# Patient Record
Sex: Female | Born: 1995 | Hispanic: No | Marital: Single | Smoking: Current every day smoker
Health system: Southern US, Community
[De-identification: ages and names within clinical notes are randomized; demographics above are authoritative.]

## PROBLEM LIST (undated history)

## (undated) ENCOUNTER — Inpatient Hospital Stay (HOSPITAL_COMMUNITY): Payer: Self-pay

## (undated) DIAGNOSIS — N760 Acute vaginitis: Secondary | ICD-10-CM

## (undated) DIAGNOSIS — F419 Anxiety disorder, unspecified: Secondary | ICD-10-CM

## (undated) DIAGNOSIS — I10 Essential (primary) hypertension: Secondary | ICD-10-CM

## (undated) DIAGNOSIS — O139 Gestational [pregnancy-induced] hypertension without significant proteinuria, unspecified trimester: Secondary | ICD-10-CM

## (undated) DIAGNOSIS — R519 Headache, unspecified: Secondary | ICD-10-CM

## (undated) DIAGNOSIS — B9689 Other specified bacterial agents as the cause of diseases classified elsewhere: Secondary | ICD-10-CM

## (undated) DIAGNOSIS — D649 Anemia, unspecified: Secondary | ICD-10-CM

## (undated) DIAGNOSIS — Z8619 Personal history of other infectious and parasitic diseases: Secondary | ICD-10-CM

## (undated) DIAGNOSIS — R51 Headache: Secondary | ICD-10-CM

## (undated) HISTORY — DX: Headache: R51

## (undated) HISTORY — DX: Headache, unspecified: R51.9

## (undated) HISTORY — DX: Anxiety disorder, unspecified: F41.9

---
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
## 2016-10-17 ENCOUNTER — Inpatient Hospital Stay (HOSPITAL_COMMUNITY)
Admission: AD | Admit: 2016-10-17 | Discharge: 2016-10-17 | Disposition: A | Discharge status: Home / Self Care | Payer: Medicaid Other | Source: Ambulatory Visit | Attending: Obstetrics and Gynecology | Admitting: Obstetrics and Gynecology

## 2016-10-17 ENCOUNTER — Encounter (HOSPITAL_COMMUNITY): Payer: Self-pay | Admitting: *Deleted

## 2016-10-17 DIAGNOSIS — Z3201 Encounter for pregnancy test, result positive: Secondary | ICD-10-CM | POA: Diagnosis not present

## 2016-10-17 DIAGNOSIS — M549 Dorsalgia, unspecified: Secondary | ICD-10-CM | POA: Insufficient documentation

## 2016-10-17 DIAGNOSIS — R110 Nausea: Secondary | ICD-10-CM

## 2016-10-17 DIAGNOSIS — R109 Unspecified abdominal pain: Secondary | ICD-10-CM | POA: Insufficient documentation

## 2016-10-17 DIAGNOSIS — R112 Nausea with vomiting, unspecified: Secondary | ICD-10-CM | POA: Insufficient documentation

## 2016-10-17 DIAGNOSIS — O219 Vomiting of pregnancy, unspecified: Secondary | ICD-10-CM

## 2016-10-17 DIAGNOSIS — Z87891 Personal history of nicotine dependence: Secondary | ICD-10-CM | POA: Diagnosis not present

## 2016-10-17 DIAGNOSIS — Z8619 Personal history of other infectious and parasitic diseases: Secondary | ICD-10-CM

## 2016-10-17 DIAGNOSIS — O26891 Other specified pregnancy related conditions, first trimester: Secondary | ICD-10-CM | POA: Diagnosis not present

## 2016-10-17 DIAGNOSIS — Z3A01 Less than 8 weeks gestation of pregnancy: Secondary | ICD-10-CM | POA: Insufficient documentation

## 2016-10-17 HISTORY — DX: Personal history of other infectious and parasitic diseases: Z86.19

## 2016-10-17 LAB — URINALYSIS, ROUTINE W REFLEX MICROSCOPIC
Bilirubin Urine: NEGATIVE
Glucose, UA: NEGATIVE mg/dL
KETONES UR: NEGATIVE mg/dL
LEUKOCYTES UA: NEGATIVE
Nitrite: NEGATIVE
PROTEIN: NEGATIVE mg/dL
Specific Gravity, Urine: 1.015 (ref 1.005–1.030)
pH: 6 (ref 5.0–8.0)

## 2016-10-17 LAB — POCT PREGNANCY, URINE: Preg Test, Ur: POSITIVE — AB

## 2016-10-17 NOTE — MAU Note (Signed)
[facility], received medication for chlamydia, had a lot of nausea, some vomiting.  Also had some brown spotting.  Having lower abd & back pain.  Pos UPT on Pregnancy Mobile Bus on Saturday.

## 2016-10-17 NOTE — MAU Provider Note (Signed)
  History     CSN: [id number]  Arrival date and time: 10/17/16 1856   None     Chief Complaint  Patient presents with  . Vaginal Bleeding  . Abdominal Pain  . Back Pain   HPI 21 yo G1P0 5 6/7 weeks by LMP. Was [facility] at [facility] today and treated for Chlamydia. Shortly after taking medication has abd pain, nausea and an episode of spotting. Pt took a nap and all her Sx have now resolved.  Pt denies any chronic medical problems or medications.   Past Medical History:  Diagnosis Date  . Medical history non-contributory     Past Surgical History:  Procedure Laterality Date  . NO PAST SURGERIES      History reviewed. No pertinent family history.  Social History  Substance Use Topics  . Smoking status: Former Games developermoker  . Smokeless tobacco: Never Used  . Alcohol use Not on file    Allergies: No Known Allergies  No prescriptions prior to admission.    Review of Systems  Constitutional: Negative.   Respiratory: Negative.   Cardiovascular: Negative.   Gastrointestinal: Negative.   Genitourinary: Negative.    Physical Exam   Blood pressure (!) 145/70, pulse 91, temperature 97.8 F (36.6 C), temperature source Oral, resp. rate 18, height 5\' 2"  (1.575 m), weight 97.1 kg (214 lb), last menstrual period 09/06/2016.  Physical Exam  Constitutional: She appears well-developed and well-nourished.  Cardiovascular: Normal rate, regular rhythm and normal heart sounds.   Respiratory: Effort normal and breath sounds normal.  GI: Soft. Bowel sounds are normal.  Genitourinary:  Genitourinary Comments: Nl EGBUS, scant white discharge, no bleeding noted, uterus < 6 weeks non tender no adnexal masses or tenderness    MAU Course  Procedures None    Assessment and Plan  N/V related to medication Pregnancy  Pt reassured. Sx have now resolved. Instructed to continue with PNV. List of OB providers given to pt.   Hermina StaggersMichael L Leiliana Foody 10/17/2016, 7:39 PM

## 2016-10-17 NOTE — Discharge Instructions (Signed)
First Trimester of Pregnancy °The first trimester of pregnancy is from week 1 until the end of week 13 (months 1 through 3). A week after a sperm fertilizes an egg, the egg will implant on the wall of the uterus. This embryo will begin to develop into a baby. Genes from you and your partner will form the baby. The female genes will determine whether the baby will be a boy or a girl. At 6-8 weeks, the eyes and face will be formed, and the heartbeat can be seen on ultrasound. At the end of 12 weeks, all the baby's organs will be formed. °Now that you are pregnant, you will want to do everything you can to have a healthy baby. Two of the most important things are to get good prenatal care and to follow your health care provider's instructions. Prenatal care is all the medical care you receive before the baby's birth. This care will help prevent, find, and treat any problems during the pregnancy and childbirth. °Body changes during your first trimester °Your body goes through many changes during pregnancy. The changes vary from woman to woman. °· You may gain or lose a couple of pounds at first. °· You may feel sick to your stomach (nauseous) and you may throw up (vomit). If the vomiting is uncontrollable, call your health care provider. °· You may tire easily. °· You may develop headaches that can be relieved by medicines. All medicines should be approved by your health care provider. °· You may urinate more often. Painful urination may mean you have a bladder infection. °· You may develop heartburn as a result of your pregnancy. °· You may develop constipation because certain hormones are causing the muscles that push stool through your intestines to slow down. °· You may develop hemorrhoids or swollen veins (varicose veins). °· Your breasts may begin to grow larger and become tender. Your nipples may stick out more, and the tissue that surrounds them (areola) may become darker. °· Your gums may bleed and may be  sensitive to brushing and flossing. °· Dark spots or blotches (chloasma, mask of pregnancy) may develop on your face. This will likely fade after the baby is born. °· Your menstrual periods will stop. °· You may have a loss of appetite. °· You may develop cravings for certain kinds of food. °· You may have changes in your emotions from day to day, such as being excited to be pregnant or being concerned that something may go wrong with the pregnancy and baby. °· You may have more vivid and strange dreams. °· You may have changes in your hair. These can include thickening of your hair, rapid growth, and changes in texture. Some women also have hair loss during or after pregnancy, or hair that feels dry or thin. Your hair will most likely return to normal after your baby is born. ° °What to expect at prenatal visits °During a routine prenatal visit: °· You will be weighed to make sure you and the baby are growing normally. °· Your blood pressure will be taken. °· Your abdomen will be measured to track your baby's growth. °· The fetal heartbeat will be listened to between weeks 10 and 14 of your pregnancy. °· Test results from any previous visits will be discussed. ° °Your health care provider may ask you: °· How you are feeling. °· If you are feeling the baby move. °· If you have had any abnormal symptoms, such as leaking fluid, bleeding, severe headaches,   or abdominal cramping. °· If you are using any tobacco products, including cigarettes, chewing tobacco, and electronic cigarettes. °· If you have any questions. ° °Other tests that may be performed during your first trimester include: °· Blood tests to find your blood type and to check for the presence of any previous infections. The tests will also be used to check for low iron levels (anemia) and protein on red blood cells (Rh antibodies). Depending on your risk factors, or if you previously had diabetes during pregnancy, you may have tests to check for high blood  sugar that affects pregnant women (gestational diabetes). °· Urine tests to check for infections, diabetes, or protein in the urine. °· An ultrasound to confirm the proper growth and development of the baby. °· Fetal screens for spinal cord problems (spina bifida) and Down syndrome. °· HIV (human immunodeficiency virus) testing. Routine prenatal testing includes screening for HIV, unless you choose not to have this test. °· You may need other tests to make sure you and the baby are doing well. ° °Follow these instructions at home: °Medicines °· Follow your health care provider's instructions regarding medicine use. Specific medicines may be either safe or unsafe to take during pregnancy. °· Take a prenatal vitamin that contains at least 600 micrograms (mcg) of folic acid. °· If you develop constipation, try taking a stool softener if your health care provider approves. °Eating and drinking °· Eat a balanced diet that includes fresh fruits and vegetables, whole grains, good sources of protein such as meat, eggs, or tofu, and low-fat dairy. Your health care provider will help you determine the amount of weight gain that is right for you. °· Avoid raw meat and uncooked cheese. These carry germs that can cause birth defects in the baby. °· Eating four or five small meals rather than three large meals a day may help relieve nausea and vomiting. If you start to feel nauseous, eating a few soda crackers can be helpful. Drinking liquids between meals, instead of during meals, also seems to help ease nausea and vomiting. °· Limit foods that are high in fat and processed sugars, such as fried and sweet foods. °· To prevent constipation: °? Eat foods that are high in fiber, such as fresh fruits and vegetables, whole grains, and beans. °? Drink enough fluid to keep your urine clear or pale yellow. °Activity °· Exercise only as directed by your health care provider. Most women can continue their usual exercise routine during  pregnancy. Try to exercise for 30 minutes at least 5 days a week. Exercising will help you: °? Control your weight. °? Stay in shape. °? Be prepared for labor and delivery. °· Experiencing pain or cramping in the lower abdomen or lower back is a good sign that you should stop exercising. Check with your health care provider before continuing with normal exercises. °· Try to avoid standing for long periods of time. Move your legs often if you must stand in one place for a long time. °· Avoid heavy lifting. °· Wear low-heeled shoes and practice good posture. °· You may continue to have sex unless your health care provider tells you not to. °Relieving pain and discomfort °· Wear a good support bra to relieve breast tenderness. °· Take warm sitz baths to soothe any pain or discomfort caused by hemorrhoids. Use hemorrhoid cream if your health care provider approves. °· Rest with your legs elevated if you have leg cramps or low back pain. °· If you develop   varicose veins in your legs, wear support hose. Elevate your feet for 15 minutes, 3-4 times a day. Limit salt in your diet. Prenatal care  Schedule your prenatal visits by the twelfth week of pregnancy. They are usually scheduled monthly at first, then more often in the last 2 months before delivery.  Write down your questions. Take them to your prenatal visits.  Keep all your prenatal visits as told by your health care provider. This is important. Safety  Wear your seat belt at all times when driving.  Make a list of emergency phone numbers, including numbers for family, friends, the hospital, and police and fire departments. General instructions  Ask your health care provider for a referral to a local prenatal education class. Begin classes no later than the beginning of month 6 of your pregnancy.  Ask for help if you have counseling or nutritional needs during pregnancy. Your health care provider can offer advice or refer you to specialists for help  with various needs.  Do not use hot tubs, steam rooms, or saunas.  Do not douche or use tampons or scented sanitary pads.  Do not cross your legs for long periods of time.  Avoid cat litter boxes and soil used by cats. These carry germs that can cause birth defects in the baby and possibly loss of the fetus by miscarriage or stillbirth.  Avoid all smoking, herbs, alcohol, and medicines not prescribed by your health care provider. Chemicals in these products affect the formation and growth of the baby.  Do not use any products that contain nicotine or tobacco, such as cigarettes and e-cigarettes. If you need help quitting, ask your health care provider. You may receive counseling support and other resources to help you quit.  Schedule a dentist appointment. At home, brush your teeth with a soft toothbrush and be gentle when you floss. Contact a health care provider if:  You have dizziness.  You have mild pelvic cramps, pelvic pressure, or nagging pain in the abdominal area.  You have persistent nausea, vomiting, or diarrhea.  You have a bad smelling vaginal discharge.  You have pain when you urinate.  You notice increased swelling in your face, hands, legs, or ankles.  You are exposed to fifth disease or chickenpox.  You are exposed to [place] measles (rubella) and have never had it. Get help right away if:  You have a fever.  You are leaking fluid from your vagina.  You have spotting or bleeding from your vagina.  You have severe abdominal cramping or pain.  You have rapid weight gain or loss.  You vomit blood or material that looks like coffee grounds.  You develop a severe headache.  You have shortness of breath.  You have any kind of trauma, such as from a fall or a car accident. Summary  The first trimester of pregnancy is from week 1 until the end of week 13 (months 1 through 3).  Your body goes through many changes during pregnancy. The changes vary from  woman to woman.  You will have routine prenatal visits. During those visits, your health care provider will examine you, discuss any test results you may have, and talk with you about how you are feeling. This information is not intended to replace advice given to you by your health care provider. Make sure you discuss any questions you have with your health care provider. Document Released: 05/01/2001 Document Revised: 04/18/2016 Document Reviewed: 04/18/2016 Elsevier Interactive Patient Education  2017 Elsevier  Inc. Morning Sickness Morning sickness is when you feel sick to your stomach (nauseous) during pregnancy. This nauseous feeling may or may not come with vomiting. It often occurs in the morning but can be a problem any time of day. Morning sickness is most common during the first trimester, but it may continue throughout pregnancy. While morning sickness is unpleasant, it is usually harmless unless you develop severe and continual vomiting (hyperemesis gravidarum). This condition requires more intense treatment. What are the causes? The cause of morning sickness is not completely known but seems to be related to normal hormonal changes that occur in pregnancy. What increases the risk? You are at greater risk if you:  Experienced nausea or vomiting before your pregnancy.  Had morning sickness during a previous pregnancy.  Are pregnant with more than one baby, such as twins.  How is this treated? Do not use any medicines (prescription, over-the-counter, or herbal) for morning sickness without first talking to your health care provider. Your health care provider may prescribe or recommend:  Vitamin B6 supplements.  Anti-nausea medicines.  The herbal medicine ginger.  Follow these instructions at home:  Only take over-the-counter or prescription medicines as directed by your health care provider.  Taking multivitamins before getting pregnant can prevent or decrease the severity  of morning sickness in most women.  Eat a piece of dry toast or unsalted crackers before getting out of bed in the morning.  Eat five or six small meals a day.  Eat dry and bland foods (rice, baked potato). Foods high in carbohydrates are often helpful.  Do not drink liquids with your meals. Drink liquids between meals.  Avoid greasy, fatty, and spicy foods.  Get someone to cook for you if the smell of any food causes nausea and vomiting.  If you feel nauseous after taking prenatal vitamins, take the vitamins at night or with a snack.  Snack on protein foods (nuts, yogurt, cheese) between meals if you are hungry.  Eat unsweetened gelatins for desserts.  Wearing an acupressure wristband (worn for sea sickness) may be helpful.  Acupuncture may be helpful.  Do not smoke.  Get a humidifier to keep the air in your house free of odors.  Get plenty of fresh air. Contact a health care provider if:  Your home remedies are not working, and you need medicine.  You feel dizzy or lightheaded.  You are losing weight. Get help right away if:  You have persistent and uncontrolled nausea and vomiting.  You pass out (faint). This information is not intended to replace advice given to you by your health care provider. Make sure you discuss any questions you have with your health care provider. Document Released: 06/28/2006 Document Revised: 10/13/2015 Document Reviewed: 10/22/2012 Elsevier Interactive Patient Education  2017 [employer]

## 2016-10-21 ENCOUNTER — Encounter (HOSPITAL_COMMUNITY): Payer: Self-pay

## 2016-10-21 ENCOUNTER — Inpatient Hospital Stay (HOSPITAL_COMMUNITY): Payer: Medicaid Other

## 2016-10-21 ENCOUNTER — Inpatient Hospital Stay (HOSPITAL_COMMUNITY)
Admission: AD | Admit: 2016-10-21 | Discharge: 2016-10-21 | Disposition: A | Discharge status: Home / Self Care | Payer: Medicaid Other | Source: Ambulatory Visit | Attending: Obstetrics and Gynecology | Admitting: Obstetrics and Gynecology

## 2016-10-21 DIAGNOSIS — O26892 Other specified pregnancy related conditions, second trimester: Secondary | ICD-10-CM | POA: Insufficient documentation

## 2016-10-21 DIAGNOSIS — R109 Unspecified abdominal pain: Secondary | ICD-10-CM | POA: Diagnosis not present

## 2016-10-21 DIAGNOSIS — Z87891 Personal history of nicotine dependence: Secondary | ICD-10-CM | POA: Diagnosis not present

## 2016-10-21 DIAGNOSIS — O30002 Twin pregnancy, unspecified number of placenta and unspecified number of amniotic sacs, second trimester: Secondary | ICD-10-CM | POA: Diagnosis not present

## 2016-10-21 DIAGNOSIS — Z3A01 Less than 8 weeks gestation of pregnancy: Secondary | ICD-10-CM | POA: Diagnosis not present

## 2016-10-21 DIAGNOSIS — R52 Pain, unspecified: Secondary | ICD-10-CM

## 2016-10-21 DIAGNOSIS — O209 Hemorrhage in early pregnancy, unspecified: Secondary | ICD-10-CM | POA: Insufficient documentation

## 2016-10-21 DIAGNOSIS — O30001 Twin pregnancy, unspecified number of placenta and unspecified number of amniotic sacs, first trimester: Secondary | ICD-10-CM

## 2016-10-21 DIAGNOSIS — A749 Chlamydial infection, unspecified: Secondary | ICD-10-CM

## 2016-10-21 LAB — URINALYSIS, ROUTINE W REFLEX MICROSCOPIC
Bilirubin Urine: NEGATIVE
GLUCOSE, UA: NEGATIVE mg/dL
KETONES UR: NEGATIVE mg/dL
Leukocytes, UA: NEGATIVE
Nitrite: NEGATIVE
PROTEIN: NEGATIVE mg/dL
Specific Gravity, Urine: 1.025 (ref 1.005–1.030)
pH: 6 (ref 5.0–8.0)

## 2016-10-21 LAB — CBC
HCT: 35.7 % — ABNORMAL LOW (ref 36.0–46.0)
Hemoglobin: 12.3 g/dL (ref 12.0–15.0)
MCH: 31.3 pg (ref 26.0–34.0)
MCHC: 34.5 g/dL (ref 30.0–36.0)
MCV: 90.8 fL (ref 78.0–100.0)
Platelets: 171 10*3/uL (ref 150–400)
RBC: 3.93 MIL/uL (ref 3.87–5.11)
RDW: 13.8 % (ref 11.5–15.5)
WBC: 7.7 10*3/uL (ref 4.0–10.5)

## 2016-10-21 LAB — URINALYSIS, MICROSCOPIC (REFLEX): BACTERIA UA: NONE SEEN

## 2016-10-21 LAB — WET PREP, GENITAL
Clue Cells Wet Prep HPF POC: NONE SEEN
Sperm: NONE SEEN
Trich, Wet Prep: NONE SEEN
WBC, Wet Prep HPF POC: NONE SEEN
Yeast Wet Prep HPF POC: NONE SEEN

## 2016-10-21 LAB — HCG, QUANTITATIVE, PREGNANCY: HCG, BETA CHAIN, QUANT, S: 43999 m[IU]/mL — AB (ref <5)

## 2016-10-21 LAB — ABO/RH: ABO/RH(D): AB POS

## 2016-10-21 NOTE — MAU Note (Signed)
Patient presents with vaginal bleeding upon awakening this morning like a period, having cramping, was seen 2 days ago in MAU, has not had an ultrasound this pregnancy.

## 2016-10-21 NOTE — MAU Provider Note (Signed)
Patient Jackie Chavez is a 21 y.o. G1P0 at 4339w3d here with complaints of bleeding that started this morning. She was recently treated for chlamydia, and see in the MAU on 10-17-2016 for N/v, pain and spotting after her chlamydia medication.  History     CSN: [id number]  Arrival date and time: 10/21/16 0904   None     Chief Complaint  Patient presents with  . Abdominal Cramping  . Vaginal Bleeding   Vaginal Bleeding  The patient's primary symptoms include vaginal bleeding. The patient's pertinent negatives include no genital itching or missed menses. This is a new problem. The current episode started today. The problem occurs rarely. The problem has been unchanged. Pertinent negatives include no abdominal pain or fever.    OB History    Gravida Para Term Preterm AB Living   1             SAB TAB Ectopic Multiple Live Births                  Past Medical History:  Diagnosis Date  . Medical history non-contributory     Past Surgical History:  Procedure Laterality Date  . NO PAST SURGERIES      History reviewed. No pertinent family history.  Social History  Substance Use Topics  . Smoking status: Former Games developermoker  . Smokeless tobacco: Never Used  . Alcohol use Not on file    Allergies: No Known Allergies  No prescriptions prior to admission.    Review of Systems  Constitutional: Negative for fever.  Gastrointestinal: Negative for abdominal pain.  Genitourinary: Positive for vaginal bleeding. Negative for missed menses.   Physical Exam   Blood pressure (!) 132/59, pulse 88, temperature 98 F (36.7 C), resp. rate 18, last menstrual period 09/06/2016.  Physical Exam  Constitutional: She is oriented to person, place, and time. She appears well-developed and well-nourished.  HENT:  Head: Normocephalic.  Neck: Normal range of motion.  Respiratory: Effort normal.  GI: Soft.  Genitourinary: Vagina normal.  Genitourinary Comments: NEFG; no lesions on vaginal walls  or cevix. No blood or discharge int eh vagina. No CMT, suprapubic or adnexal tenderness.   Neurological: She is alert and oriented to person, place, and time. She has normal reflexes.  Skin: Skin is warm and dry.  Psychiatric: She has a normal mood and affect.    MAU Course  Procedures  MDM -beta -ABO, CBC [place] Ob Comp Less 14 Wks  Result Date: 10/21/2016 CLINICAL DATA:  Early pregnancy with bleeding. EXAM: OBSTETRIC <14 WK US AND TRANSVAGINAL OB US TECHNIQUE: Both transabdominal and transvaginal ultrasound examinations were performed for complete evaluation of the gestation as well as the maternal uterus, adnexal regions, and pelvic cul-de-sac. Transvaginal technique was performed to assess early pregnancy. COMPARISON:  None. FINDINGS: Intrauterine gestational sac: Twin gestation Yolk sac:  Both present Embryo:  Both present Cardiac Activity: Present in both Heart Rate:  Fetus 1 93  bpm.  Fetus 2 109 BPM CRL 1:  4.1 mm CRL 2:  2.8 mm US EDC: 06/15/2017 Subchorionic hemorrhage:  None visualized. Maternal uterus/adnexae: No abnormality seen IMPRESSION: Early twin gestation ,normal in appearance. Cardiac activity noted with respect to each fetus. Crown-rump lengths 4.1 mm and 2.8 mm. Electronically Signed   By: Paulina FusiMark  Shogry M.D.   On: 10/21/2016 10:55   [place] Ob Comp Addl Gest Less 14 Wks  Result Date: 10/21/2016 CLINICAL DATA:  Early pregnancy with bleeding. EXAM: OBSTETRIC <14 WK US AND  TRANSVAGINAL OB US TECHNIQUE: Both transabdominal and transvaginal ultrasound examinations were performed for complete evaluation of the gestation as well as the maternal uterus, adnexal regions, and pelvic cul-de-sac. Transvaginal technique was performed to assess early pregnancy. COMPARISON:  None. FINDINGS: Intrauterine gestational sac: Twin gestation Yolk sac:  Both present Embryo:  Both present Cardiac Activity: Present in both Heart Rate:  Fetus 1 93  bpm.  Fetus 2 109 BPM CRL 1:  4.1 mm CRL 2:  2.8 mm [place] EDC:  06/15/2017 Subchorionic hemorrhage:  None visualized. Maternal uterus/adnexae: No abnormality seen IMPRESSION: Early twin gestation ,normal in appearance. Cardiac activity noted with respect to each fetus. Crown-rump lengths 4.1 mm and 2.8 mm. Electronically Signed   By: Paulina Fusi M.D.   On: 10/21/2016 10:55   US Ob Transvaginal  Result Date: 10/21/2016 CLINICAL DATA:  Early pregnancy with bleeding. EXAM: OBSTETRIC <14 WK [place] AND TRANSVAGINAL OB US TECHNIQUE: Both transabdominal and transvaginal ultrasound examinations were performed for complete evaluation of the gestation as well as the maternal uterus, adnexal regions, and pelvic cul-de-sac. Transvaginal technique was performed to assess early pregnancy. COMPARISON:  None. FINDINGS: Intrauterine gestational sac: Twin gestation Yolk sac:  Both present Embryo:  Both present Cardiac Activity: Present in both Heart Rate:  Fetus 1 93  bpm.  Fetus 2 109 BPM CRL 1:  4.1 mm CRL 2:  2.8 mm [place] EDC: 06/15/2017 Subchorionic hemorrhage:  None visualized. Maternal uterus/adnexae: No abnormality seen IMPRESSION: Early twin gestation ,normal in appearance. Cardiac activity noted with respect to each fetus. Crown-rump lengths 4.1 mm and 2.8 mm. Electronically Signed   By: Paulina Fusi M.D.   On: 10/21/2016 10:55     Assessment and Plan   1. Twin gestation in first trimester, unspecified multiple gestation type   2. Bleeding in early pregnancy   3. Pain   4. Vaginal bleeding in pregnancy, first trimester    2. Patient stable for discharge 3. Reviewed warning signs to return to MAU. Patient has list of providers to begin prenatal care.    Charlesetta Garibaldi Clint Biello CNM 10/21/2016, 10:04 AM

## 2016-10-21 NOTE — Discharge Instructions (Signed)
Multiple Pregnancy Having a multiple pregnancy means that a woman is carrying more than one baby at a time. She may be pregnant with twins, triplets, or more. The majority of multiple pregnancies are twins. Naturally conceiving triplets or more (higher-order multiples) is rare. Multiple pregnancies are riskier than single pregnancies. A woman with a multiple pregnancy is more likely to have certain problems during her pregnancy. Therefore, she will need to have more frequent appointments for prenatal care. How does a multiple pregnancy happen? A multiple pregnancy happens when:  The woman's body releases more than one egg at a time, and then each egg gets fertilized by a different sperm. ? This is the most common type of multiple pregnancy. ? Twins or other multiples produced this way are fraternal. They are no more alike than non-multiple siblings are.  One sperm fertilizes one egg, which then divides into more than one embryo. ? Twins or other multiples produced this way are identical. Identical multiples are always the same gender, and they look very much alike.  Who is most likely to have a multiple pregnancy? A multiple pregnancy is more likely to develop in women who:  Have had fertility treatment, especially if the treatment included fertility drugs.  Are older than 20 years of age.  Have already had four or more children.  Have a family history of multiple pregnancy.  How is a multiple pregnancy diagnosed? A multiple pregnancy may be diagnosed based on:  Symptoms such as: ? Rapid weight gain in the first 3 months of pregnancy (first trimester). ? More severe nausea and breast tenderness than what is typical of a single pregnancy. ? The uterus measuring larger than what is normal for the stage of the pregnancy.  Blood tests that detect a higher-than-normal level of human chorionic gonadotropin (hCG). This is a hormone that your body produces in early pregnancy.  Ultrasound  exam. This is used to confirm that you are carrying multiples.  What risks are associated with multiple pregnancy? A multiple pregnancy puts you at a higher risk for certain problems during or after your pregnancy, including:  Having your babies delivered before you have reached a full-term pregnancy (preterm birth). A full-term pregnancy lasts for at least 37 weeks. Babies born before 37 weeks may have a higher risk of a variety of health problems, such as breathing problems, feeding difficulties, cerebral palsy, and learning disabilities.  Diabetes.  Preeclampsia. This is a serious condition that causes high blood pressure along with other symptoms, such as swelling and headaches, during pregnancy.  Excessive blood loss after childbirth (postpartum hemorrhage).  Postpartum depression.  Low birth weight of the babies.  How will having a multiple pregnancy affect my care? Your health care provider will want to monitor you more closely during your pregnancy to make sure that your babies are growing normally and that you are healthy. Follow these instructions at home: Because your pregnancy is considered to be high risk, you will need to work closely with your health care team. You may also need to make some lifestyle changes. These may include the following: Eating and drinking  Increase your nutrition. ? Follow your health care provider's recommendations for weight gain. You may need to gain a little extra weight when you are pregnant with multiples. ? Eat healthy snacks often throughout the day. This can add calories and reduce nausea.  Drink enough fluid to keep your urine clear or pale yellow.  Take prenatal vitamins. Activity By 20-24 weeks, you may   need to limit your activities.  Avoid activities and work that take a lot of effort (are strenuous).  Ask your health care provider when you should stop having sexual intercourse.  Rest often.  General instructions  Do not use  any products that contain nicotine or tobacco, such as cigarettes and e-cigarettes. If you need help quitting, ask your health care provider.  Do not drink alcohol or use illegal drugs.  Take over-the-counter and prescription medicines only as told by your health care provider.  Arrange for extra help around the house.  Keep all follow-up visits and all prenatal visits as told by your health care provider. This is important. Contact a health care provider if:  You have dizziness.  You have persistent nausea, vomiting, or diarrhea.  You are having trouble gaining weight.  You have feelings of depression or other emotions that are interfering with your normal activities. Get help right away if:  You have a fever.  You have pain with urination.  You have fluid leaking from your vagina.  You have a bad-smelling vaginal discharge.  You notice increased swelling in your face, hands, legs, or ankles.  You have spotting or bleeding from your vagina.  You have pelvic cramps, pelvic pressure, or nagging pain in your abdomen or lower back.  You are having regular contractions.  You develop a severe headache, with or without visual changes.  You have shortness of breath or chest pain.  You notice less fetal movement, or no fetal movement. This information is not intended to replace advice given to you by your health care provider. Make sure you discuss any questions you have with your health care provider. Document Released: 02/14/2008 Document Revised: 01/06/2016 Document Reviewed: 01/06/2016 Elsevier Interactive Patient Education  2018 Elsevier Inc.  

## 2016-10-22 LAB — GC/CHLAMYDIA PROBE AMP (~~LOC~~) NOT AT ARMC
CHLAMYDIA, DNA PROBE: POSITIVE — AB
Neisseria Gonorrhea: NEGATIVE

## 2016-10-24 ENCOUNTER — Telehealth: Payer: Self-pay | Admitting: Obstetrics and Gynecology

## 2016-10-24 NOTE — Telephone Encounter (Signed)
Spoke to the patient Patient was seen on 5/30 in [facility] and treated for chlamydia.  She was seen again on 6/3 and had cultures collected, Chlamydia positive, although we would expect it to be positive since she was just treated on 5/30. Questions answered  Kylie Simmonds, Harolyn RutherfordJennifer I, NP

## 2016-11-26 ENCOUNTER — Encounter: Payer: Medicaid Other | Admitting: Certified Nurse Midwife

## 2016-11-28 ENCOUNTER — Inpatient Hospital Stay (HOSPITAL_COMMUNITY)
Admission: AD | Admit: 2016-11-28 | Discharge: 2016-11-28 | Disposition: A | Discharge status: Home / Self Care | Payer: Medicaid Other | Source: Ambulatory Visit | Attending: Obstetrics and Gynecology | Admitting: Obstetrics and Gynecology

## 2016-11-28 ENCOUNTER — Encounter (HOSPITAL_COMMUNITY): Payer: Self-pay | Admitting: Student

## 2016-11-28 DIAGNOSIS — N760 Acute vaginitis: Secondary | ICD-10-CM | POA: Diagnosis not present

## 2016-11-28 DIAGNOSIS — O26891 Other specified pregnancy related conditions, first trimester: Secondary | ICD-10-CM | POA: Diagnosis present

## 2016-11-28 DIAGNOSIS — B9689 Other specified bacterial agents as the cause of diseases classified elsewhere: Secondary | ICD-10-CM

## 2016-11-28 DIAGNOSIS — R109 Unspecified abdominal pain: Secondary | ICD-10-CM | POA: Insufficient documentation

## 2016-11-28 DIAGNOSIS — Z3A11 11 weeks gestation of pregnancy: Secondary | ICD-10-CM | POA: Insufficient documentation

## 2016-11-28 DIAGNOSIS — O30001 Twin pregnancy, unspecified number of placenta and unspecified number of amniotic sacs, first trimester: Secondary | ICD-10-CM | POA: Diagnosis not present

## 2016-11-28 DIAGNOSIS — O23591 Infection of other part of genital tract in pregnancy, first trimester: Secondary | ICD-10-CM | POA: Diagnosis not present

## 2016-11-28 DIAGNOSIS — O209 Hemorrhage in early pregnancy, unspecified: Secondary | ICD-10-CM | POA: Diagnosis not present

## 2016-11-28 DIAGNOSIS — Z87891 Personal history of nicotine dependence: Secondary | ICD-10-CM | POA: Diagnosis not present

## 2016-11-28 DIAGNOSIS — Z3491 Encounter for supervision of normal pregnancy, unspecified, first trimester: Secondary | ICD-10-CM

## 2016-11-28 HISTORY — DX: Personal history of other infectious and parasitic diseases: Z86.19

## 2016-11-28 LAB — URINALYSIS, ROUTINE W REFLEX MICROSCOPIC
BILIRUBIN URINE: NEGATIVE
Glucose, UA: NEGATIVE mg/dL
HGB URINE DIPSTICK: NEGATIVE
KETONES UR: NEGATIVE mg/dL
Leukocytes, UA: NEGATIVE
NITRITE: NEGATIVE
PH: 7 (ref 5.0–8.0)
Protein, ur: NEGATIVE mg/dL
SPECIFIC GRAVITY, URINE: 1.011 (ref 1.005–1.030)

## 2016-11-28 LAB — WET PREP, GENITAL
SPERM: NONE SEEN
Trich, Wet Prep: NONE SEEN
Yeast Wet Prep HPF POC: NONE SEEN

## 2016-11-28 MED ORDER — METRONIDAZOLE 500 MG PO TABS: 500.0000 mg | ORAL_TABLET | Freq: Two times a day (BID) | ORAL | 0 refills | Status: DC

## 2016-11-28 NOTE — [facility] Provider Note (Signed)
History     CSN: [id number]  Arrival date and time: 11/28/16 1118  First Provider Initiated Contact with Patient 11/28/16 1156      Chief Complaint  Patient presents with  . Abdominal Pain  . Vaginal Bleeding   HPI  Jackie Chavez is a 21 y.o. G1P0 at [redacted]w[redacted]d with twins who presents with abdominal pain & vaginal bleeding. Symptoms began last night. Reports lower abdominal cramping last night that resolved this morning without intervention. Noted pink spotting & passed 1 small red clot last night. Vaginal bleeding has not continued. Endorses nausea & vomiting; vomits at least once per day since early in pregnancy. Reports vaginal discharge that is yellow & malodorous. Denies dysuria, diarrhea, constipation, or recent intercourse. Patient was treated for chlamydia in May; has not had intercourse since then.  Scheduled for [facility] with [facility].  OB History    Gravida Para Term Preterm AB Living   1             SAB TAB Ectopic Multiple Live Births                  Past Medical History:  Diagnosis Date  . Hx of chlamydia infection 10/17/2016    Past Surgical History:  Procedure Laterality Date  . NO PAST SURGERIES      History reviewed. No pertinent family history.  Social History  Substance Use Topics  . Smoking status: Former Games developer  . Smokeless tobacco: Never Used  . Alcohol use No    Allergies: No Known Allergies  Prescriptions Prior to Admission  Medication Sig Dispense Refill Last Dose  . Prenatal Vit-Fe Fumarate-FA (PRENATAL MULTIVITAMIN) TABS tablet Take 1 tablet by mouth daily.   10/20/2016 at Unknown time    Review of Systems  Constitutional: Negative.   Gastrointestinal: Positive for abdominal pain (none today), nausea and vomiting. Negative for constipation and diarrhea.  Genitourinary: Positive for vaginal bleeding (x 1 episode last night) and vaginal discharge. Negative for dysuria.   Physical Exam   Blood pressure 129/72, pulse 83, temperature 98.2 F  (36.8 C), temperature source Oral, resp. rate 16, height 5\' 2"  (1.575 m), weight 215 lb (97.5 kg), last menstrual period 09/06/2016.  Physical Exam  Nursing note and vitals reviewed. Constitutional: She is oriented to person, place, and time. She appears well-developed and well-nourished. No distress.  HENT:  Head: Normocephalic and atraumatic.  Eyes: Conjunctivae are normal. Right eye exhibits no discharge. Left eye exhibits no discharge. No scleral icterus.  Neck: Normal range of motion.  Respiratory: Effort normal. No respiratory distress.  GI: Soft. There is no tenderness.  Genitourinary: Cervix exhibits no motion tenderness and no friability. No bleeding in the vagina. Vaginal discharge (minimal amount of thin yellow discharge) found.  Genitourinary Comments: Cervix closed/thick  Neurological: She is alert and oriented to person, place, and time.  Skin: Skin is warm and dry. She is not diaphoretic.  Psychiatric: She has a normal mood and affect. Her behavior is normal. Judgment and thought content normal.    MAU Course  Procedures Results for orders placed or performed during the hospital encounter of 11/28/16 (from the past 24 hour(s))  Urinalysis, Routine w reflex microscopic     Status: None   Collection Time: 11/28/16 11:32 AM  Result Value Ref Range   Color, Urine YELLOW YELLOW   APPearance CLEAR CLEAR   Specific Gravity, Urine 1.011 1.005 - 1.030   pH 7.0 5.0 - 8.0   Glucose, UA NEGATIVE NEGATIVE  mg/dL   Hgb urine dipstick NEGATIVE NEGATIVE   Bilirubin Urine NEGATIVE NEGATIVE   Ketones, ur NEGATIVE NEGATIVE mg/dL   Protein, ur NEGATIVE NEGATIVE mg/dL   Nitrite NEGATIVE NEGATIVE   Leukocytes, UA NEGATIVE NEGATIVE  Wet prep, genital     Status: Abnormal   Collection Time: 11/28/16 12:10 PM  Result Value Ref Range   Yeast Wet Prep HPF POC NONE SEEN NONE SEEN   Trich, Wet Prep NONE SEEN NONE SEEN   Clue Cells Wet Prep HPF POC PRESENT (A) NONE SEEN   WBC, Wet Prep HPF  POC FEW (A) NONE SEEN   Sperm NONE SEEN     MDM FHT x 2 confirmed by informal ultrasound, 167/148 Cervix closed & thick GC/CT & wet prep Patient asymptomatic at this time AB positive Assessment and Plan  A: 1. BV (bacterial vaginosis)   2. Fetal heart tones present, first trimester   3. Twin gestation in first trimester, unspecified multiple gestation type    P: Discharge home Rx flagyl Discussed reasons to return to [facility]U Keep follow up appointment with OB/PCP  GC/CT pending   Judeth Hornrin Lorenso Quirino 11/28/2016, 11:55 AM

## 2016-11-28 NOTE — Discharge Instructions (Signed)
Bacterial Vaginosis Bacterial vaginosis is a vaginal infection that occurs when the normal balance of bacteria in the vagina is disrupted. It results from an overgrowth of certain bacteria. This is the most common vaginal infection among women ages 15-44. Because bacterial vaginosis increases your risk for STIs (sexually transmitted infections), getting treated can help reduce your risk for chlamydia, gonorrhea, herpes, and HIV (human immunodeficiency virus). Treatment is also important for preventing complications in pregnant women, because this condition can cause an early (premature) delivery. What are the causes? This condition is caused by an increase in harmful bacteria that are normally present in small amounts in the vagina. However, the reason that the condition develops is not fully understood. What increases the risk? The following factors may make you more likely to develop this condition:  Having a new sexual partner or multiple sexual partners.  Having unprotected sex.  Douching.  Having an intrauterine device (IUD).  Smoking.  Drug and alcohol abuse.  Taking certain antibiotic medicines.  Being pregnant.  You cannot get bacterial vaginosis from toilet seats, bedding, swimming pools, or contact with objects around you. What are the signs or symptoms? Symptoms of this condition include:  Grey or white vaginal discharge. The discharge can also be watery or foamy.  A fish-like odor with discharge, especially after sexual intercourse or during menstruation.  Itching in and around the vagina.  Burning or pain with urination.  Some women with bacterial vaginosis have no signs or symptoms. How is this diagnosed? This condition is diagnosed based on:  Your medical history.  A physical exam of the vagina.  Testing a sample of vaginal fluid under a microscope to look for a large amount of bad bacteria or abnormal cells. Your health care provider may use a cotton swab  or a small wooden spatula to collect the sample.  How is this treated? This condition is treated with antibiotics. These may be given as a pill, a vaginal cream, or a medicine that is put into the vagina (suppository). If the condition comes back after treatment, a second round of antibiotics may be needed. Follow these instructions at home: Medicines  Take over-the-counter and prescription medicines only as told by your health care provider.  Take or use your antibiotic as told by your health care provider. Do not stop taking or using the antibiotic even if you start to feel better. General instructions  If you have a female sexual partner, tell her that you have a vaginal infection. She should see her health care provider and be treated if she has symptoms. If you have a female sexual partner, he does not need treatment.  During treatment: ? Avoid sexual activity until you finish treatment. ? Do not douche. ? Avoid alcohol as directed by your health care provider. ? Avoid breastfeeding as directed by your health care provider.  Drink enough water and fluids to keep your urine clear or pale yellow.  Keep the area around your vagina and rectum clean. ? Wash the area daily with warm water. ? Wipe yourself from front to back after using the toilet.  Keep all follow-up visits as told by your health care provider. This is important. How is this prevented?  Do not douche.  Wash the outside of your vagina with warm water only.  Use protection when having sex. This includes latex condoms and dental dams.  Limit how many sexual partners you have. To help prevent bacterial vaginosis, it is best to have sex with just   one partner (monogamous).  Make sure you and your sexual partner are tested for STIs.  Wear cotton or cotton-lined underwear.  Avoid wearing tight pants and pantyhose, especially during summer.  Limit the amount of alcohol that you drink.  Do not use any products that  contain nicotine or tobacco, such as cigarettes and e-cigarettes. If you need help quitting, ask your health care provider.  Do not use illegal drugs. Where to find more information:  Centers for Disease Control and Prevention: www.cdc.gov/std  American Sexual Health Association (ASHA): www.ashastd.org  U.S. Department of Health and Human Services, Office on Women's Health: www.womenshealth.gov/ or https://www.womenshealth.gov/a-z-topics/bacterial-vaginosis Contact a health care provider if:  Your symptoms do not improve, even after treatment.  You have more discharge or pain when urinating.  You have a fever.  You have pain in your abdomen.  You have pain during sex.  You have vaginal bleeding between periods. Summary  Bacterial vaginosis is a vaginal infection that occurs when the normal balance of bacteria in the vagina is disrupted.  Because bacterial vaginosis increases your risk for STIs (sexually transmitted infections), getting treated can help reduce your risk for chlamydia, gonorrhea, herpes, and HIV (human immunodeficiency virus). Treatment is also important for preventing complications in pregnant women, because the condition can cause an early (premature) delivery.  This condition is treated with antibiotic medicines. These may be given as a pill, a vaginal cream, or a medicine that is put into the vagina (suppository). This information is not intended to replace advice given to you by your health care provider. Make sure you discuss any questions you have with your health care provider. Document Released: 05/07/2005 Document Revised: 01/21/2016 Document Reviewed: 01/21/2016 Elsevier Interactive Patient Education  2017 Elsevier Inc.  

## 2016-11-28 NOTE — [facility]U Note (Signed)
Pt had severe cramping yesterday, not as bad today.  Noted small amount of bleeding during the night, happened with wiping, had a small clot in her underwear.  Hasn't bled this morning.

## 2016-11-29 LAB — GC/CHLAMYDIA PROBE AMP (~~LOC~~) NOT AT ARMC
Chlamydia: NEGATIVE
NEISSERIA GONORRHEA: NEGATIVE

## 2016-12-18 ENCOUNTER — Other Ambulatory Visit (HOSPITAL_COMMUNITY)
Admission: RE | Admit: 2016-12-18 | Discharge: 2016-12-18 | Disposition: A | Discharge status: Home / Self Care | Payer: Medicaid Other | Source: Ambulatory Visit | Attending: Certified Nurse Midwife | Admitting: Certified Nurse Midwife

## 2016-12-18 ENCOUNTER — Ambulatory Visit (INDEPENDENT_AMBULATORY_CARE_PROVIDER_SITE_OTHER): Payer: Medicaid Other | Admitting: Certified Nurse Midwife

## 2016-12-18 ENCOUNTER — Encounter: Payer: Self-pay | Admitting: Certified Nurse Midwife

## 2016-12-18 VITALS — BP 130/70 | HR 99 | Temp 97.1°F | Wt 219.0 lb

## 2016-12-18 DIAGNOSIS — O0990 Supervision of high risk pregnancy, unspecified, unspecified trimester: Secondary | ICD-10-CM

## 2016-12-18 DIAGNOSIS — O0992 Supervision of high risk pregnancy, unspecified, second trimester: Secondary | ICD-10-CM

## 2016-12-18 NOTE — Progress Notes (Signed)
Patient presents for NOB visit. Twin pregnancy

## 2016-12-19 ENCOUNTER — Encounter: Payer: Self-pay | Admitting: Certified Nurse Midwife

## 2016-12-19 LAB — CERVICOVAGINAL ANCILLARY ONLY
Bacterial vaginitis: NEGATIVE
CANDIDA VAGINITIS: NEGATIVE
CHLAMYDIA, DNA PROBE: NEGATIVE
NEISSERIA GONORRHEA: NEGATIVE
Trichomonas: NEGATIVE

## 2016-12-19 MED ORDER — PRENATE PIXIE 10-0.6-0.4-200 MG PO CAPS: 1.0000 | ORAL_CAPSULE | Freq: Every day | ORAL | 12 refills | Status: DC

## 2016-12-19 NOTE — Addendum Note (Signed)
Addended by: DENNEY, RACHELLE A on: 12/19/2016 09:46 AM   Modules accepted: Orders  

## 2016-12-19 NOTE — Progress Notes (Signed)
Subjective:    Jackie Chavez is being seen today for her first obstetrical visit.  This is not a planned pregnancy. She is at 5174w6d gestation. Her obstetrical history is significant for twin pregnancy diagnosed early in pregnancy, Hx of Chlamydia. Relationship with FOB: significant other, not living together. Patient does intend to breast feed. Pregnancy history fully reviewed.  Dating by early US.   The information documented in the HPI was reviewed and verified.  Menstrual History: OB History    Gravida Para Term Preterm AB Living   1             SAB TAB Ectopic Multiple Live Births                   Patient's last menstrual period was 09/06/2016.    Past Medical History:  Diagnosis Date  . Anxiety   . Headache    migraines  . Hx of chlamydia infection 10/17/2016    Past Surgical History:  Procedure Laterality Date  . NO PAST SURGERIES       (Not in a hospital admission) No Known Allergies  Social History  Substance Use Topics  . Smoking status: Former Games developermoker  . Smokeless tobacco: Never Used  . Alcohol use No    Family History  Problem Relation Age of Onset  . COPD Mother   . Diabetes Mother   . Mental illness Mother   . Alcohol abuse Mother   . Drug abuse Mother   . Diabetes Father   . Hypertension Father   . Alcohol abuse Father   . Drug abuse Father      Review of Systems Constitutional: negative for weight loss Gastrointestinal: negative for vomiting, + nausea Genitourinary:negative for genital lesions and vaginal discharge and dysuria Musculoskeletal:negative for back pain Behavioral/Psych: negative for abusive relationship, depression, illegal drug usage and tobacco use    Objective:    BP 130/70   Pulse 99   Temp (!) 97.1 F (36.2 C)   Wt 219 lb (99.3 kg)   LMP 09/06/2016   BMI 40.06 kg/m  General Appearance:    Alert, cooperative, no distress, appears stated age  Head:    Normocephalic, without obvious abnormality, atraumatic  Eyes:     PERRL, conjunctiva/corneas clear, EOM's intact, fundi    benign, both eyes  Ears:    Normal TM's and external ear canals, both ears  Nose:   Nares normal, septum midline, mucosa normal, no drainage    or sinus tenderness  Throat:   Lips, mucosa, and tongue normal; teeth and gums normal  Neck:   Supple, symmetrical, trachea midline, no adenopathy;    thyroid:  no enlargement/tenderness/nodules; no carotid   bruit or JVD  Back:     Symmetric, no curvature, ROM normal, no CVA tenderness  Lungs:     Clear to auscultation bilaterally, respirations unlabored  Chest Wall:    No tenderness or deformity   Heart:    Regular rate and rhythm, S1 and S2 normal, no murmur, rub   or gallop  Breast Exam:    No tenderness, masses, or nipple abnormality  Abdomen:     Soft, non-tender, bowel sounds active all four quadrants,    no masses, no organomegaly  Genitalia:    Normal female without lesion, discharge or tenderness  Extremities:   Extremities normal, atraumatic, no cyanosis or edema  Pulses:   2+ and symmetric all extremities  Skin:   Skin color, texture, turgor normal, no rashes  or lesions  Lymph nodes:   Cervical, supraclavicular, and axillary nodes normal  Neurologic:   CNII-XII intact, normal strength, sensation and reflexes    throughout           Cervix; long, thick, closed and posterior.  FHR: by doppler A&B.  Size c/w early us    and twins.     Lab Review Urine pregnancy test Labs reviewed yes Radiologic studies reviewed yes  Assessment & Plan    Pregnancy at 4161w6d weeks    1. Supervision of high risk pregnancy, antepartum     Twin pregnancy.   - Obstetric Panel, Including HIV - Hemoglobinopathy evaluation - Culture, OB Urine - Varicella zoster antibody, IgG - Cervicovaginal ancillary only - Hemoglobin A1c - US MFM OB DETAIL +14 WK; Future - US MFM OB DETAIL ADDL GEST +14 WK; Future - Inheritest Society Guided     Prenatal vitamins.  Counseling provided regarding  continued use of seat belts, cessation of alcohol consumption, smoking or use of illicit drugs; infection precautions i.e., influenza/TDAP immunizations, toxoplasmosis,CMV, parvovirus, listeria and varicella; workplace safety, exercise during pregnancy; routine dental care, safe medications, sexual activity, hot tubs, saunas, pools, travel, caffeine use, fish and methlymercury, potential toxins, hair treatments, varicose veins Weight gain recommendations per IOM guidelines reviewed: underweight/BMI< 18.5--> gain 28 - 40 lbs; normal weight/BMI 18.5 - 24.9--> gain 25 - 35 lbs; overweight/BMI 25 - 29.9--> gain 15 - 25 lbs; obese/BMI >30->gain  11 - 20 lbs Problem list reviewed and updated. FIRST/CF mutation testing/NIPT/QUAD SCREEN/fragile X/Ashkenazi Jewish population testing/Spinal muscular atrophy discussed: ordered. Role of ultrasound in pregnancy discussed; fetal survey: ordered. Amniocentesis discussed: not indicated. VBAC calculator score: VBAC consent form provided No orders of the defined types were placed in this encounter.  Orders Placed This Encounter  Procedures  . Culture, OB Urine  . US MFM OB DETAIL +14 WK    Standing Status:   Future    Standing Expiration Date:   02/17/2018    Order Specific Question:   Reason for Exam (SYMPTOM  OR DIAGNOSIS REQUIRED)    Answer:   twin pregnancy    Order Specific Question:   Preferred imaging location?    Answer:   [facility]  . US MFM OB DETAIL ADDL GEST +14 WK    Standing Status:   Future    Standing Expiration Date:   02/17/2018    Order Specific Question:   Reason for Exam (SYMPTOM  OR DIAGNOSIS REQUIRED)    Answer:   fetal anatomy scans, twin gestation    Order Specific Question:   Preferred imaging location?    Answer:   [facility]  . Obstetric Panel, Including HIV  . Hemoglobinopathy evaluation  . Varicella zoster antibody, IgG  . Hemoglobin A1c  . Inheritest Society Guided    Follow up in 4 weeks. 50% of 30 min visit  spent on counseling and coordination of care.

## 2016-12-20 LAB — CULTURE, OB URINE

## 2016-12-20 LAB — URINE CULTURE, OB REFLEX

## 2016-12-31 ENCOUNTER — Encounter: Payer: Self-pay | Admitting: *Deleted

## 2017-01-07 ENCOUNTER — Other Ambulatory Visit: Payer: Self-pay | Admitting: Certified Nurse Midwife

## 2017-01-07 DIAGNOSIS — O0990 Supervision of high risk pregnancy, unspecified, unspecified trimester: Secondary | ICD-10-CM

## 2017-01-07 DIAGNOSIS — O9989 Other specified diseases and conditions complicating pregnancy, childbirth and the puerperium: Secondary | ICD-10-CM

## 2017-01-07 DIAGNOSIS — O09899 Supervision of other high risk pregnancies, unspecified trimester: Secondary | ICD-10-CM

## 2017-01-07 DIAGNOSIS — Z283 Underimmunization status: Secondary | ICD-10-CM

## 2017-01-07 DIAGNOSIS — Z2839 Other underimmunization status: Secondary | ICD-10-CM | POA: Insufficient documentation

## 2017-01-07 LAB — INHERITEST SOCIETY GUIDED

## 2017-01-07 LAB — HEMOGLOBINOPATHY EVALUATION
HEMOGLOBIN A2 QUANTITATION: 2.7 % (ref 1.8–3.2)
HGB A: 97.3 % (ref 96.4–98.8)
HGB C: 0 %
HGB S: 0 %
HGB VARIANT: 0 %
Hemoglobin F Quantitation: 0 % (ref 0.0–2.0)

## 2017-01-07 LAB — OBSTETRIC PANEL, INCLUDING HIV
Antibody Screen: NEGATIVE
BASOS ABS: 0 10*3/uL (ref 0.0–0.2)
Basos: 0 %
EOS (ABSOLUTE): 0.1 10*3/uL (ref 0.0–0.4)
Eos: 1 %
HIV Screen 4th Generation wRfx: NONREACTIVE
Hematocrit: 36.4 % (ref 34.0–46.6)
Hemoglobin: 12.1 g/dL (ref 11.1–15.9)
Hepatitis B Surface Ag: NEGATIVE
Immature Grans (Abs): 0 10*3/uL (ref 0.0–0.1)
Immature Granulocytes: 0 %
LYMPHS: 17 %
Lymphocytes Absolute: 1.3 10*3/uL (ref 0.7–3.1)
MCH: 31.2 pg (ref 26.6–33.0)
MCHC: 33.2 g/dL (ref 31.5–35.7)
MCV: 94 fL (ref 79–97)
MONOCYTES: 7 %
MONOS ABS: 0.6 10*3/uL (ref 0.1–0.9)
NEUTROS PCT: 75 %
Neutrophils Absolute: 5.7 10*3/uL (ref 1.4–7.0)
PLATELETS: 182 10*3/uL (ref 150–379)
RBC: 3.88 x10E6/uL (ref 3.77–5.28)
RDW: 13.6 % (ref 12.3–15.4)
RPR Ser Ql: NONREACTIVE
Rh Factor: POSITIVE
WBC: 7.7 10*3/uL (ref 3.4–10.8)

## 2017-01-07 LAB — HEMOGLOBIN A1C
Est. average glucose Bld gHb Est-mCnc: 97 mg/dL
HEMOGLOBIN A1C: 5 % (ref 4.8–5.6)

## 2017-01-07 LAB — VARICELLA ZOSTER ANTIBODY, IGG: VARICELLA: 2559 {index} (ref >165)

## 2017-01-08 NOTE — Progress Notes (Signed)
Left message on VM to call office.

## 2017-01-09 NOTE — Progress Notes (Signed)
Patient notified

## 2017-01-15 ENCOUNTER — Ambulatory Visit (INDEPENDENT_AMBULATORY_CARE_PROVIDER_SITE_OTHER): Payer: Medicaid Other | Admitting: Obstetrics and Gynecology

## 2017-01-15 VITALS — BP 137/82 | HR 97 | Wt 218.0 lb

## 2017-01-15 DIAGNOSIS — Z283 Underimmunization status: Secondary | ICD-10-CM

## 2017-01-15 DIAGNOSIS — O9989 Other specified diseases and conditions complicating pregnancy, childbirth and the puerperium: Secondary | ICD-10-CM

## 2017-01-15 DIAGNOSIS — O0990 Supervision of high risk pregnancy, unspecified, unspecified trimester: Secondary | ICD-10-CM

## 2017-01-15 DIAGNOSIS — O09899 Supervision of other high risk pregnancies, unspecified trimester: Secondary | ICD-10-CM

## 2017-01-15 DIAGNOSIS — O30002 Twin pregnancy, unspecified number of placenta and unspecified number of amniotic sacs, second trimester: Secondary | ICD-10-CM

## 2017-01-15 MED ORDER — ONDANSETRON 4 MG PO TBDP
4.0000 mg | ORAL_TABLET | Freq: Four times a day (QID) | ORAL | 1 refills | Status: DC | PRN
Start: 2017-01-15 — End: 2017-04-18

## 2017-01-15 NOTE — Progress Notes (Signed)
Subjective:  Jackie Chavez is a 21 y.o. G1P0 at 34w5dbeing seen today for ongoing prenatal care.  She is currently monitored for the following issues for this high-risk pregnancy and has Twin gestation in second trimester; Supervision of high risk pregnancy, antepartum; and Rubella non-immune status, antepartum on her problem list.  Patient reports nausea.  Contractions: Irritability. Vag. Bleeding: None.  Movement: Present. Denies leaking of fluid.   The following portions of the patient's history were reviewed and updated as appropriate: allergies, current medications, past family history, past medical history, past social history, past surgical history and problem list. Problem list updated.  Objective:   Vitals:   01/15/17 0955  BP: 137/82  Pulse: 97  Weight: 218 lb (98.9 kg)    Fetal Status: Fetal Heart Rate (bpm): A:  138; B:  152   Movement: Present     General:  Alert, oriented and cooperative. Patient is in no acute distress.  Skin: Skin is warm and dry. No rash noted.   Cardiovascular: Normal heart rate noted  Respiratory: Normal respiratory effort, no problems with respiration noted  Abdomen: Soft, gravid, appropriate for gestational age. Pain/Pressure: Present     Pelvic:  Cervical exam deferred        Extremities: Normal range of motion.  Edema: Trace  Mental Status: Normal mood and affect. Normal behavior. Normal judgment and thought content.   Urinalysis:      Assessment and Plan:  Pregnancy: G1P0 at 148w5d1. Twin gestation in second trimester, unspecified multiple gestation type Stable Anatomy scan next of the week  2. Supervision of high risk pregnancy, antepartum Stable  3. Rubella non-immune status, antepartum MMR postpartum  Preterm labor symptoms and general obstetric precautions including but not limited to vaginal bleeding, contractions, leaking of fluid and fetal movement were reviewed in detail with the patient. Please refer to After Visit  Summary for other counseling recommendations.  Return in about 4 weeks (around 02/12/2017) for OB visit.   ErChancy MilroyMD

## 2017-01-18 ENCOUNTER — Ambulatory Visit (HOSPITAL_COMMUNITY)
Admission: RE | Admit: 2017-01-18 | Discharge: 2017-01-18 | Disposition: A | Discharge status: Home / Self Care | Payer: Medicaid Other | Source: Ambulatory Visit | Attending: Certified Nurse Midwife | Admitting: Certified Nurse Midwife

## 2017-01-18 DIAGNOSIS — O30042 Twin pregnancy, dichorionic/diamniotic, second trimester: Secondary | ICD-10-CM | POA: Diagnosis not present

## 2017-01-18 DIAGNOSIS — Z3A19 19 weeks gestation of pregnancy: Secondary | ICD-10-CM | POA: Insufficient documentation

## 2017-01-18 DIAGNOSIS — O0992 Supervision of high risk pregnancy, unspecified, second trimester: Secondary | ICD-10-CM | POA: Diagnosis present

## 2017-01-18 DIAGNOSIS — O0990 Supervision of high risk pregnancy, unspecified, unspecified trimester: Secondary | ICD-10-CM

## 2017-01-22 ENCOUNTER — Other Ambulatory Visit: Payer: Self-pay | Admitting: Certified Nurse Midwife

## 2017-01-22 DIAGNOSIS — O30002 Twin pregnancy, unspecified number of placenta and unspecified number of amniotic sacs, second trimester: Secondary | ICD-10-CM

## 2017-01-27 ENCOUNTER — Inpatient Hospital Stay (HOSPITAL_COMMUNITY)
Admission: AD | Admit: 2017-01-27 | Discharge: 2017-01-27 | Disposition: A | Discharge status: Home / Self Care | Payer: Medicaid Other | Source: Ambulatory Visit | Attending: Obstetrics and Gynecology | Admitting: Obstetrics and Gynecology

## 2017-01-27 ENCOUNTER — Encounter (HOSPITAL_COMMUNITY): Payer: Self-pay

## 2017-01-27 DIAGNOSIS — Z87891 Personal history of nicotine dependence: Secondary | ICD-10-CM | POA: Diagnosis not present

## 2017-01-27 DIAGNOSIS — O26899 Other specified pregnancy related conditions, unspecified trimester: Secondary | ICD-10-CM

## 2017-01-27 DIAGNOSIS — O139 Gestational [pregnancy-induced] hypertension without significant proteinuria, unspecified trimester: Secondary | ICD-10-CM

## 2017-01-27 DIAGNOSIS — Z3A20 20 weeks gestation of pregnancy: Secondary | ICD-10-CM | POA: Diagnosis not present

## 2017-01-27 DIAGNOSIS — O30002 Twin pregnancy, unspecified number of placenta and unspecified number of amniotic sacs, second trimester: Secondary | ICD-10-CM | POA: Diagnosis not present

## 2017-01-27 DIAGNOSIS — R109 Unspecified abdominal pain: Secondary | ICD-10-CM | POA: Diagnosis not present

## 2017-01-27 DIAGNOSIS — O26892 Other specified pregnancy related conditions, second trimester: Secondary | ICD-10-CM | POA: Diagnosis not present

## 2017-01-27 DIAGNOSIS — R1030 Lower abdominal pain, unspecified: Secondary | ICD-10-CM | POA: Insufficient documentation

## 2017-01-27 LAB — URINALYSIS, ROUTINE W REFLEX MICROSCOPIC
BACTERIA UA: NONE SEEN
Bilirubin Urine: NEGATIVE
Glucose, UA: NEGATIVE mg/dL
Hgb urine dipstick: NEGATIVE
Ketones, ur: NEGATIVE mg/dL
Leukocytes, UA: NEGATIVE
Nitrite: NEGATIVE
PH: 6 (ref 5.0–8.0)
Protein, ur: NEGATIVE mg/dL
SPECIFIC GRAVITY, URINE: 1.014 (ref 1.005–1.030)

## 2017-01-27 MED ORDER — ACETAMINOPHEN 325 MG PO TABS
650.0000 mg | ORAL_TABLET | Freq: Once | ORAL | Status: AC
  Administered 2017-01-27: 650 mg via ORAL
  Filled 2017-01-27: qty 2

## 2017-01-27 NOTE — Discharge Instructions (Signed)

## 2017-01-27 NOTE — MAU Note (Signed)
Pt states she had cramps yesterday, was lifting up a box & it fell on her abdomen @ 0230 in the morning, continues to have mid & lower abd pain.  Denies bleeding or LOF.

## 2017-01-27 NOTE — MAU Provider Note (Signed)
[facility] Provider Note   CSN: [id number] Arrival date & time: 01/27/17  1823     History   Chief Complaint Chief Complaint  Patient presents with  . Abdominal Pain    HPI Jackie Chavez is a 21 y.o. G1P0  gestation with twins who presents to the MAU with lower abdominal cramping that radiates to her back. Patient reports that this morning at work a body full of grape jellies, about 5 pounds, fell on her abdomen. The area to the mid abdomen is painful near the umbilicus. Patient was having cramping yesterday but today after the accident the cramping got worse. Patient denies bleeding or leaking of fluid.   Abdominal Pain  This is a new problem. The current episode started today. The problem occurs constantly. The problem has been waxing and waning. The pain is located in the periumbilical region. The pain is at a severity of 8/10. The quality of the pain is cramping. The abdominal pain radiates to the back. Pertinent negatives include no dysuria, fever, headaches, nausea or vomiting. Nothing aggravates the pain. The pain is relieved by nothing. She has tried nothing for the symptoms.    Past Medical History:  Diagnosis Date  . Anxiety   . Headache    migraines  . Hx of chlamydia infection 10/17/2016    Patient Active Problem List   Diagnosis Date Noted  . Rubella non-immune status, antepartum 01/07/2017  . Supervision of high risk pregnancy, antepartum 12/18/2016  . Twin gestation in second trimester 10/21/2016    Past Surgical History:  Procedure Laterality Date  . NO PAST SURGERIES      OB History    Gravida Para Term Preterm AB Living   1             SAB TAB Ectopic Multiple Live Births                   Home Medications    Prior to Admission medications   Medication Sig Start Date End Date Taking? Authorizing Provider  ondansetron (ZOFRAN ODT) 4 MG disintegrating tablet Take 1 tablet (4 mg total) by mouth every 6 (six) hours as needed for  nausea. 01/15/17   [name], MD  Prenat-FeAsp-Meth-FA-DHA w/o A (PRENATE PIXIE) 10-0.6-0.4-200 MG CAPS Take 1 tablet by mouth daily. 12/19/16   Roe Coombs, CNM  Prenatal Vit-Fe Fumarate-FA (PRENATAL MULTIVITAMIN) TABS tablet Take 1 tablet by mouth daily.    [provider]    Family History Family History  Problem Relation Age of Onset  . COPD Mother   . Diabetes Mother   . Mental illness Mother   . Alcohol abuse Mother   . Drug abuse Mother   . Diabetes Father   . Hypertension Father   . Alcohol abuse Father   . Drug abuse Father     Social History Social History  Substance Use Topics  . Smoking status: Former Games developer  . Smokeless tobacco: Never Used  . Alcohol use No     Allergies   Patient has no known allergies.   Review of Systems Review of Systems  Constitutional: Negative for chills and fever.  HENT: Negative.   Eyes: Negative for visual disturbance.  Respiratory: Negative for chest tightness and shortness of breath.   Cardiovascular: Negative for chest pain.  Gastrointestinal: Positive for abdominal pain. Negative for nausea and vomiting.  Genitourinary: Negative for dysuria, vaginal bleeding and vaginal discharge.  Musculoskeletal: Positive for back pain. Negative  for neck pain.  Skin: Negative for rash and wound.  Neurological: Negative for speech difficulty and headaches.  Psychiatric/Behavioral: Negative for confusion. The patient is not nervous/anxious.      Physical Exam Updated Vital Signs BP 140/81   Pulse 94   Temp 97.6 F (36.4 C) (Oral)   Resp 18   LMP 09/06/2016   Physical Exam  Constitutional: She is oriented to person, place, and time. She appears well-developed and well-nourished. No distress.  HENT:  Head: Normocephalic.  Eyes: EOM are normal.  Neck: Neck supple.  Cardiovascular: Normal rate and regular rhythm.   Pulmonary/Chest: Effort normal and breath sounds normal.  Abdominal: Soft. Bowel sounds are  normal. There is tenderness in the periumbilical area and left lower quadrant. There is no rigidity, no rebound, no guarding and no CVA tenderness.  Fundus at unbilicus  Musculoskeletal: Normal range of motion.  Neurological: She is alert and oriented to person, place, and time. No cranial nerve deficit.  Skin: Skin is warm and dry.  Psychiatric: She has a normal mood and affect. Her behavior is normal.  Nursing note and vitals reviewed.    ED Treatments / Results  Labs (all labs ordered are listed, but only abnormal results are displayed) Labs Reviewed  URINALYSIS, ROUTINE W REFLEX MICROSCOPIC - Abnormal; Notable for the following:       Result Value   Squamous Epithelial / LPF 0-5 (*)    All other components within normal limits    Procedures Procedures (including critical care time)  Medications Ordered in ED Medications  acetaminophen (TYLENOL) tablet 650 mg (650 mg Oral Given 01/27/17 1930)     Initial Impression / Assessment and Plan / ED Course  I have reviewed the triage vital signs and the nursing notes.  Care turned over to Wynelle BourgeoisMarie Williams, CNM @ 8:00 pm Care turned [facility] to [facility] Plaza[facility]ie Mutasim Tuckey  FHT 130, 150 Bedside U/S done and reassuring.  Cervix closed.  Final Clinical Impressions(s) / ED Diagnoses   Final diagnoses:  Abdominal pain affecting pregnancy   --Discussed return precautions at length --D/c home in stable condition.  Raynelle FanningJulie P. Tylerjames Hoglund, MD OB Fellow

## 2017-01-28 DIAGNOSIS — O139 Gestational [pregnancy-induced] hypertension without significant proteinuria, unspecified trimester: Secondary | ICD-10-CM

## 2017-02-08 ENCOUNTER — Inpatient Hospital Stay (HOSPITAL_COMMUNITY): Payer: Medicaid Other

## 2017-02-08 ENCOUNTER — Inpatient Hospital Stay (HOSPITAL_COMMUNITY)
Admission: AD | Admit: 2017-02-08 | Discharge: 2017-02-08 | Disposition: A | Discharge status: Home / Self Care | Payer: Medicaid Other | Source: Ambulatory Visit | Attending: Obstetrics and Gynecology | Admitting: Obstetrics and Gynecology

## 2017-02-08 ENCOUNTER — Encounter (HOSPITAL_COMMUNITY): Payer: Self-pay

## 2017-02-08 DIAGNOSIS — O30042 Twin pregnancy, dichorionic/diamniotic, second trimester: Secondary | ICD-10-CM | POA: Diagnosis not present

## 2017-02-08 DIAGNOSIS — Z3A22 22 weeks gestation of pregnancy: Secondary | ICD-10-CM | POA: Diagnosis not present

## 2017-02-08 DIAGNOSIS — O4692 Antepartum hemorrhage, unspecified, second trimester: Secondary | ICD-10-CM | POA: Diagnosis present

## 2017-02-08 DIAGNOSIS — O98812 Other maternal infectious and parasitic diseases complicating pregnancy, second trimester: Secondary | ICD-10-CM | POA: Insufficient documentation

## 2017-02-08 DIAGNOSIS — O9989 Other specified diseases and conditions complicating pregnancy, childbirth and the puerperium: Secondary | ICD-10-CM | POA: Diagnosis not present

## 2017-02-08 DIAGNOSIS — B3731 Acute candidiasis of vulva and vagina: Secondary | ICD-10-CM

## 2017-02-08 DIAGNOSIS — B373 Candidiasis of vulva and vagina: Secondary | ICD-10-CM | POA: Insufficient documentation

## 2017-02-08 DIAGNOSIS — Z87891 Personal history of nicotine dependence: Secondary | ICD-10-CM | POA: Insufficient documentation

## 2017-02-08 LAB — WET PREP, GENITAL
CLUE CELLS WET PREP: NONE SEEN
Sperm: NONE SEEN
Trich, Wet Prep: NONE SEEN
Yeast Wet Prep HPF POC: NONE SEEN

## 2017-02-08 LAB — URINALYSIS, ROUTINE W REFLEX MICROSCOPIC
BILIRUBIN URINE: NEGATIVE
GLUCOSE, UA: NEGATIVE mg/dL
HGB URINE DIPSTICK: NEGATIVE
KETONES UR: NEGATIVE mg/dL
NITRITE: NEGATIVE
PH: 6 (ref 5.0–8.0)
PROTEIN: 30 mg/dL — AB
Specific Gravity, Urine: 1.031 — ABNORMAL HIGH (ref 1.005–1.030)

## 2017-02-08 MED ORDER — TERCONAZOLE 0.8 % VA CREA
1.0000 | TOPICAL_CREAM | Freq: Every day | VAGINAL | 0 refills | Status: DC
Start: 2017-02-08 — End: 2017-02-12
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
## 2017-02-08 NOTE — Discharge Instructions (Signed)
Vaginal Yeast infection, Adult Vaginal yeast infection is a condition that causes soreness, swelling, and redness (inflammation) of the vagina. It also causes vaginal discharge. This is a common condition. Some women get this infection frequently. What are the causes? This condition is caused by a change in the normal balance of the yeast (candida) and bacteria that live in the vagina. This change causes an overgrowth of yeast, which causes the inflammation. What increases the risk? This condition is more likely to develop in:  Women who take antibiotic medicines.  Women who have diabetes.  Women who take birth control pills.  Women who are pregnant.  Women who douche often.  Women who have a weak defense (immune) system.  Women who have been taking steroid medicines for a long time.  Women who frequently wear tight clothing.  What are the signs or symptoms? Symptoms of this condition include:  White, thick vaginal discharge.  Swelling, itching, redness, and irritation of the vagina. The lips of the vagina (vulva) may be affected as well.  Pain or a burning feeling while urinating.  Pain during sex.  How is this diagnosed? This condition is diagnosed with a medical history and physical exam. This will include a pelvic exam. Your health care provider will examine a sample of your vaginal discharge under a microscope. Your health care provider may send this sample for testing to confirm the diagnosis. How is this treated? This condition is treated with medicine. Medicines may be over-the-counter or prescription. You may be told to use one or more of the following:  Medicine that is taken orally.  Medicine that is applied as a cream.  Medicine that is inserted directly into the vagina (suppository).  Follow these instructions at home:  Take or apply over-the-counter and prescription medicines only as told by your health care provider.  Do not have sex until your health  care provider has approved. Tell your sex partner that you have a yeast infection. That person should go to his or her health care provider if he or she develops symptoms.  Do not wear tight clothes, such as pantyhose or tight pants.  Avoid using tampons until your health care provider approves.  Eat more yogurt. This may help to keep your yeast infection from returning.  Try taking a sitz bath to help with discomfort. This is a warm water bath that is taken while you are sitting down. The water should only come up to your hips and should cover your buttocks. Do this 3-4 times per day or as told by your health care provider.  Do not douche.  Wear breathable, cotton underwear.  If you have diabetes, keep your blood sugar levels under control. Contact a health care provider if:  You have a fever.  Your symptoms go away and then return.  Your symptoms do not get better with treatment.  Your symptoms get worse.  You have new symptoms.  You develop blisters in or around your vagina.  You have blood coming from your vagina and it is not your menstrual period.  You develop pain in your abdomen. This information is not intended to replace advice given to you by your health care provider. Make sure you discuss any questions you have with your health care provider. Document Released: 02/14/2005 Document Revised: 10/19/2015 Document Reviewed: 11/08/2014 Elsevier Interactive Patient Education  2018 [employer].    Vaginal Bleeding During Pregnancy, Second Trimester A small amount of bleeding (spotting) from the vagina is relatively common  in pregnancy. It usually stops on its own. Various things can cause bleeding or spotting in pregnancy. Some bleeding may be related to the pregnancy, and some may not. Sometimes the bleeding is normal and is not a problem. However, bleeding can also be a sign of something serious. Be sure to tell your health care provider about any vaginal bleeding  right away. Some possible causes of vaginal bleeding during the second trimester include:  Infection, inflammation, or growths on the cervix.  The placenta may be partially or completely covering the opening of the cervix inside the uterus (placenta previa).  The placenta may have separated from the uterus (abruption of the placenta).  You may be having early (preterm) labor.  The cervix may not be strong enough to keep a baby inside the uterus (cervical insufficiency).  Tiny cysts may have developed in the uterus instead of pregnancy tissue (molar pregnancy).  Follow these instructions at home: Watch your condition for any changes. The following actions may help to lessen any discomfort you are feeling:  Follow your health care provider's instructions for limiting your activity. If your health care provider orders bed rest, you may need to stay in bed and only get up to use the bathroom. However, your health care provider may allow you to continue light activity.  If needed, make plans for someone to help with your regular activities and responsibilities while you are on bed rest.  Keep track of the number of pads you use each day, how often you change pads, and how soaked (saturated) they are. Write this down.  Do not use tampons. Do not douche.  Do not have sexual intercourse or orgasms until approved by your health care provider.  If you pass any tissue from your vagina, save the tissue so you can show it to your health care provider.  Only take over-the-counter or prescription medicines as directed by your health care provider.  Do not take aspirin because it can make you bleed.  Do not exercise or perform any strenuous activities or heavy lifting without your health care provider's permission.  Keep all follow-up appointments as directed by your health care provider.  Contact a health care provider if:  You have any vaginal bleeding during any part of your  pregnancy.  You have cramps or labor pains.  You have a fever, not controlled by medicine. Get help right away if:  You have severe cramps in your back or belly (abdomen).  You have contractions.  You have chills.  You pass large clots or tissue from your vagina.  Your bleeding increases.  You feel light-headed or weak, or you have fainting episodes.  You are leaking fluid or have a gush of fluid from your vagina. This information is not intended to replace advice given to you by your health care provider. Make sure you discuss any questions you have with your health care provider. Document Released: 02/14/2005 Document Revised: 10/13/2015 Document Reviewed: 01/12/2013 Elsevier Interactive Patient Education  [employer].

## 2017-02-11 LAB — GC/CHLAMYDIA PROBE AMP (~~LOC~~) NOT AT ARMC
Chlamydia: NEGATIVE
Neisseria Gonorrhea: NEGATIVE

## 2017-02-12 ENCOUNTER — Ambulatory Visit (INDEPENDENT_AMBULATORY_CARE_PROVIDER_SITE_OTHER): Payer: Medicaid Other | Admitting: Obstetrics and Gynecology

## 2017-02-12 VITALS — BP 134/86 | HR 109 | Wt 222.0 lb

## 2017-02-12 DIAGNOSIS — O30042 Twin pregnancy, dichorionic/diamniotic, second trimester: Secondary | ICD-10-CM

## 2017-02-12 DIAGNOSIS — Z283 Underimmunization status: Secondary | ICD-10-CM

## 2017-02-12 DIAGNOSIS — O0990 Supervision of high risk pregnancy, unspecified, unspecified trimester: Secondary | ICD-10-CM

## 2017-02-12 DIAGNOSIS — O09899 Supervision of other high risk pregnancies, unspecified trimester: Secondary | ICD-10-CM

## 2017-02-12 DIAGNOSIS — O132 Gestational [pregnancy-induced] hypertension without significant proteinuria, second trimester: Secondary | ICD-10-CM

## 2017-02-12 DIAGNOSIS — O9989 Other specified diseases and conditions complicating pregnancy, childbirth and the puerperium: Secondary | ICD-10-CM

## 2017-02-12 MED ORDER — VITAFOL GUMMIES 3.33-0.333-34.8 MG PO CHEW
1.0000 | CHEWABLE_TABLET | Freq: Every day | ORAL | 3 refills | Status: DC
Start: 2017-02-12 — End: 2017-05-05

## 2017-02-12 NOTE — Progress Notes (Signed)
Subjective:  Jackie Chavez is a 21 y.o. G1P0 at 29w5dbeing seen today for ongoing prenatal care.  She is currently monitored for the following issues for this high-risk pregnancy and has Twin gestation in second trimester; Supervision of high risk pregnancy, antepartum; Rubella non-immune status, antepartum; and Transient hypertension of pregnancy on her problem list.  Patient reports 2 MAU visits since last appt. Issues have resolved.  Contractions: Not present. Vag. Bleeding: None.  Movement: Present. Denies leaking of fluid.   The following portions of the patient's history were reviewed and updated as appropriate: allergies, current medications, past family history, past medical history, past social history, past surgical history and problem list. Problem list updated.  Objective:   Vitals:   02/12/17 1302  BP: 134/86  Pulse: (!) 109  Weight: 222 lb (100.7 kg)    Fetal Status: Fetal Heart Rate (bpm): A 138; B 155   Movement: Present     General:  Alert, oriented and cooperative. Patient is in no acute distress.  Skin: Skin is warm and dry. No rash noted.   Cardiovascular: Normal heart rate noted  Respiratory: Normal respiratory effort, no problems with respiration noted  Abdomen: Soft, gravid, appropriate for gestational age. Pain/Pressure: Present     Pelvic:  Cervical exam deferred        Extremities: Normal range of motion.  Edema: Trace  Mental Status: Normal mood and affect. Normal behavior. Normal judgment and thought content.   Urinalysis: Urine Protein: Negative Urine Glucose: Negative  Assessment and Plan:  Pregnancy: G1P0 at 264w5d1. Supervision of high risk pregnancy, antepartum Stable  2. Rubella non-immune status, antepartum MMR PP  3. Dichorionic diamniotic twin pregnancy in second trimester Stable Growth scan next week  4. Transient hypertension of pregnancy in second trimester  - Protein / creatinine ratio, urine - Comp Met (CMET) -  CBC  Preterm labor symptoms and general obstetric precautions including but not limited to vaginal bleeding, contractions, leaking of fluid and fetal movement were reviewed in detail with the patient. Please refer to After Visit Summary for other counseling recommendations.  No Follow-up on file.   ErChancy MilroyMD

## 2017-02-12 NOTE — Progress Notes (Signed)
Pt c/o stretch marks and request a prescription.

## 2017-02-13 LAB — COMPREHENSIVE METABOLIC PANEL
A/G RATIO: 1.5 (ref 1.2–2.2)
ALT: 22 IU/L (ref 0–32)
AST: 16 IU/L (ref 0–40)
Albumin: 3.5 g/dL (ref 3.5–5.5)
Alkaline Phosphatase: 73 IU/L (ref 39–117)
BUN/Creatinine Ratio: 16 (ref 9–23)
BUN: 9 mg/dL (ref 6–20)
Bilirubin Total: 0.2 mg/dL (ref 0.0–1.2)
CO2: 19 mmol/L — ABNORMAL LOW (ref 20–29)
Calcium: 8.7 mg/dL (ref 8.7–10.2)
Chloride: 103 mmol/L (ref 96–106)
Creatinine, Ser: 0.57 mg/dL (ref 0.57–1.00)
GFR, EST AFRICAN AMERICAN: 154 mL/min/{1.73_m2} (ref >59)
GFR, EST NON AFRICAN AMERICAN: 134 mL/min/{1.73_m2} (ref >59)
GLUCOSE: 84 mg/dL (ref 65–99)
Globulin, Total: 2.3 g/dL (ref 1.5–4.5)
POTASSIUM: 4.1 mmol/L (ref 3.5–5.2)
Sodium: 139 mmol/L (ref 134–144)
Total Protein: 5.8 g/dL — ABNORMAL LOW (ref 6.0–8.5)

## 2017-02-13 LAB — CBC
HEMATOCRIT: 33.8 % — AB (ref 34.0–46.6)
HEMOGLOBIN: 11.2 g/dL (ref 11.1–15.9)
MCH: 31.2 pg (ref 26.6–33.0)
MCHC: 33.1 g/dL (ref 31.5–35.7)
MCV: 94 fL (ref 79–97)
Platelets: 211 10*3/uL (ref 150–379)
RBC: 3.59 x10E6/uL — ABNORMAL LOW (ref 3.77–5.28)
RDW: 13.3 % (ref 12.3–15.4)
WBC: 10.5 10*3/uL (ref 3.4–10.8)

## 2017-02-13 LAB — PROTEIN / CREATININE RATIO, URINE
CREATININE, UR: 210.4 mg/dL
PROTEIN/CREAT RATIO: 116 mg/g{creat} (ref 0–200)
Protein, Ur: 24.4 mg/dL

## 2017-02-21 ENCOUNTER — Other Ambulatory Visit: Payer: Self-pay | Admitting: Certified Nurse Midwife

## 2017-02-21 ENCOUNTER — Ambulatory Visit (HOSPITAL_COMMUNITY)
Admission: RE | Admit: 2017-02-21 | Discharge: 2017-02-21 | Disposition: A | Discharge status: Home / Self Care | Payer: Medicaid Other | Source: Ambulatory Visit | Attending: Certified Nurse Midwife | Admitting: Certified Nurse Midwife

## 2017-02-21 DIAGNOSIS — Z0489 Encounter for examination and observation for other specified reasons: Secondary | ICD-10-CM

## 2017-02-21 DIAGNOSIS — O30042 Twin pregnancy, dichorionic/diamniotic, second trimester: Secondary | ICD-10-CM | POA: Diagnosis not present

## 2017-02-21 DIAGNOSIS — Z3A24 24 weeks gestation of pregnancy: Secondary | ICD-10-CM | POA: Insufficient documentation

## 2017-02-21 DIAGNOSIS — IMO0002 Reserved for concepts with insufficient information to code with codable children: Secondary | ICD-10-CM

## 2017-02-21 DIAGNOSIS — Z362 Encounter for other antenatal screening follow-up: Secondary | ICD-10-CM | POA: Diagnosis not present

## 2017-02-21 DIAGNOSIS — O30002 Twin pregnancy, unspecified number of placenta and unspecified number of amniotic sacs, second trimester: Secondary | ICD-10-CM

## 2017-02-25 ENCOUNTER — Other Ambulatory Visit: Payer: Self-pay | Admitting: Certified Nurse Midwife

## 2017-02-28 ENCOUNTER — Encounter (HOSPITAL_COMMUNITY): Payer: Self-pay | Admitting: *Deleted

## 2017-02-28 ENCOUNTER — Inpatient Hospital Stay (HOSPITAL_COMMUNITY)
Admission: AD | Admit: 2017-02-28 | Discharge: 2017-02-28 | Disposition: A | Discharge status: Home / Self Care | Payer: Medicaid Other | Source: Ambulatory Visit | Attending: Family Medicine | Admitting: Family Medicine

## 2017-02-28 DIAGNOSIS — Z79899 Other long term (current) drug therapy: Secondary | ICD-10-CM | POA: Diagnosis not present

## 2017-02-28 DIAGNOSIS — F419 Anxiety disorder, unspecified: Secondary | ICD-10-CM | POA: Diagnosis not present

## 2017-02-28 DIAGNOSIS — Z87891 Personal history of nicotine dependence: Secondary | ICD-10-CM | POA: Diagnosis not present

## 2017-02-28 DIAGNOSIS — O26892 Other specified pregnancy related conditions, second trimester: Secondary | ICD-10-CM | POA: Diagnosis present

## 2017-02-28 DIAGNOSIS — O99342 Other mental disorders complicating pregnancy, second trimester: Secondary | ICD-10-CM | POA: Diagnosis not present

## 2017-02-28 DIAGNOSIS — O30042 Twin pregnancy, dichorionic/diamniotic, second trimester: Secondary | ICD-10-CM

## 2017-02-28 DIAGNOSIS — R42 Dizziness and giddiness: Secondary | ICD-10-CM | POA: Diagnosis not present

## 2017-02-28 DIAGNOSIS — O0992 Supervision of high risk pregnancy, unspecified, second trimester: Secondary | ICD-10-CM | POA: Diagnosis not present

## 2017-02-28 DIAGNOSIS — R55 Syncope and collapse: Secondary | ICD-10-CM

## 2017-02-28 DIAGNOSIS — Z3A25 25 weeks gestation of pregnancy: Secondary | ICD-10-CM | POA: Insufficient documentation

## 2017-02-28 DIAGNOSIS — O0990 Supervision of high risk pregnancy, unspecified, unspecified trimester: Secondary | ICD-10-CM

## 2017-02-28 DIAGNOSIS — O9989 Other specified diseases and conditions complicating pregnancy, childbirth and the puerperium: Secondary | ICD-10-CM

## 2017-02-28 LAB — URINALYSIS, ROUTINE W REFLEX MICROSCOPIC
Bilirubin Urine: NEGATIVE
Glucose, UA: NEGATIVE mg/dL
Hgb urine dipstick: NEGATIVE
Ketones, ur: NEGATIVE mg/dL
Leukocytes, UA: NEGATIVE
NITRITE: NEGATIVE
PROTEIN: 30 mg/dL — AB
Specific Gravity, Urine: 1.029 (ref 1.005–1.030)
pH: 6 (ref 5.0–8.0)
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
## 2017-02-28 NOTE — Discharge Instructions (Signed)
Hypoglycemia Hypoglycemia is when the sugar (glucose) level in the blood is too low. Symptoms of low blood sugar may include:  Feeling: ? Hungry. ? Worried or nervous (anxious). ? Sweaty and clammy. ? Confused. ? Dizzy. ? Sleepy. ? Sick to your stomach (nauseous).  Having: ? A fast heartbeat. ? A headache. ? A change in your vision. ? Jerky movements that you cannot control (seizure). ? Nightmares. ? Tingling or no feeling (numbness) around the mouth, lips, or tongue.  Having trouble with: ? Talking. ? Paying attention (concentrating). ? Moving (coordination). ? Sleeping.  Shaking.  Passing out (fainting).  Getting upset easily (irritability).  Low blood sugar can happen to people who have diabetes and people who do not have diabetes. Low blood sugar can happen quickly, and it can be an emergency. Treating Low Blood Sugar Low blood sugar is often treated by eating or drinking something sugary right away. If you can think clearly and swallow safely, follow the 15:15 rule:  Take 15 grams of a fast-acting carb (carbohydrate). Some fast-acting carbs are: ? 1 tube of glucose gel. ? 3 sugar tablets (glucose pills). ? 6-8 pieces of hard candy. ? 4 oz (120 mL) of fruit juice. ? 4 oz (120 mL) of regular (not diet) soda.  Check your blood sugar 15 minutes after you take the carb.  If your blood sugar is still at or below 70 mg/dL (3.9 mmol/L), take 15 grams of a carb again.  If your blood sugar does not go above 70 mg/dL (3.9 mmol/L) after 3 tries, get help right away.  After your blood sugar goes back to normal, eat a meal or a snack within 1 hour.  Treating Very Low Blood Sugar If your blood sugar is at or below 54 mg/dL (3 mmol/L), you have very low blood sugar (severe hypoglycemia). This is an emergency. Do not wait to see if the symptoms will go away. Get medical help right away. Call your local emergency services (911 in the [place] Do not drive yourself to the  hospital. If you have very low blood sugar and you cannot eat or drink, you may need a glucagon shot (injection). A family member or friend should learn how to check your blood sugar and how to give you a glucagon shot. Ask your doctor if you need to have a glucagon shot kit at home. Follow these instructions at home: General instructions  Avoid any diets that cause you to not eat enough food. Talk with your doctor before you start any new diet.  Take over-the-counter and prescription medicines only as told by your doctor.  Limit alcohol to no more than 1 drink per day for nonpregnant women and 2 drinks per day for men. One drink equals 12 oz of beer, 5 oz of wine, or 1 oz of hard liquor.  Keep all follow-up visits as told by your doctor. This is important. If You Have Diabetes:   Make sure you know the symptoms of low blood sugar.  Always keep a source of sugar with you, such as: ? Sugar. ? Sugar tablets. ? Glucose gel. ? Fruit juice. ? Regular soda (not diet soda). ? Milk. ? Hard candy. ? Honey.  Take your medicines as told.  Follow your exercise and meal plan. ? Eat on time. Do not skip meals. ? Follow your sick day plan when you cannot eat or drink normally. Make this plan ahead of time with your doctor.  Check your blood sugar as often  as told by your doctor. Always check before and after exercise. °· Share your diabetes care plan with: °? Your work or school. °? People you live with. °· Check your pee (urine) for ketones: °? When you are sick. °? As told by your doctor. °· Carry a card or wear jewelry that says you have diabetes. °If You Have Low Blood Sugar From Other Causes: ° °· Check your blood sugar as often as told by your doctor. °· Follow instructions from your doctor about what you cannot eat or drink. °Contact a doctor if: °· You have trouble keeping your blood sugar in your target range. °· You have low blood sugar often. °Get help right away if: °· You still have  symptoms after you eat or drink something sugary. °· Your blood sugar is at or below 54 mg/dL (3 mmol/L). °· You have jerky movements that you cannot control. °· You pass out. °These symptoms may be an emergency. Do not wait to see if the symptoms will go away. Get medical help right away. Call your local emergency services (911 in the [place] Do not drive yourself to the hospital. °This information is not intended to replace advice given to you by your health care provider. Make sure you discuss any questions you have with your health care provider. °Document Released: 08/01/2009 Document Revised: 10/13/2015 Document Reviewed: 06/10/2015 °Elsevier Interactive Patient Education © 2018 [employer] ° ° °Near-Syncope °Near-syncope is when you suddenly get weak or dizzy, or you feel like you might pass out (faint). During an episode of near-syncope, you may: °· Feel dizzy or light-headed. °· Feel sick to your stomach (nauseous). °· See all white or all black. °· Have cold, clammy skin. ° °If you passed out, get help right away.Call your local emergency services (911 in the [place] Do not drive yourself to the hospital. °Follow these instructions at home: °Pay attention to any changes in your symptoms. Take these actions to help with your condition: °· Have someone stay with you until you feel stable. °· Do not drive, use machinery, or play sports until your doctor says it is okay. °· Keep all follow-up visits as told by your doctor. This is important. °· If you start to feel like you might pass out, lie down right away and raise (elevate) your feet above the level of your heart. Breathe deeply and steadily. Wait until all of the symptoms are gone. °· Drink enough fluid to keep your pee (urine) clear or pale yellow. °· If you are taking blood pressure or heart medicine, get up slowly and spend many minutes getting ready to sit and then stand. This can help with dizziness. °· Take over-the-counter and prescription  medicines only as told by your doctor. ° °Get help right away if: °· You have a very bad headache. °· You have unusual pain in your chest, tummy, or back. °· You are bleeding from your mouth or rectum. °· You have black or tarry poop (stool). °· You have a very fast or uneven heartbeat (palpitations). °· You pass out one time or more than once. °· You have jerky movements that you cannot control (seizure). °· You are confused. °· You have trouble walking. °· You are very weak. °· You have vision problems. °These symptoms may be an emergency. Do not wait to see if the symptoms will go away. Get medical help right away. Call your local emergency services (911 in the [place] Do not drive yourself to the hospital. °  This information is not intended to replace advice given to you by your health care provider. Make sure you discuss any questions you have with your health care provider. °Document Released: 10/24/2007 Document Revised: 10/13/2015 Document Reviewed: 01/19/2015 °Elsevier Interactive Patient Education © 2017 Elsevier Inc. ° °

## 2017-02-28 NOTE — MAU Provider Note (Signed)
Obstetric Attending MAU Note  Chief Complaint:  Fall   First Provider Initiated Contact with Patient 02/28/17 2259     HPI: Jackie Chavez is a 21 y.o. G1P0 at [redacted]w[redacted]d with DC/DA TIUP who presents to maternity admissions reporting dizziness today and h/o hypoglycemia. Has not eaten much except chips and gatorade. Works night shift at [employer] and did not sleep much today. Noted that she did not improve after eating and came in for eval. She reports falling on right knee and hip. Denies hitting abdomen. Denies contractions, leakage of fluid or vaginal bleeding. Good fetal movement x 2.   Pregnancy Course: Receives care at [facility] Patient Active Problem List   Diagnosis Date Noted  . Transient hypertension of pregnancy 01/28/2017  . Rubella non-immune status, antepartum 01/07/2017  . Supervision of high risk pregnancy, antepartum 12/18/2016  . Twin gestation in second trimester 10/21/2016   Appropriately grown babies on 02/21/17  Past Medical History:  Diagnosis Date  . Anxiety   . Headache    migraines  . Hx of chlamydia infection 10/17/2016    OB History  Gravida Para Term Preterm AB Living  1            SAB TAB Ectopic Multiple Live Births               # Outcome Date GA Lbr Len/2nd Weight Sex Delivery Anes PTL Lv  1 Current               Past Surgical History:  Procedure Laterality Date  . NO PAST SURGERIES      Family History: Family History  Problem Relation Age of Onset  . COPD Mother   . Diabetes Mother   . Mental illness Mother   . Alcohol abuse Mother   . Drug abuse Mother   . Diabetes Father   . Hypertension Father   . Alcohol abuse Father   . Drug abuse Father     Social History: Social History  Substance Use Topics  . Smoking status: Former Games developer  . Smokeless tobacco: Never Used  . Alcohol use No    Allergies: No Known Allergies  Prescriptions Prior to Admission  Medication Sig Dispense Refill Last Dose  . Prenatal Vit-Fe Fumarate-FA  (PRENATAL MULTIVITAMIN) TABS tablet Take 1 tablet by mouth daily.   Past Month at Unknown time  . ondansetron (ZOFRAN ODT) 4 MG disintegrating tablet Take 1 tablet (4 mg total) by mouth every 6 (six) hours as needed for nausea. (Patient not taking: Reported on 02/12/2017) 20 tablet 1 Not Taking  . Prenatal Vit-Fe Phos-FA-Omega (VITAFOL GUMMIES) 3.33-0.333-34.8 MG CHEW Chew 1 tablet by mouth daily. 90 tablet 3     ROS: Pertinent findings in history of present illness.  Physical Exam  Blood pressure 128/70, pulse (!) 101, temperature 98 F (36.7 C), temperature source Oral, resp. rate 20, height  (1.575 m), weight 223 lb (101.2 kg), last menstrual period 09/06/2016. CONSTITUTIONAL: Well-developed, well-nourished female in no acute distress.  HENT:  Normocephalic, atraumatic, External right and left ear normal. Oropharynx is clear and moist EYES: Conjunctivae and EOM are normal. No scleral icterus.  NECK: Normal range of motion, supple, no masses SKIN: Skin is warm and dry. No rash noted. Not diaphoretic. No erythema. No pallor. NEUROLGIC: Alert and oriented to person, place, and time. Normal reflexes, muscle tone coordination.Marland Kitchen PSYCHIATRIC: Normal mood and affect. Normal behavior. Normal judgment and thought content. CARDIOVASCULAR: Normal heart rate noted, regular rhythm RESPIRATORY: Effort and  breath sounds normal, no problems with respiration noted ABDOMEN: Soft, nontender, nondistended, gravid appropriate for gestational age MUSCULOSKELETAL: Normal range of motion. No edema and no tenderness. 2+ distal pulses.   FHT: A Baseline 135 , moderate variability, accelerations present, no decelerations B Baseline 140 , moderate variability, accelerations present, no decelerations  Contractions: None   Labs: Results for orders placed or performed during the hospital encounter of 02/28/17 (from the past 24 hour(s))  Urinalysis, Routine w reflex microscopic     Status: Abnormal   Collection  Time: 02/28/17 10:43 PM  Result Value Ref Range   Color, Urine YELLOW YELLOW   APPearance HAZY (A) CLEAR   Specific Gravity, Urine 1.029 1.005 - 1.030   pH 6.0 5.0 - 8.0   Glucose, UA NEGATIVE NEGATIVE mg/dL   Hgb urine dipstick NEGATIVE NEGATIVE   Bilirubin Urine NEGATIVE NEGATIVE   Ketones, ur NEGATIVE NEGATIVE mg/dL   Protein, ur 30 (A) NEGATIVE mg/dL   Nitrite NEGATIVE NEGATIVE   Leukocytes, UA NEGATIVE NEGATIVE   RBC / HPF 0-5 0 - 5 RBC/hpf   WBC, UA 0-5 0 - 5 WBC/hpf   Bacteria, UA RARE (A) NONE SEEN   Squamous Epithelial / LPF 6-30 (A) NONE SEEN   Mucus PRESENT     MAU Course: Nml vitals, normal FHR x 2 No injuries from fall No emergent condition.   Assessment: 1. Postural dizziness with presyncope   2. Supervision of high risk pregnancy, antepartum   3. Dichorionic diamniotic twin pregnancy in second trimester     Plan: Discharge home Labor precautions and fetal kick counts reviewed Follow up with OB provider Increase sleep  Increase food intake and add protein Syncopal precautions-written given to patient.  Follow-up Information    [facility] Follow up.   Specialty:  Obstetrics and Gynecology Why:  Keep next scheduled appointment Contact information: [address] [place] [address] [phone]          Allergies as of 02/28/2017   No Known Allergies     Medication List    TAKE these medications   ondansetron 4 MG disintegrating tablet Commonly known as:  ZOFRAN ODT Take 1 tablet (4 mg total) by mouth every 6 (six) hours as needed for nausea.   prenatal multivitamin Tabs tablet Take 1 tablet by mouth daily.   VITAFOL GUMMIES 3.33-0.333-34.8 MG Chew Chew 1 tablet by mouth daily.       Reva Bores, MD 02/28/2017 11:16 PM

## 2017-02-28 NOTE — MAU Note (Signed)
Pt states she felt sluggish and woozy-states she fell on right side around 730. States she has hypoglycemia-felt better after she ate. Pt denies bleeding, LOF. Reports good fetal movement.

## 2017-03-04 ENCOUNTER — Ambulatory Visit (INDEPENDENT_AMBULATORY_CARE_PROVIDER_SITE_OTHER): Payer: Medicaid Other | Admitting: Obstetrics and Gynecology

## 2017-03-04 ENCOUNTER — Other Ambulatory Visit (HOSPITAL_COMMUNITY)
Admission: RE | Admit: 2017-03-04 | Discharge: 2017-03-04 | Disposition: A | Discharge status: Home / Self Care | Payer: Medicaid Other | Source: Ambulatory Visit | Attending: Obstetrics and Gynecology | Admitting: Obstetrics and Gynecology

## 2017-03-04 VITALS — BP 129/82 | HR 91 | Wt 222.0 lb

## 2017-03-04 DIAGNOSIS — N898 Other specified noninflammatory disorders of vagina: Secondary | ICD-10-CM | POA: Insufficient documentation

## 2017-03-04 DIAGNOSIS — O0990 Supervision of high risk pregnancy, unspecified, unspecified trimester: Secondary | ICD-10-CM

## 2017-03-04 DIAGNOSIS — O26892 Other specified pregnancy related conditions, second trimester: Secondary | ICD-10-CM

## 2017-03-04 DIAGNOSIS — O26899 Other specified pregnancy related conditions, unspecified trimester: Secondary | ICD-10-CM

## 2017-03-04 DIAGNOSIS — O30042 Twin pregnancy, dichorionic/diamniotic, second trimester: Secondary | ICD-10-CM

## 2017-03-04 DIAGNOSIS — B9689 Other specified bacterial agents as the cause of diseases classified elsewhere: Secondary | ICD-10-CM | POA: Insufficient documentation

## 2017-03-04 DIAGNOSIS — Z23 Encounter for immunization: Secondary | ICD-10-CM

## 2017-03-04 DIAGNOSIS — O0992 Supervision of high risk pregnancy, unspecified, second trimester: Secondary | ICD-10-CM

## 2017-03-04 DIAGNOSIS — O132 Gestational [pregnancy-induced] hypertension without significant proteinuria, second trimester: Secondary | ICD-10-CM

## 2017-03-04 DIAGNOSIS — B373 Candidiasis of vulva and vagina: Secondary | ICD-10-CM | POA: Insufficient documentation

## 2017-03-04 LAB — POCT FERNING: Ferning, POC: NEGATIVE

## 2017-03-04 NOTE — Addendum Note (Signed)
Addended by: [name] on: 03/04/2017 02:52 PM   Modules accepted: Orders

## 2017-03-04 NOTE — Progress Notes (Signed)
Subjective:  Jackie Chavez is a 21 y.o. G1P0 at [redacted]w[redacted]d being seen today for ongoing prenatal care.  She is currently monitored for the following issues for this high-risk pregnancy and has Twin gestation in second trimester; Supervision of high risk pregnancy, antepartum; Rubella non-immune status, antepartum; Transient hypertension of pregnancy; and Vaginal discharge during pregnancy on her problem list.  Patient reports Had some watery discharge on Sat, none since, no bleeding, occ ut ctx.  Contractions: Not present. Vag. Bleeding: None.  Movement: Present. Denies leaking of fluid.   The following portions of the patient's history were reviewed and updated as appropriate: allergies, current medications, past family history, past medical history, past social history, past surgical history and problem list. Problem list updated.  Objective:   Vitals:   03/04/17 1342  BP: 129/82  Pulse: 91  Weight: 222 lb (100.7 kg)    Fetal Status:     Movement: Present     General:  Alert, oriented and cooperative. Patient is in no acute distress.  Skin: Skin is warm and dry. No rash noted.   Cardiovascular: Normal heart rate noted  Respiratory: Normal respiratory effort, no problems with respiration noted  Abdomen: Soft, gravid, appropriate for gestational age. Pain/Pressure: Present     Pelvic:  Cervical exam performed        Extremities: Normal range of motion.  Edema: Trace  Mental Status: Normal mood and affect. Normal behavior. Normal judgment and thought content.   Urinalysis:      Assessment and Plan:  Pregnancy: G1P0 at [redacted]w[redacted]d  1. Supervision of high risk pregnancy, antepartum Stable 28 week labs next visit  2. Need for diphtheria-tetanus-pertussis (Tdap) vaccine  - Tdap vaccine greater than or equal to 7yo IM  3. Dichorionic diamniotic twin pregnancy in second trimester Stable Good growth on U/S 02/21/17 Schedule F/U first of November  4. Transient hypertension of pregnancy in  second trimester BP stable today  5. Vaginal discharge during pregnancy in second trimester No pooling, fern and Nitrazine negative - POCT [place] - Cervicovaginal ancillary only  Preterm labor symptoms and general obstetric precautions including but not limited to vaginal bleeding, contractions, leaking of fluid and fetal movement were reviewed in detail with the patient. Please refer to After Visit Summary for other counseling recommendations.  Return in about 3 years (around 03/04/2020) for OB visit.   [name], MD

## 2017-03-05 LAB — CERVICOVAGINAL ANCILLARY ONLY
Bacterial vaginitis: POSITIVE — AB
Candida vaginitis: POSITIVE — AB
Chlamydia: NEGATIVE
NEISSERIA GONORRHEA: NEGATIVE
TRICH (WINDOWPATH): NEGATIVE

## 2017-03-07 ENCOUNTER — Telehealth: Payer: Self-pay

## 2017-03-07 ENCOUNTER — Other Ambulatory Visit: Payer: Self-pay

## 2017-03-07 MED ORDER — METRONIDAZOLE 500 MG PO TABS: 500.0000 mg | ORAL_TABLET | Freq: Two times a day (BID) | ORAL | 0 refills | Status: AC

## 2017-03-07 MED ORDER — TERCONAZOLE 0.4 % VA CREA: 1.0000 | TOPICAL_CREAM | Freq: Every day | VAGINAL | 0 refills | Status: AC

## 2017-03-07 NOTE — Telephone Encounter (Signed)
-----   Message from Michael L ErvinHermina Staggers, MD sent at 03/07/2017  7:59 AM EDT ----- Please treat pt's BV with Flagyl 500 mg po bid x7 days Please treat pt's yeast with Terazol 1 applicator qhs x 7 days Thanks Casimiro NeedleMichael

## 2017-03-07 NOTE — Telephone Encounter (Signed)
Left VM message to call office.

## 2017-03-21 ENCOUNTER — Telehealth: Payer: Self-pay

## 2017-03-21 NOTE — Telephone Encounter (Signed)
Attempted to call Ms. Raul Dellston in order to reschedule her an appointment.  A gentleman answered the phone and when I asked for Ms. Raul Dellston they hung up.  I also tried to contact her by the number that is on her PMH form but had to leave a message.

## 2017-03-26 ENCOUNTER — Ambulatory Visit (HOSPITAL_COMMUNITY): Admission: RE | Admit: 2017-03-26 | Payer: Medicaid Other | Source: Ambulatory Visit

## 2017-04-01 ENCOUNTER — Ambulatory Visit (INDEPENDENT_AMBULATORY_CARE_PROVIDER_SITE_OTHER): Payer: Medicaid Other | Admitting: Obstetrics and Gynecology

## 2017-04-01 VITALS — BP 132/79 | HR 79 | Wt 223.0 lb

## 2017-04-01 DIAGNOSIS — Z283 Underimmunization status: Secondary | ICD-10-CM

## 2017-04-01 DIAGNOSIS — O9989 Other specified diseases and conditions complicating pregnancy, childbirth and the puerperium: Secondary | ICD-10-CM

## 2017-04-01 DIAGNOSIS — O30043 Twin pregnancy, dichorionic/diamniotic, third trimester: Secondary | ICD-10-CM

## 2017-04-01 DIAGNOSIS — O30049 Twin pregnancy, dichorionic/diamniotic, unspecified trimester: Secondary | ICD-10-CM

## 2017-04-01 DIAGNOSIS — O0993 Supervision of high risk pregnancy, unspecified, third trimester: Secondary | ICD-10-CM

## 2017-04-01 DIAGNOSIS — O0990 Supervision of high risk pregnancy, unspecified, unspecified trimester: Secondary | ICD-10-CM

## 2017-04-01 DIAGNOSIS — O09899 Supervision of other high risk pregnancies, unspecified trimester: Secondary | ICD-10-CM

## 2017-04-01 MED ORDER — COMFORT FIT MATERNITY SUPP MED MISC: 0 refills | Status: DC

## 2017-04-01 MED ORDER — AZITHROMYCIN 250 MG PO TABS: ORAL_TABLET | ORAL | 0 refills | Status: DC

## 2017-04-01 MED ORDER — METRONIDAZOLE 500 MG PO TABS: 500.0000 mg | ORAL_TABLET | Freq: Two times a day (BID) | ORAL | 0 refills | Status: DC

## 2017-04-01 MED ORDER — TERCONAZOLE 0.4 % VA CREA: 1.0000 | TOPICAL_CREAM | Freq: Every day | VAGINAL | 0 refills | Status: DC

## 2017-04-01 NOTE — Progress Notes (Signed)
Pt c/o lumbago and lower abdominal pain, requesting maternity belt.

## 2017-04-01 NOTE — Progress Notes (Signed)
   PRENATAL VISIT NOTE  Subjective:  Jackie Chavez is a 21 y.o. G1P0 at 2856w4d being seen today for ongoing prenatal care.  She is currently monitored for the following issues for this high-risk pregnancy and has Twin gestation in second trimester; Supervision of high risk pregnancy, antepartum; Rubella non-immune status, antepartum; Transient hypertension of pregnancy; Vaginal discharge during pregnancy; and Dichorionic diamniotic twin pregnancy, antepartum on their problem list.  Patient reports backache.  Contractions: Not present. Vag. Bleeding: None.  Movement: Present. Denies leaking of fluid.   The following portions of the patient's history were reviewed and updated as appropriate: allergies, current medications, past family history, past medical history, past social history, past surgical history and problem list. Problem list updated.  Objective:   Vitals:   04/01/17 1605  BP: 132/79  Pulse: 79  Weight: 223 lb (101.2 kg)    Fetal Status: Fetal Heart Rate (bpm): A 146; B 146   Movement: Present     General:  Alert, oriented and cooperative. Patient is in no acute distress.  Skin: Skin is warm and dry. No rash noted.   Cardiovascular: Normal heart rate noted  Respiratory: Normal respiratory effort, no problems with respiration noted  Abdomen: Soft, gravid, appropriate for gestational age.  Pain/Pressure: Present     Pelvic: Cervical exam deferred        Extremities: Normal range of motion.  Edema: Trace  Mental Status:  Normal mood and affect. Normal behavior. Normal judgment and thought content.   Assessment and Plan:  Pregnancy: G1P0 at 356w4d  1. Supervision of high risk pregnancy, antepartum Patient is doing well without complaints Rx maternity belt provided to help with backache Third trimester labs, glucola on 11/15 Patient with sinusitis- Rx z-pack provided  2. Rubella non-immune status, antepartum Will offer pp  3. Dichorionic diamniotic twin pregnancy,  antepartum Follow up growth ultrasound ordered - US MFM OB FOLLOW UP; Future - US MFM OB FOLLOW UP ADDL GEST; Future  Preterm labor symptoms and general obstetric precautions including but not limited to vaginal bleeding, contractions, leaking of fluid and fetal movement were reviewed in detail with the patient. Please refer to After Visit Summary for other counseling recommendations.  Return in about 2 weeks (around 04/15/2017) for ROB.   Catalina AntiguaPeggy Kohan Azizi, MD

## 2017-04-04 ENCOUNTER — Other Ambulatory Visit: Payer: Medicaid Other

## 2017-04-08 ENCOUNTER — Ambulatory Visit (INDEPENDENT_AMBULATORY_CARE_PROVIDER_SITE_OTHER): Payer: Medicaid Other | Admitting: Obstetrics and Gynecology

## 2017-04-08 ENCOUNTER — Other Ambulatory Visit: Payer: Medicaid Other

## 2017-04-08 ENCOUNTER — Encounter: Payer: Self-pay | Admitting: Obstetrics and Gynecology

## 2017-04-08 VITALS — BP 132/77 | HR 85 | Wt 230.0 lb

## 2017-04-08 DIAGNOSIS — O9989 Other specified diseases and conditions complicating pregnancy, childbirth and the puerperium: Secondary | ICD-10-CM

## 2017-04-08 DIAGNOSIS — O0990 Supervision of high risk pregnancy, unspecified, unspecified trimester: Secondary | ICD-10-CM

## 2017-04-08 DIAGNOSIS — Z283 Underimmunization status: Secondary | ICD-10-CM

## 2017-04-08 DIAGNOSIS — O09899 Supervision of other high risk pregnancies, unspecified trimester: Secondary | ICD-10-CM

## 2017-04-08 DIAGNOSIS — O30049 Twin pregnancy, dichorionic/diamniotic, unspecified trimester: Secondary | ICD-10-CM

## 2017-04-08 NOTE — Progress Notes (Signed)
   PRENATAL VISIT NOTE  Subjective:  Jackie Chavez is a 21 y.o. G1P0 at 417w4d being seen today for ongoing prenatal care.  She is currently monitored for the following issues for this high-risk pregnancy and has Twin gestation in second trimester; Supervision of high risk pregnancy, antepartum; Rubella non-immune status, antepartum; Transient hypertension of pregnancy; Vaginal discharge during pregnancy; and Dichorionic diamniotic twin pregnancy, antepartum on their problem list.  Patient reports vaginal irritation. Patient is currently being treated for a yeast infection and admits to vaginal pruritis. She also reports some dysuria  Contractions: Irregular. Vag. Bleeding: None.  Movement: Present. Denies leaking of fluid.   The following portions of the patient's history were reviewed and updated as appropriate: allergies, current medications, past family history, past medical history, past social history, past surgical history and problem list. Problem list updated.  Objective:   Vitals:   04/08/17 1031  BP: 132/77  Pulse: 85  Weight: 230 lb (104.3 kg)    Fetal Status:     Movement: Present     General:  Alert, oriented and cooperative. Patient is in no acute distress.  Skin: Skin is warm and dry. No rash noted.   Cardiovascular: Normal heart rate noted  Respiratory: Normal respiratory effort, no problems with respiration noted  Abdomen: Soft, gravid, appropriate for gestational age.  Pain/Pressure: Present     Pelvic: Cervical exam performed        Extremities: Normal range of motion.  Edema: None  Mental Status:  Normal mood and affect. Normal behavior. Normal judgment and thought content.   Assessment and Plan:  Pregnancy: G1P0 at 6617w4d  1. Dichorionic diamniotic twin pregnancy, antepartum Follow up growth ultrasound on 11/23  2. Supervision of high risk pregnancy, antepartum Patient is doing well Urine culture ordered Third trimester labs and glucola today - Glucose  Tolerance, 2 Hours w/1 Hour - CBC - HIV antibody - RPR - Culture, OB Urine  3. Rubella non-immune status, antepartum Will offer pp  Preterm labor symptoms and general obstetric precautions including but not limited to vaginal bleeding, contractions, leaking of fluid and fetal movement were reviewed in detail with the patient. Please refer to After Visit Summary for other counseling recommendations.  No Follow-up on file.   Catalina AntiguaPeggy Riata Ikeda, MD

## 2017-04-08 NOTE — Progress Notes (Signed)
Pt c/o dysuria and requests Ampicillin.

## 2017-04-09 LAB — CBC
HEMATOCRIT: 31.3 % — AB (ref 34.0–46.6)
HEMOGLOBIN: 10.4 g/dL — AB (ref 11.1–15.9)
MCH: 28.8 pg (ref 26.6–33.0)
MCHC: 33.2 g/dL (ref 31.5–35.7)
MCV: 87 fL (ref 79–97)
Platelets: 184 10*3/uL (ref 150–379)
RBC: 3.61 x10E6/uL — ABNORMAL LOW (ref 3.77–5.28)
RDW: 14 % (ref 12.3–15.4)
WBC: 7.8 10*3/uL (ref 3.4–10.8)

## 2017-04-09 LAB — GLUCOSE TOLERANCE, 2 HOURS W/ 1HR
GLUCOSE, 2 HOUR: 108 mg/dL (ref 65–152)
Glucose, 1 hour: 145 mg/dL (ref 65–179)
Glucose, Fasting: 88 mg/dL (ref 65–91)

## 2017-04-09 LAB — HIV ANTIBODY (ROUTINE TESTING W REFLEX): HIV SCREEN 4TH GENERATION: NONREACTIVE

## 2017-04-09 LAB — RPR: RPR: NONREACTIVE

## 2017-04-10 LAB — URINE CULTURE, OB REFLEX

## 2017-04-10 LAB — CULTURE, OB URINE

## 2017-04-12 ENCOUNTER — Encounter (HOSPITAL_COMMUNITY): Payer: Self-pay

## 2017-04-12 ENCOUNTER — Other Ambulatory Visit (HOSPITAL_COMMUNITY): Payer: Self-pay | Admitting: *Deleted

## 2017-04-12 ENCOUNTER — Ambulatory Visit (HOSPITAL_COMMUNITY)
Admission: RE | Admit: 2017-04-12 | Discharge: 2017-04-12 | Disposition: A | Discharge status: Home / Self Care | Payer: Medicaid Other | Source: Ambulatory Visit | Attending: Obstetrics and Gynecology | Admitting: Obstetrics and Gynecology

## 2017-04-12 ENCOUNTER — Other Ambulatory Visit: Payer: Self-pay | Admitting: Obstetrics and Gynecology

## 2017-04-12 DIAGNOSIS — Z362 Encounter for other antenatal screening follow-up: Secondary | ICD-10-CM | POA: Diagnosis not present

## 2017-04-12 DIAGNOSIS — O0990 Supervision of high risk pregnancy, unspecified, unspecified trimester: Secondary | ICD-10-CM

## 2017-04-12 DIAGNOSIS — O30049 Twin pregnancy, dichorionic/diamniotic, unspecified trimester: Secondary | ICD-10-CM

## 2017-04-12 DIAGNOSIS — O30043 Twin pregnancy, dichorionic/diamniotic, third trimester: Secondary | ICD-10-CM | POA: Diagnosis not present

## 2017-04-12 DIAGNOSIS — O30042 Twin pregnancy, dichorionic/diamniotic, second trimester: Secondary | ICD-10-CM

## 2017-04-12 DIAGNOSIS — Z3A31 31 weeks gestation of pregnancy: Secondary | ICD-10-CM

## 2017-04-12 DIAGNOSIS — O321XX2 Maternal care for breech presentation, fetus 2: Secondary | ICD-10-CM | POA: Diagnosis not present

## 2017-04-12 NOTE — Addendum Note (Signed)
Encounter addended by: Genevie CheshireWaken, Jahmia Berrett M, RT on: 04/12/2017 4:29 PM  Actions taken: Order list changed

## 2017-04-16 ENCOUNTER — Encounter: Payer: Medicaid Other | Admitting: Obstetrics and Gynecology

## 2017-04-18 ENCOUNTER — Inpatient Hospital Stay (HOSPITAL_COMMUNITY)
Admission: AD | Admit: 2017-04-18 | Discharge: 2017-04-18 | Disposition: A | Discharge status: Home / Self Care | Payer: Medicaid Other | Source: Ambulatory Visit | Attending: Obstetrics & Gynecology | Admitting: Obstetrics & Gynecology

## 2017-04-18 ENCOUNTER — Encounter (HOSPITAL_COMMUNITY): Payer: Self-pay

## 2017-04-18 DIAGNOSIS — Z818 Family history of other mental and behavioral disorders: Secondary | ICD-10-CM | POA: Diagnosis not present

## 2017-04-18 DIAGNOSIS — O26893 Other specified pregnancy related conditions, third trimester: Secondary | ICD-10-CM | POA: Insufficient documentation

## 2017-04-18 DIAGNOSIS — B373 Candidiasis of vulva and vagina: Secondary | ICD-10-CM | POA: Diagnosis not present

## 2017-04-18 DIAGNOSIS — Z3689 Encounter for other specified antenatal screening: Secondary | ICD-10-CM

## 2017-04-18 DIAGNOSIS — O30049 Twin pregnancy, dichorionic/diamniotic, unspecified trimester: Secondary | ICD-10-CM

## 2017-04-18 DIAGNOSIS — B3731 Acute candidiasis of vulva and vagina: Secondary | ICD-10-CM

## 2017-04-18 DIAGNOSIS — Z87891 Personal history of nicotine dependence: Secondary | ICD-10-CM | POA: Diagnosis not present

## 2017-04-18 DIAGNOSIS — O30043 Twin pregnancy, dichorionic/diamniotic, third trimester: Secondary | ICD-10-CM

## 2017-04-18 DIAGNOSIS — Z825 Family history of asthma and other chronic lower respiratory diseases: Secondary | ICD-10-CM | POA: Insufficient documentation

## 2017-04-18 DIAGNOSIS — N939 Abnormal uterine and vaginal bleeding, unspecified: Secondary | ICD-10-CM | POA: Diagnosis present

## 2017-04-18 DIAGNOSIS — O98813 Other maternal infectious and parasitic diseases complicating pregnancy, third trimester: Secondary | ICD-10-CM | POA: Diagnosis not present

## 2017-04-18 DIAGNOSIS — O99283 Endocrine, nutritional and metabolic diseases complicating pregnancy, third trimester: Secondary | ICD-10-CM | POA: Insufficient documentation

## 2017-04-18 DIAGNOSIS — Z833 Family history of diabetes mellitus: Secondary | ICD-10-CM | POA: Insufficient documentation

## 2017-04-18 DIAGNOSIS — Z813 Family history of other psychoactive substance abuse and dependence: Secondary | ICD-10-CM | POA: Diagnosis not present

## 2017-04-18 DIAGNOSIS — Z811 Family history of alcohol abuse and dependence: Secondary | ICD-10-CM | POA: Insufficient documentation

## 2017-04-18 DIAGNOSIS — O139 Gestational [pregnancy-induced] hypertension without significant proteinuria, unspecified trimester: Secondary | ICD-10-CM | POA: Diagnosis present

## 2017-04-18 DIAGNOSIS — O0990 Supervision of high risk pregnancy, unspecified, unspecified trimester: Secondary | ICD-10-CM

## 2017-04-18 DIAGNOSIS — E860 Dehydration: Secondary | ICD-10-CM | POA: Diagnosis not present

## 2017-04-18 DIAGNOSIS — Z3A32 32 weeks gestation of pregnancy: Secondary | ICD-10-CM | POA: Diagnosis not present

## 2017-04-18 DIAGNOSIS — O133 Gestational [pregnancy-induced] hypertension without significant proteinuria, third trimester: Secondary | ICD-10-CM | POA: Diagnosis not present

## 2017-04-18 DIAGNOSIS — Z8249 Family history of ischemic heart disease and other diseases of the circulatory system: Secondary | ICD-10-CM | POA: Insufficient documentation

## 2017-04-18 DIAGNOSIS — R109 Unspecified abdominal pain: Secondary | ICD-10-CM | POA: Insufficient documentation

## 2017-04-18 HISTORY — DX: Acute vaginitis: N76.0

## 2017-04-18 HISTORY — DX: Other specified bacterial agents as the cause of diseases classified elsewhere: B96.89

## 2017-04-18 LAB — URINALYSIS, ROUTINE W REFLEX MICROSCOPIC
Bilirubin Urine: NEGATIVE
Glucose, UA: NEGATIVE mg/dL
KETONES UR: NEGATIVE mg/dL
Nitrite: NEGATIVE
PH: 6 (ref 5.0–8.0)
Protein, ur: NEGATIVE mg/dL
SPECIFIC GRAVITY, URINE: 1.014 (ref 1.005–1.030)

## 2017-04-18 LAB — COMPREHENSIVE METABOLIC PANEL
ALK PHOS: 194 U/L — AB (ref 38–126)
ALT: 14 U/L (ref 14–54)
AST: 14 U/L — ABNORMAL LOW (ref 15–41)
Albumin: 2.6 g/dL — ABNORMAL LOW (ref 3.5–5.0)
Anion gap: 6 (ref 5–15)
BUN: 7 mg/dL (ref 6–20)
CALCIUM: 8.6 mg/dL — AB (ref 8.9–10.3)
CO2: 21 mmol/L — AB (ref 22–32)
CREATININE: 0.6 mg/dL (ref 0.44–1.00)
Chloride: 107 mmol/L (ref 101–111)
GFR calc non Af Amer: >60 mL/min (ref >60)
Glucose, Bld: 86 mg/dL (ref 65–99)
Potassium: 4.3 mmol/L (ref 3.5–5.1)
SODIUM: 134 mmol/L — AB (ref 135–145)
Total Bilirubin: 0.2 mg/dL — ABNORMAL LOW (ref 0.3–1.2)
Total Protein: 6.3 g/dL — ABNORMAL LOW (ref 6.5–8.1)

## 2017-04-18 LAB — CBC
HEMATOCRIT: 31.6 % — AB (ref 36.0–46.0)
HEMOGLOBIN: 10.3 g/dL — AB (ref 12.0–15.0)
MCH: 28.7 pg (ref 26.0–34.0)
MCHC: 32.6 g/dL (ref 30.0–36.0)
MCV: 88 fL (ref 78.0–100.0)
Platelets: 199 10*3/uL (ref 150–400)
RBC: 3.59 MIL/uL — AB (ref 3.87–5.11)
RDW: 13.9 % (ref 11.5–15.5)
WBC: 7.4 10*3/uL (ref 4.0–10.5)

## 2017-04-18 LAB — PROTEIN / CREATININE RATIO, URINE
CREATININE, URINE: 152 mg/dL
PROTEIN CREATININE RATIO: 0.16 mg/mg{creat} — AB (ref 0.00–0.15)
TOTAL PROTEIN, URINE: 25 mg/dL

## 2017-04-18 MED ORDER — FLUCONAZOLE 150 MG PO TABS: 150.0000 mg | ORAL_TABLET | Freq: Once | ORAL | 0 refills | Status: DC

## 2017-04-18 MED ORDER — FLUCONAZOLE 150 MG PO TABS: 150.0000 mg | ORAL_TABLET | Freq: Once | ORAL | 0 refills | Status: AC

## 2017-04-18 NOTE — MAU Provider Note (Signed)
History     CSN: [id number]  Arrival date and time: 04/18/17 [id number]   First Provider Initiated Contact with Patient 04/18/17 1031      Chief Complaint  Patient presents with  . Vaginal Bleeding  . Abdominal Pain   G1 @32 .0 wks with didi twins here with cramping and spotting. Cramping started last night in lower abdomen. She took 1/2 Xanax for panic attack last night and went to sleep. Cramping started again this am. She also noticed red blood on the toilet paper when she wiped. She is currently being treated for yeast and BV, taking Flagyl and using external antifungal cream. Reports extreme external burning and irritation. No recent IC. Reports no FM this am. No LOF or ctx. Denies urinary sx. Has not eaten or drank today.   OB History    Gravida Para Term Preterm AB Living   1             SAB TAB Ectopic Multiple Live Births                  Past Medical History:  Diagnosis Date  . Anxiety   . BV (bacterial vaginosis)   . Headache    migraines  . Hx of chlamydia infection 10/17/2016    Past Surgical History:  Procedure Laterality Date  . NO PAST SURGERIES      Family History  Problem Relation Age of Onset  . COPD Mother   . Diabetes Mother   . Mental illness Mother   . Alcohol abuse Mother   . Drug abuse Mother   . Diabetes Father   . Hypertension Father   . Alcohol abuse Father   . Drug abuse Father     Social History   Tobacco Use  . Smoking status: Former Games developermoker  . Smokeless tobacco: Never Used  Substance Use Topics  . Alcohol use: No  . Drug use: No    Allergies: No Known Allergies  No medications prior to admission.    Review of Systems  Constitutional: Negative for fever.  Eyes: Negative for visual disturbance.  Gastrointestinal: Positive for abdominal pain.  Genitourinary: Positive for vaginal bleeding and vaginal discharge. Negative for dysuria, frequency, hematuria and urgency.  Neurological: Negative for headaches.   Physical Exam    Blood pressure (!) 146/87, pulse 85, temperature 98.2 F (36.8 C), temperature source Oral, resp. rate 16, last menstrual period 09/06/2016, SpO2 98 %.  Patient Vitals for the past 24 hrs:  BP Temp Temp src Pulse Resp SpO2  04/18/17 1203 - - - - 16 -  04/18/17 1131 (!) 146/87 - - 85 - -  04/18/17 1115 (!) 155/83 - - 91 - -  04/18/17 1106 - - - - - 98 %  04/18/17 1105 - - - - - 100 %  04/18/17 1100 (!) 159/83 - - 92 - 99 %  04/18/17 1055 - - - - - 100 %  04/18/17 1050 - - - - - 100 %  04/18/17 1046 133/89 - - (!) 114 - -  04/18/17 1045 - - - - - 98 %  04/18/17 1040 - - - - - 100 %  04/18/17 1035 - - - - - 99 %  04/18/17 1031 133/71 - - (!) 103 - -  04/18/17 1030 - - - - - 100 %  04/18/17 1028 (!) 150/90 - - 98 - -  04/18/17 1025 - - - - - 99 %  04/18/17 1021 (!) 146/87 - -  98 - -  04/18/17 1020 - 98.2 F (36.8 C) Oral - 16 99 %    Physical Exam  Constitutional: She is oriented to person, place, and time. She appears well-developed and well-nourished. No distress.  HENT:  Head: Normocephalic and atraumatic.  Neck: Normal range of motion.  Respiratory: No respiratory distress.  GI: Soft. She exhibits no distension. There is no tenderness.  gravid  Genitourinary:  Genitourinary Comments: External: superficial fissures near introitus; erythema and mild edema present Vagina: rugated, pink, moist, copious thick adherent white curdy discharge Cervix closed/thick   Musculoskeletal: Normal range of motion.  Neurological: She is alert and oriented to person, place, and time.  Skin: Skin is warm and dry.  Psychiatric: Her mood appears anxious.  EFM-A: 140 bpm, mod variability, + accels, no decels EFM-B: 135 bpm, mod variability, + accels, no decels Toco: none  Results for orders placed or performed during the hospital encounter of 04/18/17 (from the past 24 hour(s))  Urinalysis, Routine w reflex microscopic     Status: Abnormal   Collection Time: 04/18/17 10:05 AM  Result  Value Ref Range   Color, Urine YELLOW YELLOW   APPearance HAZY (A) CLEAR   Specific Gravity, Urine 1.014 1.005 - 1.030   pH 6.0 5.0 - 8.0   Glucose, UA NEGATIVE NEGATIVE mg/dL   Hgb urine dipstick SMALL (A) NEGATIVE   Bilirubin Urine NEGATIVE NEGATIVE   Ketones, ur NEGATIVE NEGATIVE mg/dL   Protein, ur NEGATIVE NEGATIVE mg/dL   Nitrite NEGATIVE NEGATIVE   Leukocytes, UA MODERATE (A) NEGATIVE   RBC / HPF 6-30 0 - 5 RBC/hpf   WBC, UA 0-5 0 - 5 WBC/hpf   Bacteria, UA RARE (A) NONE SEEN   Squamous Epithelial / LPF 0-5 (A) NONE SEEN   Mucus PRESENT   Protein / creatinine ratio, urine     Status: Abnormal   Collection Time: 04/18/17 10:05 AM  Result Value Ref Range   Creatinine, Urine 152.00 mg/dL   Total Protein, Urine 25 mg/dL   Protein Creatinine Ratio 0.16 (H) 0.00 - 0.15 mg/mg[Cre]  CBC     Status: Abnormal   Collection Time: 04/18/17 11:01 AM  Result Value Ref Range   WBC 7.4 4.0 - 10.5 K/uL   RBC 3.59 (L) 3.87 - 5.11 MIL/uL   Hemoglobin 10.3 (L) 12.0 - 15.0 g/dL   HCT 16.1 (L) 09.6 - 04.5 %   MCV 88.0 78.0 - 100.0 fL   MCH 28.7 26.0 - 34.0 pg   MCHC 32.6 30.0 - 36.0 g/dL   RDW 40.9 81.1 - 91.4 %   Platelets 199 150 - 400 K/uL  Comprehensive metabolic panel     Status: Abnormal   Collection Time: 04/18/17 11:01 AM  Result Value Ref Range   Sodium 134 (L) 135 - 145 mmol/L   Potassium 4.3 3.5 - 5.1 mmol/L   Chloride 107 101 - 111 mmol/L   CO2 21 (L) 22 - 32 mmol/L   Glucose, Bld 86 65 - 99 mg/dL   BUN 7 6 - 20 mg/dL   Creatinine, Ser 7.82 0.44 - 1.00 mg/dL   Calcium 8.6 (L) 8.9 - 10.3 mg/dL   Total Protein 6.3 (L) 6.5 - 8.1 g/dL   Albumin 2.6 (L) 3.5 - 5.0 g/dL   AST 14 (L) 15 - 41 U/L   ALT 14 14 - 54 U/L   Alkaline Phosphatase 194 (H) 38 - 126 U/L   Total Bilirubin 0.2 (L) 0.3 - 1.2 mg/dL  GFR calc non Af Amer >60 >60 mL/min   GFR calc Af Amer >60 >60 mL/min   Anion gap 6 5 - 15   MAU Course  Procedures Po hydration  MDM Review of chart shows mild range  HTN on 3 other occasions and again today >pre-e labs ordered. No evidence of pre-e. Pt feeling less cramping after drinking a pitcher of water. Discussed with pt antenatal testing and delivery at 37 wks d/t gHTN. Message sent to [place] admin pool to scheduled appts for fetal surveillance. Will treat yeast with Diflucan (pharmacy did not give her applicators for Terazol). Discussed with pt Xanax is Cat D and not safe in pregnancy and should d/c. She can discuss other options for managing anxiety with the provider at her next visit. Stable for discharge home.  Assessment and Plan  32 week didi pregnancy Reactive NST Dehydration Gestational HTN Yeast vaginitis  Discharge home Follow up at [facility] within 1 week- message sent Maintain hydration- drink 6 bottles of water daily Rx Diflucan  Allergies as of 04/18/2017   No Known Allergies     Medication List    STOP taking these medications   ALPRAZolam 1 MG tablet Commonly known as:  XANAX   ondansetron 4 MG disintegrating tablet Commonly known as:  ZOFRAN ODT   terconazole 0.4 % vaginal cream Commonly known as:  TERAZOL 7     TAKE these medications   azithromycin 250 MG tablet Commonly known as:  ZITHROMAX Take as instructed   COMFORT FIT MATERNITY SUPP MED Misc Wear daily when ambulating   fluconazole 150 MG tablet Commonly known as:  DIFLUCAN Take 1 tablet (150 mg total) by mouth once for 1 dose. May repeat dose on day 4   metroNIDAZOLE 500 MG tablet Commonly known as:  FLAGYL Take 1 tablet (500 mg total) 2 (two) times daily by mouth.   VITAFOL GUMMIES 3.33-0.333-34.8 MG Chew Chew 1 tablet by mouth daily.      Jackie LarryMelanie Eain Chavez, CNM 04/18/2017, 12:33 PM

## 2017-04-18 NOTE — MAU Note (Addendum)
Pt had a panic attack yesterday, had some abdominal cramping and bleeding, but has since stopped bleeding. Took a xanax yesterday for the panic attack. She is now feeling cramping in her lower abdomen, back and legs.

## 2017-04-18 NOTE — Discharge Instructions (Signed)
Hypertension During Pregnancy Hypertension is also called high blood pressure. High blood pressure means that the force of your blood moving in your body is too strong. When you are pregnant, this condition should be watched carefully. It can cause problems for you and your baby. Follow these instructions at home: Eating and drinking  Drink enough fluid to keep your pee (urine) clear or pale yellow.  Eat healthy foods that are low in salt (sodium). ? Do not add salt to your food. ? Check labels on foods and drinks to see much salt is in them. Look on the label where you see "Sodium." Lifestyle  Do not use any products that contain nicotine or tobacco, such as cigarettes and e-cigarettes. If you need help quitting, ask your doctor.  Do not use alcohol.  Avoid caffeine.  Avoid stress. Rest and get plenty of sleep. General instructions  Take over-the-counter and prescription medicines only as told by your doctor.  While lying down, lie on your left side. This keeps pressure off your baby.  While sitting or lying down, raise (elevate) your feet. Try putting some pillows under your lower legs.  Exercise regularly. Ask your doctor what kinds of exercise are best for you.  Keep all prenatal and follow-up visits as told by your doctor. This is important. Contact a doctor if:  You have symptoms that your doctor told you to watch for, such as: ? Fever. ? Throwing up (vomiting). ? Headache. Get help right away if:  You have very bad pain in your belly (abdomen).  You are throwing up, and this does not get better with treatment.  You suddenly get swelling in your hands, ankles, or face.  You gain 4 lb (1.8 kg) or more in 1 week.  You get bleeding from your vagina.  You have blood in your pee.  You do not feel your baby moving as much as normal.  You have a change in vision.  You have muscle twitching or sudden tightening (spasms).  You have trouble breathing.  Your lips  or fingernails turn blue. This information is not intended to replace advice given to you by your health care provider. Make sure you discuss any questions you have with your health care provider. Document Released: 06/09/2010 Document Revised: 01/17/2016 Document Reviewed: 01/17/2016 Elsevier Interactive Patient Education  2017 [employer] Vaginal Yeast infection, Adult Vaginal yeast infection is a condition that causes soreness, swelling, and redness (inflammation) of the vagina. It also causes vaginal discharge. This is a common condition. Some women get this infection frequently. What are the causes? This condition is caused by a change in the normal balance of the yeast (candida) and bacteria that live in the vagina. This change causes an overgrowth of yeast, which causes the inflammation. What increases the risk? This condition is more likely to develop in:  Women who take antibiotic medicines.  Women who have diabetes.  Women who take birth control pills.  Women who are pregnant.  Women who douche often.  Women who have a weak defense (immune) system.  Women who have been taking steroid medicines for a long time.  Women who frequently wear tight clothing.  What are the signs or symptoms? Symptoms of this condition include:  White, thick vaginal discharge.  Swelling, itching, redness, and irritation of the vagina. The lips of the vagina (vulva) may be affected as well.  Pain or a burning feeling while urinating.  Pain during sex.  How is this diagnosed? This condition  is diagnosed with a medical history and physical exam. This will include a pelvic exam. Your health care provider will examine a sample of your vaginal discharge under a microscope. Your health care provider may send this sample for testing to confirm the diagnosis. How is this treated? This condition is treated with medicine. Medicines may be over-the-counter or prescription. You may be told to use one  or more of the following:  Medicine that is taken orally.  Medicine that is applied as a cream.  Medicine that is inserted directly into the vagina (suppository).  Follow these instructions at home:  Take or apply over-the-counter and prescription medicines only as told by your health care provider.  Do not have sex until your health care provider has approved. Tell your sex partner that you have a yeast infection. That person should go to his or her health care provider if he or she develops symptoms.  Do not wear tight clothes, such as pantyhose or tight pants.  Avoid using tampons until your health care provider approves.  Eat more yogurt. This may help to keep your yeast infection from returning.  Try taking a sitz bath to help with discomfort. This is a warm water bath that is taken while you are sitting down. The water should only come up to your hips and should cover your buttocks. Do this 3-4 times per day or as told by your health care provider.  Do not douche.  Wear breathable, cotton underwear.  If you have diabetes, keep your blood sugar levels under control. Contact a health care provider if:  You have a fever.  Your symptoms go away and then return.  Your symptoms do not get better with treatment.  Your symptoms get worse.  You have new symptoms.  You develop blisters in or around your vagina.  You have blood coming from your vagina and it is not your menstrual period.  You develop pain in your abdomen. This information is not intended to replace advice given to you by your health care provider. Make sure you discuss any questions you have with your health care provider. Document Released: 02/14/2005 Document Revised: 10/19/2015 Document Reviewed: 11/08/2014 Elsevier Interactive Patient Education  2018 [employer] Inc. Dehydration, Adult Dehydration is when there is not enough fluid or water in your body. This happens when you lose more fluids than you take  in. Dehydration can range from mild to very bad. It should be treated right away to keep it from getting very bad. Symptoms of mild dehydration may include:  Thirst.  Dry lips.  Slightly dry mouth.  Dry, warm skin.  Dizziness. Symptoms of moderate dehydration may include:  Very dry mouth.  Muscle cramps.  Dark pee (urine). Pee may be the color of tea.  Your body making less pee.  Your eyes making fewer tears.  Heartbeat that is uneven or faster than normal (palpitations).  Headache.  Light-headedness, especially when you stand up from sitting.  Fainting (syncope). Symptoms of very bad dehydration may include:  Changes in skin, such as: ? Cold and clammy skin. ? Blotchy (mottled) or pale skin. ? Skin that does not quickly return to normal after being lightly pinched and let go (poor skin turgor).  Changes in body fluids, such as: ? Feeling very thirsty. ? Your eyes making fewer tears. ? Not sweating when body temperature is high, such as in hot weather. ? Your body making very little pee.  Changes in vital signs, such as: ? Weak  pulse. ? Pulse that is more than 100 beats a minute when you are sitting still. ? Fast breathing. ? Low blood pressure.  Other changes, such as: ? Sunken eyes. ? Cold hands and feet. ? Confusion. ? Lack of energy (lethargy). ? Trouble waking up from sleep. ? Short-term weight loss. ? Unconsciousness. Follow these instructions at home:  If told by your doctor, drink an ORS: ? Make an ORS by using instructions on the package. ? Start by drinking small amounts, about  cup (120 mL) every 5-10 minutes. ? Slowly drink more until you have had the amount that your doctor said to have.  Drink enough clear fluid to keep your pee clear or pale yellow. If you were told to drink an ORS, finish the ORS first, then start slowly drinking clear fluids. Drink fluids such as: ? Water. Do not drink only water by itself. Doing that can make the  salt (sodium) level in your body get too low (hyponatremia). ? Ice chips. ? Fruit juice that you have added water to (diluted). ? Low-calorie sports drinks.  Avoid: ? Alcohol. ? Drinks that have a lot of sugar. These include high-calorie sports drinks, fruit juice that does not have water added, and soda. ? Caffeine. ? Foods that are greasy or have a lot of fat or sugar.  Take over-the-counter and prescription medicines only as told by your doctor.  Do not take salt tablets. Doing that can make the salt level in your body get too high (hypernatremia).  Eat foods that have minerals (electrolytes). Examples include bananas, oranges, potatoes, tomatoes, and spinach.  Keep all follow-up visits as told by your doctor. This is important. Contact a doctor if:  You have belly (abdominal) pain that: ? Gets worse. ? Stays in one area (localizes).  You have a rash.  You have a stiff neck.  You get angry or annoyed more easily than normal (irritability).  You are more sleepy than normal.  You have a harder time waking up than normal.  You feel: ? Weak. ? Dizzy. ? Very thirsty.  You have peed (urinated) only a small amount of very dark pee during 6-8 hours. Get help right away if:  You have symptoms of very bad dehydration.  You cannot drink fluids without throwing up (vomiting).  Your symptoms get worse with treatment.  You have a fever.  You have a very bad headache.  You are throwing up or having watery poop (diarrhea) and it: ? Gets worse. ? Does not go away.  You have blood or something green (bile) in your throw-up.  You have blood in your poop (stool). This may cause poop to look black and tarry.  You have not peed in 6-8 hours.  You pass out (faint).  Your heart rate when you are sitting still is more than 100 beats a minute.  You have trouble breathing. This information is not intended to replace advice given to you by your health care provider. Make  sure you discuss any questions you have with your health care provider. Document Released: 03/03/2009 Document Revised: 11/25/2015 Document Reviewed: 07/01/2015 Elsevier Interactive Patient Education  2018 [employer].

## 2017-04-19 LAB — CULTURE, OB URINE: Culture: 2000 — AB

## 2017-04-19 LAB — GC/CHLAMYDIA PROBE AMP (~~LOC~~) NOT AT ARMC
CHLAMYDIA, DNA PROBE: NEGATIVE
NEISSERIA GONORRHEA: NEGATIVE

## 2017-04-21 ENCOUNTER — Encounter: Payer: Self-pay | Admitting: Student

## 2017-04-21 DIAGNOSIS — R8271 Bacteriuria: Secondary | ICD-10-CM | POA: Insufficient documentation

## 2017-04-23 ENCOUNTER — Encounter: Payer: Medicaid Other | Admitting: Obstetrics & Gynecology

## 2017-04-26 ENCOUNTER — Inpatient Hospital Stay (HOSPITAL_COMMUNITY)
Admission: EM | Admit: 2017-04-26 | Discharge: 2017-04-30 | DRG: 833 | Disposition: A | Discharge status: Home / Self Care | Payer: Medicaid Other | Attending: Obstetrics & Gynecology | Admitting: Obstetrics & Gynecology

## 2017-04-26 ENCOUNTER — Other Ambulatory Visit: Payer: Self-pay

## 2017-04-26 ENCOUNTER — Encounter (HOSPITAL_COMMUNITY): Payer: Self-pay | Admitting: Emergency Medicine

## 2017-04-26 DIAGNOSIS — O113 Pre-existing hypertension with pre-eclampsia, third trimester: Principal | ICD-10-CM | POA: Diagnosis present

## 2017-04-26 DIAGNOSIS — Z23 Encounter for immunization: Secondary | ICD-10-CM

## 2017-04-26 DIAGNOSIS — D649 Anemia, unspecified: Secondary | ICD-10-CM | POA: Diagnosis present

## 2017-04-26 DIAGNOSIS — O30003 Twin pregnancy, unspecified number of placenta and unspecified number of amniotic sacs, third trimester: Secondary | ICD-10-CM | POA: Diagnosis present

## 2017-04-26 DIAGNOSIS — O0990 Supervision of high risk pregnancy, unspecified, unspecified trimester: Secondary | ICD-10-CM

## 2017-04-26 DIAGNOSIS — Z3A33 33 weeks gestation of pregnancy: Secondary | ICD-10-CM

## 2017-04-26 DIAGNOSIS — O9989 Other specified diseases and conditions complicating pregnancy, childbirth and the puerperium: Secondary | ICD-10-CM | POA: Diagnosis present

## 2017-04-26 DIAGNOSIS — O30043 Twin pregnancy, dichorionic/diamniotic, third trimester: Secondary | ICD-10-CM | POA: Diagnosis not present

## 2017-04-26 DIAGNOSIS — O99013 Anemia complicating pregnancy, third trimester: Secondary | ICD-10-CM | POA: Diagnosis present

## 2017-04-26 DIAGNOSIS — Z87891 Personal history of nicotine dependence: Secondary | ICD-10-CM

## 2017-04-26 DIAGNOSIS — O1493 Unspecified pre-eclampsia, third trimester: Secondary | ICD-10-CM

## 2017-04-26 DIAGNOSIS — O133 Gestational [pregnancy-induced] hypertension without significant proteinuria, third trimester: Secondary | ICD-10-CM

## 2017-04-26 DIAGNOSIS — O99019 Anemia complicating pregnancy, unspecified trimester: Secondary | ICD-10-CM | POA: Diagnosis present

## 2017-04-26 DIAGNOSIS — O30049 Twin pregnancy, dichorionic/diamniotic, unspecified trimester: Secondary | ICD-10-CM

## 2017-04-26 DIAGNOSIS — R8271 Bacteriuria: Secondary | ICD-10-CM

## 2017-04-26 DIAGNOSIS — O10013 Pre-existing essential hypertension complicating pregnancy, third trimester: Secondary | ICD-10-CM | POA: Diagnosis present

## 2017-04-26 DIAGNOSIS — H60392 Other infective otitis externa, left ear: Secondary | ICD-10-CM

## 2017-04-26 DIAGNOSIS — H60502 Unspecified acute noninfective otitis externa, left ear: Secondary | ICD-10-CM

## 2017-04-26 DIAGNOSIS — H6092 Unspecified otitis externa, left ear: Secondary | ICD-10-CM | POA: Diagnosis present

## 2017-04-26 LAB — COMPREHENSIVE METABOLIC PANEL
ALBUMIN: 2.5 g/dL — AB (ref 3.5–5.0)
ALT: 12 U/L — AB (ref 14–54)
AST: 15 U/L (ref 15–41)
Alkaline Phosphatase: 205 U/L — ABNORMAL HIGH (ref 38–126)
Anion gap: 9 (ref 5–15)
BUN: 7 mg/dL (ref 6–20)
CHLORIDE: 108 mmol/L (ref 101–111)
CO2: 21 mmol/L — AB (ref 22–32)
Calcium: 8.8 mg/dL — ABNORMAL LOW (ref 8.9–10.3)
Creatinine, Ser: 0.72 mg/dL (ref 0.44–1.00)
GFR calc Af Amer: >60 mL/min (ref >60)
Glucose, Bld: 82 mg/dL (ref 65–99)
POTASSIUM: 4.2 mmol/L (ref 3.5–5.1)
SODIUM: 138 mmol/L (ref 135–145)
Total Bilirubin: 0.4 mg/dL (ref 0.3–1.2)
Total Protein: 5.8 g/dL — ABNORMAL LOW (ref 6.5–8.1)

## 2017-04-26 LAB — CBC WITH DIFFERENTIAL/PLATELET
BASOS ABS: 0 10*3/uL (ref 0.0–0.1)
BASOS PCT: 0 %
EOS ABS: 0.1 10*3/uL (ref 0.0–0.7)
EOS PCT: 1 %
HCT: 30.5 % — ABNORMAL LOW (ref 36.0–46.0)
Hemoglobin: 9.7 g/dL — ABNORMAL LOW (ref 12.0–15.0)
Lymphocytes Relative: 22 %
Lymphs Abs: 1.9 10*3/uL (ref 0.7–4.0)
MCH: 26.8 pg (ref 26.0–34.0)
MCHC: 31.8 g/dL (ref 30.0–36.0)
MCV: 84.3 fL (ref 78.0–100.0)
MONO ABS: 0.6 10*3/uL (ref 0.1–1.0)
Monocytes Relative: 7 %
Neutro Abs: 6.1 10*3/uL (ref 1.7–7.7)
Neutrophils Relative %: 70 %
PLATELETS: 186 10*3/uL (ref 150–400)
RBC: 3.62 MIL/uL — AB (ref 3.87–5.11)
RDW: 14.3 % (ref 11.5–15.5)
WBC: 8.6 10*3/uL (ref 4.0–10.5)

## 2017-04-26 LAB — URINALYSIS, ROUTINE W REFLEX MICROSCOPIC
Bilirubin Urine: NEGATIVE
GLUCOSE, UA: NEGATIVE mg/dL
HGB URINE DIPSTICK: NEGATIVE
KETONES UR: NEGATIVE mg/dL
Nitrite: NEGATIVE
PROTEIN: 100 mg/dL — AB
Specific Gravity, Urine: 1.018 (ref 1.005–1.030)
pH: 6 (ref 5.0–8.0)

## 2017-04-26 LAB — PROTEIN / CREATININE RATIO, URINE
CREATININE, URINE: 149.4 mg/dL
PROTEIN CREATININE RATIO: 0.69 mg/mg{creat} — AB (ref 0.00–0.15)
Total Protein, Urine: 103 mg/dL

## 2017-04-26 LAB — TYPE AND SCREEN
ABO/RH(D): AB POS
ANTIBODY SCREEN: NEGATIVE

## 2017-04-26 MED ORDER — BETAMETHASONE SOD PHOS & ACET 6 (3-3) MG/ML IJ SUSP
12.0000 mg | INTRAMUSCULAR | Status: AC
  Administered 2017-04-26 – 2017-04-27 (×2): 12 mg via INTRAMUSCULAR
  Filled 2017-04-26 (×2): qty 2

## 2017-04-26 MED ORDER — DOCUSATE SODIUM 100 MG PO CAPS
100.0000 mg | ORAL_CAPSULE | Freq: Every day | ORAL | Status: DC
  Administered 2017-04-30: 100 mg via ORAL
  Filled 2017-04-26 (×5): qty 1

## 2017-04-26 MED ORDER — AMOXICILLIN-POT CLAVULANATE 875-125 MG PO TABS
1.0000 | ORAL_TABLET | Freq: Two times a day (BID) | ORAL | Status: DC
Start: 2017-04-26 — End: 2017-04-30
  Administered 2017-04-27 – 2017-04-30 (×8): 1 via ORAL
  Filled 2017-04-26 (×8): qty 1

## 2017-04-26 MED ORDER — SODIUM CHLORIDE 0.9% FLUSH
3.0000 mL | Freq: Two times a day (BID) | INTRAVENOUS | Status: DC
  Administered 2017-04-27 – 2017-04-29 (×5): 3 mL via INTRAVENOUS

## 2017-04-26 MED ORDER — ZOLPIDEM TARTRATE 5 MG PO TABS: 5.0000 mg | ORAL_TABLET | Freq: Every evening | ORAL | Status: DC | PRN

## 2017-04-26 MED ORDER — SODIUM CHLORIDE 0.9 % IV SOLN: 250.0000 mL | INTRAVENOUS | Status: DC | PRN

## 2017-04-26 MED ORDER — ACETAMINOPHEN 325 MG PO TABS
650.0000 mg | ORAL_TABLET | ORAL | Status: DC | PRN
  Administered 2017-04-27: 650 mg via ORAL
  Filled 2017-04-26: qty 2

## 2017-04-26 MED ORDER — LACTATED RINGERS IV SOLN
INTRAVENOUS | Status: DC
  Administered 2017-04-26: 22:00:00 via INTRAVENOUS

## 2017-04-26 MED ORDER — SODIUM CHLORIDE 0.9% FLUSH: 3.0000 mL | INTRAVENOUS | Status: DC | PRN

## 2017-04-26 MED ORDER — PRENATAL MULTIVITAMIN CH
1.0000 | ORAL_TABLET | Freq: Every day | ORAL | Status: DC
  Filled 2017-04-26 (×2): qty 1

## 2017-04-26 MED ORDER — ACETAMINOPHEN 325 MG PO TABS
650.0000 mg | ORAL_TABLET | Freq: Once | ORAL | Status: AC
  Administered 2017-04-26: 650 mg via ORAL
  Filled 2017-04-26: qty 2

## 2017-04-26 MED ORDER — OFLOXACIN 0.3 % OP SOLN: 5.0000 [drp] | Freq: Every day | OPHTHALMIC | Status: DC

## 2017-04-26 MED ORDER — CALCIUM CARBONATE ANTACID 500 MG PO CHEW: 2.0000 | CHEWABLE_TABLET | ORAL | Status: DC | PRN

## 2017-04-26 NOTE — ED Notes (Signed)
Charge RN made aware pt needs room as soon as possible.

## 2017-04-26 NOTE — ED Notes (Signed)
[facility] arrived  

## 2017-04-26 NOTE — Progress Notes (Signed)
Arrived to see pataient, patient currently back in waiting area, awaiting a room.

## 2017-04-26 NOTE — ED Triage Notes (Addendum)
Pt is 4873w2d pregnant, states she has had high bp with pregnancy, has not been diagnosed with preeclampsia but started noticing swelling in hands and lower extremities for the past few days. Pt reports what feels like a right sided sinus headache. Also reports lower back pain, denies discharge or bleeding, no contractions. resp e/u, nad.

## 2017-04-26 NOTE — ED Notes (Signed)
PA Caccavale made aware of patient's complaint, verbal orders given to this RN.

## 2017-04-26 NOTE — Progress Notes (Signed)
Call to Dr Hillery HunterArmold, updated on patient staus and EFM tracing and current BP 135/82.  Updeated on edema of legs and 1 beat clonus.  Patient to be transferred to MAU for additional f/u and monitoring. ED provider in to see patient and agrees with plan to transfer to MAU.

## 2017-04-26 NOTE — Discharge Instructions (Signed)
[facility]  Dr. Debroah LoopArnold accepting

## 2017-04-26 NOTE — Progress Notes (Signed)
Call[facility], patient [id number], G1P0, with c/o swelling in legs and feet, Left sided headache, and crampy in lower back.  [facility], and decided to come to ER.  Patient denies pain under R breast, denies blurry vision.  Patient states she has been told in office that her BP was elevated. FHT monitor applied, assessing.

## 2017-04-26 NOTE — ED Notes (Signed)
Rapid OB at bedside 

## 2017-04-26 NOTE — H&P (Signed)
ANTEPARTUM ADMISSION HISTORY AND PHYSICAL NOTE   History of Present Illness: Jackie Chavez is a 21 y.o. G1P0 at 2334w1d admitted for twins gestation with chronic hypertension with superimposed preeclampsia.  And left ear pain and left headache Patient reports the fetal movement as active. Patient reports uterine contraction  activity as none. Patient reports  vaginal bleeding as none. Patient describes fluid per vagina as None. Fetal presentation is unsure.  Patient Active Problem List   Diagnosis Date Noted  . Twin gestation in third trimester 04/26/2017  . Chronic hypertension with superimposed preeclampsia 04/26/2017  . Otitis externa, left 04/26/2017  . GBS bacteriuria 04/21/2017  . Gestational hypertension 04/18/2017  . Dichorionic diamniotic twin pregnancy, antepartum 04/01/2017  . Transient hypertension of pregnancy 01/28/2017  . Rubella non-immune status, antepartum 01/07/2017  . Supervision of high risk pregnancy, antepartum 12/18/2016    Past Medical History:  Diagnosis Date  . Anxiety   . BV (bacterial vaginosis)   . Headache    migraines  . Hx of chlamydia infection 10/17/2016    Past Surgical History:  Procedure Laterality Date  . NO PAST SURGERIES      OB History  Gravida Para Term Preterm AB Living  1            SAB TAB Ectopic Multiple Live Births               # Outcome Date GA Lbr Len/2nd Weight Sex Delivery Anes PTL Lv  1 Current               Social History   Socioeconomic History  . Marital status: Single    Spouse name: None  . Number of children: None  . Years of education: None  . Highest education level: None  Social Needs  . Financial resource strain: None  . Food insecurity - worry: None  . Food insecurity - inability: None  . Transportation needs - medical: None  . Transportation needs - non-medical: None  Occupational History  . None  Tobacco Use  . Smoking status: Former Games developermoker  . Smokeless tobacco: Never Used   Substance and Sexual Activity  . Alcohol use: No  . Drug use: No  . Sexual activity: Yes    Comment: currently pregnant  Other Topics Concern  . None  Social History Narrative  . None    Family History  Problem Relation Age of Onset  . COPD Mother   . Diabetes Mother   . Mental illness Mother   . Alcohol abuse Mother   . Drug abuse Mother   . Diabetes Father   . Hypertension Father   . Alcohol abuse Father   . Drug abuse Father     No Known Allergies  Medications Prior to Admission  Medication Sig Dispense Refill Last Dose  . azithromycin (ZITHROMAX) 250 MG tablet Take as instructed (Patient not taking: Reported on 04/08/2017) 6 each 0 Not Taking at Unknown time  . Elastic Bandages & Supports (COMFORT FIT MATERNITY SUPP MED) MISC Wear daily when ambulating (Patient not taking: Reported on 04/08/2017) 1 each 0 Not Taking  . metroNIDAZOLE (FLAGYL) 500 MG tablet Take 1 tablet (500 mg total) 2 (two) times daily by mouth. 14 tablet 0 04/18/2017 at Unknown time  . Prenatal Vit-Fe Phos-FA-Omega (VITAFOL GUMMIES) 3.33-0.333-34.8 MG CHEW Chew 1 tablet by mouth daily. (Patient not taking: Reported on 04/18/2017) 90 tablet 3 Not Taking at Unknown time    Review of Systems - ENT ROS: positive  for - headaches and and left ear pain pain  Vitals:  BP (!) 145/92   Pulse 90   Temp 97.7 F (36.5 C) (Oral)   Resp 17 Comment: Simultaneous filing. User may not have seen previous data.  Ht 5\' 1"  (1.549 m)   Wt 229 lb (103.9 kg)   LMP 09/06/2016   SpO2 100%   BMI 43.27 kg/m  Physical Examination: CONSTITUTIONAL: Well-developed, well-nourished female in no acute distress.  HENT:  Normocephalic, atraumatic, External left ear tender and canal and TM redl. Oropharynx is clear and moist EYES: Conjunctivae and EOM are normal. Pupils are equal, round. No scleral icterus.  NECK: Normal range of motion, supple, no masses SKIN: Skin is warm and dry. No rash noted. Not diaphoretic. No erythema.  No pallor. NEUROLGIC: Alert and oriented to person, place, and time. Normal reflexes, muscle tone coordination. No cranial nerve deficit noted. PSYCHIATRIC: Normal mood and affect. Normal behavior. Normal judgment and thought content. CARDIOVASCULAR: Normal heart rate noted, regular rhythm RESPIRATORY: Effort normal, no problems with respiration noted ABDOMEN: Soft, nontender, nondistended, gravid. MUSCULOSKELETAL: Normal range of motion. 1+ edema and no tenderness.   Cervix: Not evaluated. . Membranes:intact   Labs:  Results for orders placed or performed during the hospital encounter of 04/26/17 (from the past 24 hour(s))  CBC with Differential   Collection Time: 04/26/17  7:28 PM  Result Value Ref Range   WBC 8.6 4.0 - 10.5 K/uL   RBC 3.62 (L) 3.87 - 5.11 MIL/uL   Hemoglobin 9.7 (L) 12.0 - 15.0 g/dL   HCT 74.2 (L) 59.5 - 63.8 %   MCV 84.3 78.0 - 100.0 fL   MCH 26.8 26.0 - 34.0 pg   MCHC 31.8 30.0 - 36.0 g/dL   RDW 75.6 43.3 - 29.5 %   Platelets 186 150 - 400 K/uL   Neutrophils Relative % 70 %   Neutro Abs 6.1 1.7 - 7.7 K/uL   Lymphocytes Relative 22 %   Lymphs Abs 1.9 0.7 - 4.0 K/uL   Monocytes Relative 7 %   Monocytes Absolute 0.6 0.1 - 1.0 K/uL   Eosinophils Relative 1 %   Eosinophils Absolute 0.1 0.0 - 0.7 K/uL   Basophils Relative 0 %   Basophils Absolute 0.0 0.0 - 0.1 K/uL  Comprehensive metabolic panel   Collection Time: 04/26/17  7:28 PM  Result Value Ref Range   Sodium 138 135 - 145 mmol/L   Potassium 4.2 3.5 - 5.1 mmol/L   Chloride 108 101 - 111 mmol/L   CO2 21 (L) 22 - 32 mmol/L   Glucose, Bld 82 65 - 99 mg/dL   BUN 7 6 - 20 mg/dL   Creatinine, Ser 1.88 0.44 - 1.00 mg/dL   Calcium 8.8 (L) 8.9 - 10.3 mg/dL   Total Protein 5.8 (L) 6.5 - 8.1 g/dL   Albumin 2.5 (L) 3.5 - 5.0 g/dL   AST 15 15 - 41 U/L   ALT 12 (L) 14 - 54 U/L   Alkaline Phosphatase 205 (H) 38 - 126 U/L   Total Bilirubin 0.4 0.3 - 1.2 mg/dL   GFR calc non Af Amer >60 >60 mL/min   GFR calc  Af Amer >60 >60 mL/min   Anion gap 9 5 - 15  Urinalysis, Routine w reflex microscopic   Collection Time: 04/26/17  7:54 PM  Result Value Ref Range   Color, Urine YELLOW YELLOW   APPearance CLEAR CLEAR   Specific Gravity, Urine 1.018 1.005 - 1.030  pH 6.0 5.0 - 8.0   Glucose, UA NEGATIVE NEGATIVE mg/dL   Hgb urine dipstick NEGATIVE NEGATIVE   Bilirubin Urine NEGATIVE NEGATIVE   Ketones, ur NEGATIVE NEGATIVE mg/dL   Protein, ur 161 (A) NEGATIVE mg/dL   Nitrite NEGATIVE NEGATIVE   Leukocytes, UA TRACE (A) NEGATIVE   RBC / HPF 0-5 0 - 5 RBC/hpf   WBC, UA 0-5 0 - 5 WBC/hpf   Bacteria, UA RARE (A) NONE SEEN   Squamous Epithelial / LPF 0-5 (A) NONE SEEN   Mucus PRESENT   Protein / creatinine ratio, urine   Collection Time: 04/26/17  8:39 PM  Result Value Ref Range   Creatinine, Urine 149.40 mg/dL   Total Protein, Urine 103 mg/dL   Protein Creatinine Ratio 0.69 (H) 0.00 - 0.15 mg/mg[Cre]    Imaging Studies: [place] Mfm Ob Follow Up  Result Date: 04/12/2017 ----------------------------------------------------------------------  OBSTETRICS REPORT                      (Signed Final 04/12/2017 09:46 am) ---------------------------------------------------------------------- Patient Info  ID #:       [id number]                          D.O.B.:  [birth year] (21 yrs)  Name:       [name]               Visit Date: 04/12/2017 09:06 am ---------------------------------------------------------------------- Performed By  Performed By:     Percell Boston          Ref. Address:     [address]                                                             [place]                                                             [address]  Attending:        Charlsie Merles MD         Location:         [facility]  Referred By:      Roe Coombs CNM  ---------------------------------------------------------------------- Orders   #  Description                                 Code   1  [place] MFM OB  FOLLOW UP                         16109.60   2  [place] MFM OB FOLLOW UP ADDL GEST               45409.81  ----------------------------------------------------------------------   #  Ordered By               Order #        Accession #    Episode #   1  Nettie Elm            [id number]      [id number]     [id number]   2  PEGGY CONSTANT           [id number]      [id number]     [id number]  ---------------------------------------------------------------------- Indications   [redacted] weeks gestation of pregnancy                Z3A.31   Encounter for other antenatal screening        Z36.2   follow-up   Evaluate anatomy not seen on prior             Z04.8   sonogram   Twin pregnancy, di/di, third trimester         O30.043  ---------------------------------------------------------------------- OB History  Gravidity:    1         Term:   0        Prem:   0        SAB:   0  TOP:          0       Ectopic:  0        Living: 0 ---------------------------------------------------------------------- Fetal Evaluation (Fetus A)  Num Of Fetuses:     2  Fetal Heart         138  Rate(bpm):  Cardiac Activity:   Observed  Fetal Lie:          Maternal left side  Presentation:       Cephalic  Placenta:           Anterior, above cervical os  P. Cord Insertion:  Visualized, central  Membrane Desc:      Dividing Membrane seen  Amniotic Fluid  AFI FV:      Subjectively within normal limits                              Largest Pocket(cm)                              3.5 ---------------------------------------------------------------------- Biometry (Fetus A)  BPD:      79.3  mm     G. Age:  31w 6d         61  %    CI:         76.9   %    70 - 86                                                          FL/HC:      19.7   %  19.3 - 21.3  HC:      286.4  mm     G. Age:  31w 3d         23  %    HC/AC:      1.00         0.96 - 1.17  AC:       285   mm     G. Age:  32w 4d         83  %    FL/BPD:     71.0   %    71 - 87  FL:       56.3  mm     G. Age:  29w 4d          7  %    FL/AC:      19.8   %    20 - 24  HUM:      51.6  mm     G. Age:  30w 1d         31  %  Est. FW:    1776  gm    3 lb 15 oz      62  %     FW Discordancy     0 \ 17 % ---------------------------------------------------------------------- Gestational Age (Fetus A)  LMP:           31w 1d        Date:  09/06/16                 EDD:   06/13/17  U/S Today:     31w 3d                                        EDD:   06/11/17  Best:          31w 1d     Det. By:  LMP  (09/06/16)          EDD:   06/13/17 ---------------------------------------------------------------------- Anatomy (Fetus A)  Cranium:               Appears normal         Aortic Arch:            Previously seen  Cavum:                 Previously seen        Ductal Arch:            Not well visualized  Ventricles:            Previously seen        Diaphragm:              Previously seen  Choroid Plexus:        Previously seen        Stomach:                Appears normal, left                                                                        sided  Cerebellum:  Previously seen        Abdomen:                Previously seen  Posterior Fossa:       Previously seen        Abdominal Wall:         Previously seen  Nuchal Fold:           Not applicable (>20    Cord Vessels:           Previously seen                         wks GA)  Face:                  Orbits and profile     Kidneys:                Appear normal                         previously seen  Lips:                  Previously seen        Bladder:                Appears normal  Thoracic:              Appears normal         Spine:                  Previously seen  Heart:                 Previously seen        Upper Extremities:      Previously seen  RVOT:                  Appears normal         Lower Extremities:      Previously seen   LVOT:                  Appears normal  Other:  Fetus appears to be a female. Heels previously visualized. Nasal          bone previously visualized. Technically difficult due to fetal position. ---------------------------------------------------------------------- Fetal Evaluation (Fetus B)  Num Of Fetuses:     2  Preg. Location:     .  Fetal Heart         132  Rate(bpm):  Cardiac Activity:   Observed  Fetal Lie:          Maternal right side  Presentation:       Breech  Placenta:           Posterior, above cervical os  P. Cord Insertion:  Visualized, central  Membrane Desc:      Dividing Membrane seen  Amniotic Fluid  AFI FV:      Subjectively within normal limits                              Largest Pocket(cm)                              6 ---------------------------------------------------------------------- Biometry (Fetus B)  BPD:      77.9  mm     G. Age:  31w 2d         43  %    CI:         78.4   %    70 - 86                                                          FL/HC:      19.5   %    19.3 - 21.3  HC:      278.3  mm     G. Age:  30w 3d          7  %    HC/AC:      1.06        0.96 - 1.17  AC:      261.8  mm     G. Age:  30w 2d         24  %    FL/BPD:     69.6   %    71 - 87  FL:       54.2  mm     G. Age:  28w 5d        < 3  %    FL/AC:      20.7   %    20 - 24  HUM:      49.9  mm     G. Age:  29w 2d         12  %  Est. FW:    1471  gm      3 lb 4 oz     29  %     FW Discordancy        17  % ---------------------------------------------------------------------- Gestational Age (Fetus B)  LMP:           31w 1d        Date:  09/06/16                 EDD:   06/13/17  U/S Today:     30w 1d                                        EDD:   06/20/17  Best:          31w 1d     Det. By:  LMP  (09/06/16)          EDD:   06/13/17 ---------------------------------------------------------------------- Anatomy (Fetus B)  Cranium:               Appears normal         Aortic Arch:            Previously seen  Cavum:                  Previously seen        Ductal Arch:            Not well visualized  Ventricles:            Appears normal         Diaphragm:              Previously seen  Choroid Plexus:  Previously seen        Stomach:                Appears normal, left                                                                        sided  Cerebellum:            Previously seen        Abdomen:                Previously seen  Posterior Fossa:       Previously seen        Abdominal Wall:         Previously seen  Nuchal Fold:           Not applicable (>20    Cord Vessels:           Previously seen                         wks GA)  Face:                  Orbits and profile     Kidneys:                Appear normal                         previously seen  Lips:                  Previously seen        Bladder:                Appears normal  Thoracic:              Appears normal         Spine:                  Appears normal  Heart:                 Not well visualized    Upper Extremities:      Previously seen  RVOT:                  3vv appears normal     Lower Extremities:      Previously seen  LVOT:                  Appears normal  Other:  Fetus appears to be a female. Nasal bone previously visualized.          Technically difficult due to fetal position. ---------------------------------------------------------------------- Cervix Uterus Adnexa  Cervix  Not visualized (advanced GA >29wks)  Uterus  No abnormality visualized.  Left Ovary  No adnexal mass visualized.  Right Ovary  No adnexal mass visualized.  Cul De Sac:   No free fluid seen.  Adnexa:       No abnormality visualized. ---------------------------------------------------------------------- Impression  Diamniotic Dichorionic intrauterine pregnancy at 31+1 weeks  with  Presentation is cephalic/breech  Normal amniotic fluid in each sac, with  MVP of 3.5 cm for A  and 6.0 cm for B  Interval review of anatomy is normal for both twins  Normal cardiac activity and movement  in both twins  Growth is normal for both twins with 62nd percentile for A and  29th percentile for B, 17% discordance ---------------------------------------------------------------------- Recommendations  Recommend follow-up ultrasound examination in 4 weeks for  final evaluation of growth ----------------------------------------------------------------------                 Charlsie Merles, MD Electronically Signed Final Report   04/12/2017 09:46 am ----------------------------------------------------------------------  [place] Mfm Ob Follow Up Addl Gest  Result Date: 04/12/2017 ----------------------------------------------------------------------  OBSTETRICS REPORT                      (Signed Final 04/12/2017 09:46 am) ---------------------------------------------------------------------- Patient Info  ID #:       [id number]                          D.O.B.:  11-14-1995 (21 yrs)  Name:       [name]               Visit Date: 04/12/2017 09:06 am ---------------------------------------------------------------------- Performed By  Performed By:     Percell Boston          Ref. Address:     [address]                                                             [place]                                                             [address]  Attending:        Charlsie Merles MD         Location:         [facility]  Referred By:      Roe Coombs CNM ---------------------------------------------------------------------- Orders   #  Description                                 Code   1  [place] MFM OB FOLLOW UP                         [id number]   2  [place] MFM OB FOLLOW UP ADDL GEST               19147.82  ----------------------------------------------------------------------   #  Ordered By               Order #        Accession #    Episode #   1  Nettie Elm            [id number]      [id number]      [id number]   2  PEGGY CONSTANT           [id number]      [id number]     [id number]  ---------------------------------------------------------------------- Indications   [redacted] weeks gestation of pregnancy                Z3A.31   Encounter for other antenatal screening        Z36.2   follow-up   Evaluate anatomy not seen on prior             Z04.8   sonogram   Twin pregnancy, di/di, third trimester         O30.043  ---------------------------------------------------------------------- OB History  Gravidity:    1         Term:   0        Prem:   0        SAB:   0  TOP:          0       Ectopic:  0        Living: 0 ---------------------------------------------------------------------- Fetal Evaluation (Fetus A)  Num Of Fetuses:     2  Fetal Heart         138  Rate(bpm):  Cardiac Activity:   Observed  Fetal Lie:          Maternal left side  Presentation:       Cephalic  Placenta:           Anterior, above cervical os  P. Cord Insertion:  Visualized, central  Membrane Desc:      Dividing Membrane seen  Amniotic Fluid  AFI FV:      Subjectively within normal limits                              Largest Pocket(cm)                              3.5 ---------------------------------------------------------------------- Biometry (Fetus A)  BPD:      79.3  mm     G. Age:  31w 6d         61  %    CI:         76.9   %    70 - 86                                                          FL/HC:      19.7   %    19.3 - 21.3  HC:      286.4  mm     G. Age:  31w 3d         23  %    HC/AC:      1.00        0.96 - 1.17  AC:  285   mm     G. Age:  32w 4d         83  %    FL/BPD:     71.0   %    71 - 87  FL:       56.3  mm     G. Age:  29w 4d          7  %    FL/AC:      19.8   %    20 - 24  HUM:      51.6  mm     G. Age:  30w 1d         31  %  Est. FW:    1776  gm    3 lb 15 oz      62  %     FW Discordancy     0 \ 17 % ---------------------------------------------------------------------- Gestational Age (Fetus A)  LMP:           31w 1d         Date:  09/06/16                 EDD:   06/13/17  U/S Today:     31w 3d                                        EDD:   06/11/17  Best:          31w 1d     Det. By:  LMP  (09/06/16)          EDD:   06/13/17 ---------------------------------------------------------------------- Anatomy (Fetus A)  Cranium:               Appears normal         Aortic Arch:            Previously seen  Cavum:                 Previously seen        Ductal Arch:            Not well visualized  Ventricles:            Previously seen        Diaphragm:              Previously seen  Choroid Plexus:        Previously seen        Stomach:                Appears normal, left                                                                        sided  Cerebellum:            Previously seen        Abdomen:                Previously seen  Posterior Fossa:       Previously seen        Abdominal Wall:  Previously seen  Nuchal Fold:           Not applicable (>20    Cord Vessels:           Previously seen                         wks GA)  Face:                  Orbits and profile     Kidneys:                Appear normal                         previously seen  Lips:                  Previously seen        Bladder:                Appears normal  Thoracic:              Appears normal         Spine:                  Previously seen  Heart:                 Previously seen        Upper Extremities:      Previously seen  RVOT:                  Appears normal         Lower Extremities:      Previously seen  LVOT:                  Appears normal  Other:  Fetus appears to be a female. Heels previously visualized. Nasal          bone previously visualized. Technically difficult due to fetal position. ---------------------------------------------------------------------- Fetal Evaluation (Fetus B)  Num Of Fetuses:     2  Preg. Location:     .  Fetal Heart         132  Rate(bpm):  Cardiac Activity:   Observed  Fetal Lie:          Maternal right side   Presentation:       Breech  Placenta:           Posterior, above cervical os  P. Cord Insertion:  Visualized, central  Membrane Desc:      Dividing Membrane seen  Amniotic Fluid  AFI FV:      Subjectively within normal limits                              Largest Pocket(cm)                              6 ---------------------------------------------------------------------- Biometry (Fetus B)  BPD:      77.9  mm     G. Age:  31w 2d         43  %    CI:         78.4   %    70 - 86  FL/HC:      19.5   %    19.3 - 21.3  HC:      278.3  mm     G. Age:  30w 3d          7  %    HC/AC:      1.06        0.96 - 1.17  AC:      261.8  mm     G. Age:  30w 2d         24  %    FL/BPD:     69.6   %    71 - 87  FL:       54.2  mm     G. Age:  28w 5d        < 3  %    FL/AC:      20.7   %    20 - 24  HUM:      49.9  mm     G. Age:  29w 2d         12  %  Est. FW:    1471  gm      3 lb 4 oz     29  %     FW Discordancy        17  % ---------------------------------------------------------------------- Gestational Age (Fetus B)  LMP:           31w 1d        Date:  09/06/16                 EDD:   06/13/17  U/S Today:     30w 1d                                        EDD:   06/20/17  Best:          31w 1d     Det. By:  LMP  (09/06/16)          EDD:   06/13/17 ---------------------------------------------------------------------- Anatomy (Fetus B)  Cranium:               Appears normal         Aortic Arch:            Previously seen  Cavum:                 Previously seen        Ductal Arch:            Not well visualized  Ventricles:            Appears normal         Diaphragm:              Previously seen  Choroid Plexus:        Previously seen        Stomach:                Appears normal, left  sided  Cerebellum:            Previously seen        Abdomen:                Previously seen  Posterior Fossa:        Previously seen        Abdominal Wall:         Previously seen  Nuchal Fold:           Not applicable (>20    Cord Vessels:           Previously seen                         wks GA)  Face:                  Orbits and profile     Kidneys:                Appear normal                         previously seen  Lips:                  Previously seen        Bladder:                Appears normal  Thoracic:              Appears normal         Spine:                  Appears normal  Heart:                 Not well visualized    Upper Extremities:      Previously seen  RVOT:                  3vv appears normal     Lower Extremities:      Previously seen  LVOT:                  Appears normal  Other:  Fetus appears to be a female. Nasal bone previously visualized.          Technically difficult due to fetal position. ---------------------------------------------------------------------- Cervix Uterus Adnexa  Cervix  Not visualized (advanced GA >29wks)  Uterus  No abnormality visualized.  Left Ovary  No adnexal mass visualized.  Right Ovary  No adnexal mass visualized.  Cul De Sac:   No free fluid seen.  Adnexa:       No abnormality visualized. ---------------------------------------------------------------------- Impression  Diamniotic Dichorionic intrauterine pregnancy at 31+1 weeks  with  Presentation is cephalic/breech  Normal amniotic fluid in each sac, with  MVP of 3.5 cm for A  and 6.0 cm for B  Interval review of anatomy is normal for both twins  Normal cardiac activity and movement in both twins  Growth is normal for both twins with 62nd percentile for A and  29th percentile for B, 17% discordance ---------------------------------------------------------------------- Recommendations  Recommend follow-up ultrasound examination in 4 weeks for  final evaluation of growth ----------------------------------------------------------------------                 Charlsie Merles, MD Electronically Signed Final Report   04/12/2017  09:46 am ----------------------------------------------------------------------    Assessment and Plan: Patient Active Problem List   Diagnosis  Date Noted  . Twin gestation in third trimester 04/26/2017  . Chronic hypertension with superimposed preeclampsia 04/26/2017  . Otitis externa, left 04/26/2017  . GBS bacteriuria 04/21/2017  . Gestational hypertension 04/18/2017  . Dichorionic diamniotic twin pregnancy, antepartum 04/01/2017  . Transient hypertension of pregnancy 01/28/2017  . Rubella non-immune status, antepartum 01/07/2017  . Supervision of high risk pregnancy, antepartum 12/18/2016   Admit to Antenatal Betamethasone 12 mg Q 24 hr 2 doses Observe for s/sx of severe preeclampsia NICU consult, Dr Eric Form aware of admission Augmentin and ofloxacin drops for left ear infection  Scheryl Darter, MD Attending physician Faculty Practice, [facility]

## 2017-04-26 NOTE — ED Notes (Signed)
Erin with rapid OB made aware of patient, advised she would send night shift rapid [facility] RN to to see patient.

## 2017-04-26 NOTE — ED Provider Notes (Signed)
[facility] Provider Note   CSN: [id number] Arrival date & time: 04/26/17  1840     History   Chief Complaint Chief Complaint  Patient presents with  . Hypertension    (htn in pregnancy)    HPI Jackie Chavez is a 21 y.o. female.  Patient is [redacted] weeks pregnant with twins.  She started having a headache today and [facility] and [facility] was [facility].   The history is provided by the patient. No language interpreter was used.  Headache   This is a new problem. The current episode started 6 to 12 hours ago. The problem occurs constantly. The problem has not changed since onset.The pain is located in the left unilateral region.    Past Medical History:  Diagnosis Date  . Anxiety   . BV (bacterial vaginosis)   . Headache    migraines  . Hx of chlamydia infection 10/17/2016    Patient Active Problem List   Diagnosis Date Noted  . GBS bacteriuria 04/21/2017  . Gestational hypertension 04/18/2017  . Dichorionic diamniotic twin pregnancy, antepartum 04/01/2017  . Vaginal discharge during pregnancy 03/04/2017  . Transient hypertension of pregnancy 01/28/2017  . Rubella non-immune status, antepartum 01/07/2017  . Supervision of high risk pregnancy, antepartum 12/18/2016  . Twin gestation in second trimester 10/21/2016    Past Surgical History:  Procedure Laterality Date  . NO PAST SURGERIES      OB History    Gravida Para Term Preterm AB Living   1             SAB TAB Ectopic Multiple Live Births                   Home Medications    Prior to Admission medications   Medication Sig Start Date End Date Taking? Authorizing Provider  azithromycin (ZITHROMAX) 250 MG tablet Take as instructed Patient not taking: Reported on 04/08/2017 04/01/17   Constant, Peggy, MD  Elastic Bandages & Supports (COMFORT FIT MATERNITY SUPP MED) MISC Wear daily when ambulating Patient not taking: Reported on 04/08/2017  04/01/17   Constant, Peggy, MD  metroNIDAZOLE (FLAGYL) 500 MG tablet Take 1 tablet (500 mg total) 2 (two) times daily by mouth. 04/01/17   Hermina StaggersErvin, Michael L, MD  Prenatal Vit-Fe Phos-FA-Omega (VITAFOL GUMMIES) 3.33-0.333-34.8 MG CHEW Chew 1 tablet by mouth daily. Patient not taking: Reported on 04/18/2017 02/12/17   Hermina StaggersErvin, Michael L, MD    Family History Family History  Problem Relation Age of Onset  . COPD Mother   . Diabetes Mother   . Mental illness Mother   . Alcohol abuse Mother   . Drug abuse Mother   . Diabetes Father   . Hypertension Father   . Alcohol abuse Father   . Drug abuse Father     Social History Social History   Tobacco Use  . Smoking status: Former Games developermoker  . Smokeless tobacco: Never Used  Substance Use Topics  . Alcohol use: No  . Drug use: No     Allergies   Patient has no known allergies.   Review of Systems Review of Systems  Constitutional: Negative for appetite change and fatigue.  HENT: Negative for congestion, ear discharge and sinus pressure.   Eyes: Negative for discharge.  Respiratory: Negative for cough.   Cardiovascular: Negative for chest pain.  Gastrointestinal: Negative for abdominal pain and diarrhea.  Genitourinary: Negative for frequency and hematuria.  Musculoskeletal: Negative  for back pain.  Skin: Negative for rash.  Neurological: Positive for headaches. Negative for seizures.  Psychiatric/Behavioral: Negative for hallucinations.     Physical Exam Updated Vital Signs BP (!) 141/86   Pulse 87   Temp 98.7 F (37.1 C) (Oral)   Resp (!) 22   Ht 5\' 1"  (1.549 m)   Wt 103.9 kg (229 lb)   LMP 09/06/2016   SpO2 99%   BMI 43.27 kg/m   Physical Exam  Constitutional: She is oriented to person, place, and time. She appears well-developed.  HENT:  Head: Normocephalic.  Fundi sharp  Eyes: Conjunctivae and EOM are normal. No scleral icterus.  Ear exam normal  Neck: Neck supple. No thyromegaly present.  Cardiovascular:  Normal rate and regular rhythm. Exam reveals no gallop and no friction rub.  No murmur heard. Pulmonary/Chest: No stridor. She has no wheezes. She has no rales. She exhibits no tenderness.  Abdominal: She exhibits no distension. There is no tenderness. There is no rebound.  Musculoskeletal: Normal range of motion. She exhibits no edema.  Lymphadenopathy:    She has no cervical adenopathy.  Neurological: She is oriented to person, place, and time. She exhibits normal muscle tone. Coordination normal.  Skin: No rash noted. No erythema.  Psychiatric: She has a normal mood and affect. Her behavior is normal.     ED Treatments / Results  Labs (all labs ordered are listed, but only abnormal results are displayed) Labs Reviewed  CBC WITH DIFFERENTIAL/PLATELET - Abnormal; Notable for the following components:      Result Value   RBC 3.62 (*)    Hemoglobin 9.7 (*)    HCT 30.5 (*)    All other components within normal limits  COMPREHENSIVE METABOLIC PANEL - Abnormal; Notable for the following components:   CO2 21 (*)    Calcium 8.8 (*)    Total Protein 5.8 (*)    Albumin 2.5 (*)    ALT 12 (*)    Alkaline Phosphatase 205 (*)    All other components within normal limits  URINALYSIS, ROUTINE W REFLEX MICROSCOPIC - Abnormal; Notable for the following components:   Protein, ur 100 (*)    Leukocytes, UA TRACE (*)    Bacteria, UA RARE (*)    Squamous Epithelial / LPF 0-5 (*)    All other components within normal limits  PROTEIN / CREATININE RATIO, URINE    EKG  EKG Interpretation None       Radiology No results found.  Procedures Procedures (including critical care time)  Medications Ordered in ED Medications  acetaminophen (TYLENOL) tablet 650 mg (not administered)     Initial Impression / Assessment and Plan / ED Course  I have reviewed the triage vital signs and the nursing notes.  Pertinent labs & imaging results that were available during my care of the patient were  reviewed by me and considered in my medical decision making (see chart for details).     Patient with elevated blood pressure headache and [redacted] weeks pregnant.  Possible preeclampsia.  The OB/GYN physician was called and he felt like the patient needed [facility].  Patient's blood pressure has improved so she will not be getting any blood pressure medicine or magnesium right now  Final Clinical Impressions(s) / ED Diagnoses   Final diagnoses:  Pre-eclampsia in third trimester    ED Discharge Orders    None       Bethann BerkshireZammit, Farhad Burleson, MD 04/26/17 2048

## 2017-04-26 NOTE — Progress Notes (Signed)
[facility] I for transport to [facility]U.  [facility] called to Alvino ChapelJo, RN in MAU.

## 2017-04-27 DIAGNOSIS — O10013 Pre-existing essential hypertension complicating pregnancy, third trimester: Secondary | ICD-10-CM | POA: Diagnosis present

## 2017-04-27 DIAGNOSIS — Z23 Encounter for immunization: Secondary | ICD-10-CM | POA: Diagnosis not present

## 2017-04-27 DIAGNOSIS — O99013 Anemia complicating pregnancy, third trimester: Secondary | ICD-10-CM | POA: Diagnosis present

## 2017-04-27 DIAGNOSIS — O113 Pre-existing hypertension with pre-eclampsia, third trimester: Secondary | ICD-10-CM | POA: Diagnosis present

## 2017-04-27 DIAGNOSIS — H6092 Unspecified otitis externa, left ear: Secondary | ICD-10-CM | POA: Diagnosis present

## 2017-04-27 DIAGNOSIS — O30049 Twin pregnancy, dichorionic/diamniotic, unspecified trimester: Secondary | ICD-10-CM | POA: Diagnosis not present

## 2017-04-27 DIAGNOSIS — O9989 Other specified diseases and conditions complicating pregnancy, childbirth and the puerperium: Secondary | ICD-10-CM | POA: Diagnosis present

## 2017-04-27 DIAGNOSIS — Z87891 Personal history of nicotine dependence: Secondary | ICD-10-CM | POA: Diagnosis not present

## 2017-04-27 DIAGNOSIS — O1493 Unspecified pre-eclampsia, third trimester: Secondary | ICD-10-CM | POA: Diagnosis not present

## 2017-04-27 DIAGNOSIS — O119 Pre-existing hypertension with pre-eclampsia, unspecified trimester: Secondary | ICD-10-CM | POA: Diagnosis not present

## 2017-04-27 DIAGNOSIS — Z3A33 33 weeks gestation of pregnancy: Secondary | ICD-10-CM | POA: Diagnosis not present

## 2017-04-27 DIAGNOSIS — O30043 Twin pregnancy, dichorionic/diamniotic, third trimester: Secondary | ICD-10-CM | POA: Diagnosis present

## 2017-04-27 DIAGNOSIS — D649 Anemia, unspecified: Secondary | ICD-10-CM | POA: Diagnosis present

## 2017-04-27 DIAGNOSIS — R8271 Bacteriuria: Secondary | ICD-10-CM | POA: Diagnosis not present

## 2017-04-27 DIAGNOSIS — H60312 Diffuse otitis externa, left ear: Secondary | ICD-10-CM | POA: Diagnosis not present

## 2017-04-27 MED ORDER — NIFEDIPINE 10 MG PO CAPS
20.0000 mg | ORAL_CAPSULE | Freq: Once | ORAL | Status: AC
  Administered 2017-04-27: 20 mg via ORAL
  Filled 2017-04-27 (×2): qty 2

## 2017-04-27 MED ORDER — COMPLETENATE 29-1 MG PO CHEW
1.0000 | CHEWABLE_TABLET | Freq: Every day | ORAL | Status: DC
  Administered 2017-04-27 – 2017-04-30 (×4): 1 via ORAL
  Filled 2017-04-27 (×5): qty 1

## 2017-04-27 NOTE — Progress Notes (Signed)
[facility] called for consultation. Report given to Dr. Katrinka BlazingSmith.

## 2017-04-27 NOTE — Progress Notes (Signed)
Dr. Emelda FearFerguson at beside for cervical exam.  Cervix closed.  Patient rating contraction 7/10 on pain scale.

## 2017-04-27 NOTE — Progress Notes (Signed)
Dr. Emelda FearFerguson notified of BP 133/93. No new orders, MD to call back.

## 2017-04-27 NOTE — Progress Notes (Signed)
Patient ID: Jackie Chavez, female   DOB: 05/12/1996, 21 y.o.   MRN: [id number] FACULTY PRACTICE ANTEPARTUM(COMPREHENSIVE) NOTE  Jackie Chavez is a 21 y.o. G1P0 at 3285w2d who is admitted for twin pregnancy with preeclampsia.   Fetal presentation is cephalic. Length of Stay:  0  Days  Subjective: Left ear pain and sore throat Patient reports the fetal movement as active. Patient reports uterine contraction  activity as none. Patient reports  vaginal bleeding as none. Patient describes fluid per vagina as None.  Vitals:  Blood pressure 134/78, pulse 80, temperature 98.1 F (36.7 C), temperature source Oral, resp. rate 18, height 5\' 1"  (1.549 m), weight 229 lb (103.9 kg), last menstrual period 09/06/2016, SpO2 99 %. Physical Examination:  General appearance - alert, well appearing, and in no distress Heart - normal rate and regular rhythm Abdomen - soft, nontender, nondistended Fundal Height:  size equals dates Cervical Exam: Not evaluated. Extremities: extremities normal, atraumatic, no cyanosis or edema and Homans sign is negative, no sign of DVT  Membranes:intact  Fetal Monitoring:     Fetal Heart Rate A  Mode External filed at 04/26/2017 2340  Baseline Rate (A) 125 bpm filed at 04/26/2017 2340  Variability 6-25 BPM filed at 04/26/2017 2340  Accelerations 15 x 15 filed at 04/26/2017 2340  Decelerations None filed at 04/26/2017 2340     Labs:  Results for orders placed or performed during the hospital encounter of 04/26/17 (from the past 24 hour(s))  CBC with Differential   Collection Time: 04/26/17  7:28 PM  Result Value Ref Range   WBC 8.6 4.0 - 10.5 K/uL   RBC 3.62 (L) 3.87 - 5.11 MIL/uL   Hemoglobin 9.7 (L) 12.0 - 15.0 g/dL   HCT 91.430.5 (L) 78.236.0 - 95.646.0 %   MCV 84.3 78.0 - 100.0 fL   MCH 26.8 26.0 - 34.0 pg   MCHC 31.8 30.0 - 36.0 g/dL   RDW 21.314.3 08.611.5 - 57.815.5 %   Platelets 186 150 - 400 K/uL   Neutrophils Relative % 70 %   Neutro Abs 6.1 1.7 - 7.7 K/uL   Lymphocytes Relative 22 %   Lymphs Abs 1.9 0.7 - 4.0 K/uL   Monocytes Relative 7 %   Monocytes Absolute 0.6 0.1 - 1.0 K/uL   Eosinophils Relative 1 %   Eosinophils Absolute 0.1 0.0 - 0.7 K/uL   Basophils Relative 0 %   Basophils Absolute 0.0 0.0 - 0.1 K/uL  Comprehensive metabolic panel   Collection Time: 04/26/17  7:28 PM  Result Value Ref Range   Sodium 138 135 - 145 mmol/L   Potassium 4.2 3.5 - 5.1 mmol/L   Chloride 108 101 - 111 mmol/L   CO2 21 (L) 22 - 32 mmol/L   Glucose, Bld 82 65 - 99 mg/dL   BUN 7 6 - 20 mg/dL   Creatinine, Ser 4.690.72 0.44 - 1.00 mg/dL   Calcium 8.8 (L) 8.9 - 10.3 mg/dL   Total Protein 5.8 (L) 6.5 - 8.1 g/dL   Albumin 2.5 (L) 3.5 - 5.0 g/dL   AST 15 15 - 41 U/L   ALT 12 (L) 14 - 54 U/L   Alkaline Phosphatase 205 (H) 38 - 126 U/L   Total Bilirubin 0.4 0.3 - 1.2 mg/dL   GFR calc non Af Amer >60 >60 mL/min   GFR calc Af Amer >60 >60 mL/min   Anion gap 9 5 - 15  Urinalysis, Routine w reflex microscopic   Collection Time: 04/26/17  7:54 PM  Result Value Ref Range   Color, Urine YELLOW YELLOW   APPearance CLEAR CLEAR   Specific Gravity, Urine 1.018 1.005 - 1.030   pH 6.0 5.0 - 8.0   Glucose, UA NEGATIVE NEGATIVE mg/dL   Hgb urine dipstick NEGATIVE NEGATIVE   Bilirubin Urine NEGATIVE NEGATIVE   Ketones, ur NEGATIVE NEGATIVE mg/dL   Protein, ur 960100 (A) NEGATIVE mg/dL   Nitrite NEGATIVE NEGATIVE   Leukocytes, UA TRACE (A) NEGATIVE   RBC / HPF 0-5 0 - 5 RBC/hpf   WBC, UA 0-5 0 - 5 WBC/hpf   Bacteria, UA RARE (A) NONE SEEN   Squamous Epithelial / LPF 0-5 (A) NONE SEEN   Mucus PRESENT   Protein / creatinine ratio, urine   Collection Time: 04/26/17  8:39 PM  Result Value Ref Range   Creatinine, Urine 149.40 mg/dL   Total Protein, Urine 103 mg/dL   Protein Creatinine Ratio 0.69 (H) 0.00 - 0.15 mg/mg[Cre]  Type and screen [facility]   Collection Time: 04/26/17  9:51 PM  Result Value Ref Range   ABO/RH(D) AB POS    Antibody Screen  NEG    Sample Expiration 04/29/2017       Medications:  Scheduled . amoxicillin-clavulanate  1 tablet Oral Q12H  . betamethasone acetate-betamethasone sodium phosphate  12 mg Intramuscular Q24H  . docusate sodium  100 mg Oral Daily  . prenatal multivitamin  1 tablet Oral Q1200  . sodium chloride flush  3 mL Intravenous Q12H   I have reviewed the patient's current medications.  ASSESSMENT: Patient Active Problem List   Diagnosis Date Noted  . Twin gestation in third trimester 04/26/2017  . Chronic hypertension with superimposed preeclampsia 04/26/2017  . Otitis externa, left 04/26/2017  . GBS bacteriuria 04/21/2017  . Gestational hypertension 04/18/2017  . Dichorionic diamniotic twin pregnancy, antepartum 04/01/2017  . Transient hypertension of pregnancy 01/28/2017  . Rubella non-immune status, antepartum 01/07/2017  . Supervision of high risk pregnancy, antepartum 12/18/2016    PLAN: Second dose of betamethasone today Follow BP for elevated values in severe range, most recent are normal  Jackie Chavez 04/27/2017,7:51 AM

## 2017-04-27 NOTE — Progress Notes (Signed)
FACULTY PRACTICE ANTEPARTUM(COMPREHENSIVE) NOTE  Jackie Chavez is a 21 y.o. G1P0 at 6324w2d  who is admitted for twin pregnancy with preeclampsia.  Being treated for ear infection on Augmentin 875.Marland Kitchen.   Fetal presentation is cephalic and transverse.. Length of Stay:  0  Days  Subjective: Pt reports contractions and pain rated 7/10. No increased respirations Patient reports the fetal movement as active. Patient reports uterine contraction  activity as irregular, every 2-6 minutes. Patient reports  vaginal bleeding as none. Patient describes fluid per vagina as None.  Vitals:  Blood pressure 139/75, pulse 89, temperature 98.2 F (36.8 C), temperature source Oral, resp. rate 18, height 5\' 1"  (1.549 m), weight 229 lb (103.9 kg), last menstrual period 09/06/2016, SpO2 95 %. Physical Examination:  General appearance - alert, well appearing, and in no distress Heart - normal rate and regular rhythm Abdomen - soft, nontender, nondistended Fundal Height:  consistent with twins Cervical Exam: Evaluated by digital exam., Position: posterior, Dilation: 0cm, Thickness: uneffaced and Consistency: firm and found to be / Long/Floating and fetal presentation is cephalic. Extremities: extremities normal, atraumatic, no cyanosis or edema and Homans sign is negative, no sign of DVT with DTRs 2+ bilaterally Membranes:intact  Fetal Monitoring:  reactive no decels  Labs:  Results for orders placed or performed during the hospital encounter of 04/26/17 (from the past 24 hour(s))  CBC with Differential   Collection Time: 04/26/17  7:28 PM  Result Value Ref Range   WBC 8.6 4.0 - 10.5 K/uL   RBC 3.62 (L) 3.87 - 5.11 MIL/uL   Hemoglobin 9.7 (L) 12.0 - 15.0 g/dL   HCT 21.330.5 (L) 08.636.0 - 57.846.0 %   MCV 84.3 78.0 - 100.0 fL   MCH 26.8 26.0 - 34.0 pg   MCHC 31.8 30.0 - 36.0 g/dL   RDW 46.914.3 62.911.5 - 52.815.5 %   Platelets 186 150 - 400 K/uL   Neutrophils Relative % 70 %   Neutro Abs 6.1 1.7 - 7.7 K/uL   Lymphocytes  Relative 22 %   Lymphs Abs 1.9 0.7 - 4.0 K/uL   Monocytes Relative 7 %   Monocytes Absolute 0.6 0.1 - 1.0 K/uL   Eosinophils Relative 1 %   Eosinophils Absolute 0.1 0.0 - 0.7 K/uL   Basophils Relative 0 %   Basophils Absolute 0.0 0.0 - 0.1 K/uL  Comprehensive metabolic panel   Collection Time: 04/26/17  7:28 PM  Result Value Ref Range   Sodium 138 135 - 145 mmol/L   Potassium 4.2 3.5 - 5.1 mmol/L   Chloride 108 101 - 111 mmol/L   CO2 21 (L) 22 - 32 mmol/L   Glucose, Bld 82 65 - 99 mg/dL   BUN 7 6 - 20 mg/dL   Creatinine, Ser 4.130.72 0.44 - 1.00 mg/dL   Calcium 8.8 (L) 8.9 - 10.3 mg/dL   Total Protein 5.8 (L) 6.5 - 8.1 g/dL   Albumin 2.5 (L) 3.5 - 5.0 g/dL   AST 15 15 - 41 U/L   ALT 12 (L) 14 - 54 U/L   Alkaline Phosphatase 205 (H) 38 - 126 U/L   Total Bilirubin 0.4 0.3 - 1.2 mg/dL   GFR calc non Af Amer >60 >60 mL/min   GFR calc Af Amer >60 >60 mL/min   Anion gap 9 5 - 15  Urinalysis, Routine w reflex microscopic   Collection Time: 04/26/17  7:54 PM  Result Value Ref Range   Color, Urine YELLOW YELLOW   APPearance CLEAR CLEAR  Specific Gravity, Urine 1.018 1.005 - 1.030   pH 6.0 5.0 - 8.0   Glucose, UA NEGATIVE NEGATIVE mg/dL   Hgb urine dipstick NEGATIVE NEGATIVE   Bilirubin Urine NEGATIVE NEGATIVE   Ketones, ur NEGATIVE NEGATIVE mg/dL   Protein, ur 914100 (A) NEGATIVE mg/dL   Nitrite NEGATIVE NEGATIVE   Leukocytes, UA TRACE (A) NEGATIVE   RBC / HPF 0-5 0 - 5 RBC/hpf   WBC, UA 0-5 0 - 5 WBC/hpf   Bacteria, UA RARE (A) NONE SEEN   Squamous Epithelial / LPF 0-5 (A) NONE SEEN   Mucus PRESENT   Protein / creatinine ratio, urine   Collection Time: 04/26/17  8:39 PM  Result Value Ref Range   Creatinine, Urine 149.40 mg/dL   Total Protein, Urine 103 mg/dL   Protein Creatinine Ratio 0.69 (H) 0.00 - 0.15 mg/mg[Cre]  Type and screen [facility]   Collection Time: 04/26/17  9:51 PM  Result Value Ref Range   ABO/RH(D) AB POS    Antibody Screen NEG     Sample Expiration 04/29/2017     Imaging Studies:     Currently EPIC will not allow sonographic studies to automatically populate into notes.  In the meantime, copy and paste results into note or free text.  Medications:  Scheduled . amoxicillin-clavulanate  1 tablet Oral Q12H  . betamethasone acetate-betamethasone sodium phosphate  12 mg Intramuscular Q24H  . docusate sodium  100 mg Oral Daily  . prenatal vitamin w/FE, FA  1 tablet Oral Q1200  . sodium chloride flush  3 mL Intravenous Q12H   I have reviewed the patient's current medications.  ASSESSMENT: Patient Active Problem List   Diagnosis Date Noted  . Twin gestation in third trimester 04/26/2017  . Chronic hypertension with superimposed preeclampsia 04/26/2017  . Otitis externa, left 04/26/2017  . GBS bacteriuria 04/21/2017  . Gestational hypertension 04/18/2017  . Dichorionic diamniotic twin pregnancy, antepartum 04/01/2017  . Transient hypertension of pregnancy 01/28/2017  . Rubella non-immune status, antepartum 01/07/2017  . Supervision of high risk pregnancy, antepartum 12/18/2016    PLAN: Preterm uterine irritability Chronic HTN plus superimposed pree Plan:  Procardia 20 mg x 1.    Tilda BurrowJohn V Lelon Chavez 04/27/2017,4:46 PM    Patient ID: Jackie DesanctisJaximica M Chavez, female   DOB: 08/10/1995, 21 y.o.   MRN: [id number]

## 2017-04-28 MED ORDER — INFLUENZA VAC SPLIT QUAD 0.5 ML IM SUSY
0.5000 mL | PREFILLED_SYRINGE | INTRAMUSCULAR | Status: AC
  Administered 2017-04-29: 0.5 mL via INTRAMUSCULAR
  Filled 2017-04-28: qty 0.5

## 2017-04-28 NOTE — Progress Notes (Signed)
24 Hour Urine start 04/28/17 at 0750

## 2017-04-28 NOTE — Progress Notes (Signed)
[place] She was living with family, but is no longer able to due to her pregnancy and unacceptable from family. Patient states minimal support-no involvement of FOB. She is currently staying with a friend who recently had a baby as well. Patient requests "any help she can get".

## 2017-04-28 NOTE — Progress Notes (Signed)
FACULTY PRACTICE ANTEPARTUM(COMPREHENSIVE) NOTE  Julieta Brett FairyM Mancuso is a 21 y.o. G1P0 at 8116w3d  Twin pregnancy [facility].as well as headache that was identified as related to otitis media by Dr Debroah LoopArnold, and this h/a has resolved with tx with Augmentin.   Fetal presentation is cephalic. Length of Stay:  1  Days  Subjective: Pt pressures have been trending up again this a.m., Most recently 159/75. Pt denies headache , scotoma ruq pain.  In Further discussion with the pt, she has no knowledge of prior hypertension, and has never been on bp meds. This morning she had a brief episode of contractions that spontaneously resolved, perceived as very uncomfortable. Pt has low pain tolerances. The earache is resolved  Patient reports the fetal movement as active. Patient reports uterine contraction  activity as brief episode of contraction this am, now resolved. Patient reports  vaginal bleeding as none. Patient describes fluid per vagina as None.  Vitals:  Blood pressure (!) 159/75, pulse 94, temperature 97.7 F (36.5 C), temperature source Oral, resp. rate 18, height 5\' 1"  (1.549 m), weight 243 lb 0.8 oz (110.2 kg), last menstrual period 09/06/2016, SpO2 94 %. Physical Examination:  General appearance - alert, well appearing, and in no distress, oriented to person, place, and time, overweight and well hydrated Heart - normal rate and regular rhythm Abdomen - soft, nontender, nondistended Fundal Height:  consistent with twins Cervical Exam: yesterday was long close high ballottable  presentation is cephalic and transverse by last u/s. Extremities: extremities normal, atraumatic, no cyanosis or edema and Homans sign is negative, no sign of DVT with DTRs 2+ bilaterally Membranes:intact  Fetal Monitoring:  Reactive x 2 , no decels on last night's monitoring,                                Has not been on monitor this am                                Toco this am  did not show significant contractions.  Labs:  No results found for this or any previous visit (from the past 24 hour(s)).  Imaging Studies:     Currently EPIC will not allow sonographic studies to automatically populate into notes.  In the meantime, copy and paste results into note or free text.  Medications:  Scheduled . amoxicillin-clavulanate  1 tablet Oral Q12H  . docusate sodium  100 mg Oral Daily  . prenatal vitamin w/FE, FA  1 tablet Oral Q1200  . sodium chloride flush  3 mL Intravenous Q12H     ASSESSMENT: Patient Active Problem List   Diagnosis Date Noted  . Twin gestation in third trimester 04/26/2017  . Chronic hypertension with superimposed preeclampsia 04/26/2017  . Otitis externa, left 04/26/2017  . GBS bacteriuria 04/21/2017  . Gestational hypertension 04/18/2017  . Dichorionic diamniotic twin pregnancy, antepartum 04/01/2017  . Transient hypertension of pregnancy 01/28/2017  . Rubella non-immune status, antepartum 01/07/2017  . Supervision of high risk pregnancy, antepartum 12/18/2016  Uncertain diagnosis of CHTN Preeclampsia Resolved otitis media, on Augmentin PLAN: 1. Probable inpt care til 34 wk, approaching severe range BP's  2. When again meets severe criteria would likely warrant delivery. 3  24 hr TP to be collected today 4 cbc, cmet ordered for the am monday Tilda BurrowJohn V Swayze Kozuch 04/28/2017,9:34 AM  Patient ID: Jackie Chavez, female   DOB: 02/03/1996, 21 y.o.   MRN: [id number]

## 2017-04-29 DIAGNOSIS — O119 Pre-existing hypertension with pre-eclampsia, unspecified trimester: Secondary | ICD-10-CM

## 2017-04-29 DIAGNOSIS — O30049 Twin pregnancy, dichorionic/diamniotic, unspecified trimester: Secondary | ICD-10-CM

## 2017-04-29 DIAGNOSIS — O99019 Anemia complicating pregnancy, unspecified trimester: Secondary | ICD-10-CM | POA: Diagnosis present

## 2017-04-29 DIAGNOSIS — R8271 Bacteriuria: Secondary | ICD-10-CM

## 2017-04-29 DIAGNOSIS — O99013 Anemia complicating pregnancy, third trimester: Secondary | ICD-10-CM

## 2017-04-29 LAB — CBC WITH DIFFERENTIAL/PLATELET
Basophils Absolute: 0 10*3/uL (ref 0.0–0.1)
Basophils Relative: 0 %
EOS ABS: 0 10*3/uL (ref 0.0–0.7)
Eosinophils Relative: 1 %
HEMATOCRIT: 26.4 % — AB (ref 36.0–46.0)
HEMOGLOBIN: 8.6 g/dL — AB (ref 12.0–15.0)
LYMPHS ABS: 2.1 10*3/uL (ref 0.7–4.0)
LYMPHS PCT: 25 %
MCH: 28.3 pg (ref 26.0–34.0)
MCHC: 32.6 g/dL (ref 30.0–36.0)
MCV: 86.8 fL (ref 78.0–100.0)
MONOS PCT: 4 %
Monocytes Absolute: 0.3 10*3/uL (ref 0.1–1.0)
NEUTROS ABS: 6 10*3/uL (ref 1.7–7.7)
NEUTROS PCT: 70 %
Platelets: 172 10*3/uL (ref 150–400)
RBC: 3.04 MIL/uL — ABNORMAL LOW (ref 3.87–5.11)
RDW: 14.5 % (ref 11.5–15.5)
WBC: 8.5 10*3/uL (ref 4.0–10.5)

## 2017-04-29 LAB — COMPREHENSIVE METABOLIC PANEL
ALT: 8 U/L — ABNORMAL LOW (ref 14–54)
AST: 12 U/L — ABNORMAL LOW (ref 15–41)
Albumin: 2.3 g/dL — ABNORMAL LOW (ref 3.5–5.0)
Alkaline Phosphatase: 162 U/L — ABNORMAL HIGH (ref 38–126)
Anion gap: 8 (ref 5–15)
BUN: 8 mg/dL (ref 6–20)
CHLORIDE: 108 mmol/L (ref 101–111)
CO2: 20 mmol/L — AB (ref 22–32)
CREATININE: 0.59 mg/dL (ref 0.44–1.00)
Calcium: 7.9 mg/dL — ABNORMAL LOW (ref 8.9–10.3)
GFR calc non Af Amer: >60 mL/min (ref >60)
Glucose, Bld: 98 mg/dL (ref 65–99)
POTASSIUM: 3.6 mmol/L (ref 3.5–5.1)
SODIUM: 136 mmol/L (ref 135–145)
Total Bilirubin: 0.1 mg/dL — ABNORMAL LOW (ref 0.3–1.2)
Total Protein: 5.1 g/dL — ABNORMAL LOW (ref 6.5–8.1)

## 2017-04-29 LAB — TYPE AND SCREEN
ABO/RH(D): AB POS
Antibody Screen: NEGATIVE

## 2017-04-29 LAB — PROTEIN, URINE, 24 HOUR
Collection Interval-UPROT: 24 hours
PROTEIN, 24H URINE: 1169 mg/d — AB (ref 50–100)
Protein, Urine: 57 mg/dL
URINE TOTAL VOLUME-UPROT: 2050 mL

## 2017-04-29 MED ORDER — FERROUS FUMARATE 324 (106 FE) MG PO TABS
1.0000 | ORAL_TABLET | Freq: Two times a day (BID) | ORAL | Status: DC
  Administered 2017-04-29 – 2017-04-30 (×3): 106 mg via ORAL
  Filled 2017-04-29 (×5): qty 1

## 2017-04-29 NOTE — Progress Notes (Signed)
1000 medications delayed due to meal service delay. Patient needs to take medications with full meals.  MD notified.

## 2017-04-29 NOTE — Progress Notes (Signed)
Patient ID: Claudia DesanctisJaximica M Deihl, female   DOB: 11/18/1995, 21 y.o.   MRN: [id number]  FACULTY PRACTICE ANTEPARTUM NOTE  Bridney Brett FairyM Marian is a 21 y.o. G1P0 at 7952w4d  who is admitted for elevated blood pressures.   Fetal presentation is cephalic. Length of Stay:  2  Days  Subjective: Patient without complaint. Denies headache, SOB, abdominal pain, scotoma.  Patient reports good fetal movement.   She reports no uterine contractions She reports no bleeding  She reports no loss of fluid per vagina.  Vitals:  Blood pressure (!) 147/65, pulse 76, temperature 98.5 F (36.9 C), temperature source Oral, resp. rate 20, height 5\' 1"  (1.549 m), weight 243 lb (110.2 kg), last menstrual period 09/06/2016, SpO2 98 %. Physical Examination:  General appearance - alert, well appearing, and in no distress Chest - clear to auscultation, no wheezes, rales or rhonchi, symmetric air entry Heart - normal rate, regular rhythm, normal S1, S2, no murmurs, rubs, clicks or gallops Abdomen - soft, nontender, nondistended, no masses or organomegaly Fundal Height:  consistent with twins Pelvic Exam:  Not done Extremities: extremities normal, atraumatic, no cyanosis or edema, Homans sign is negative, no sign of DVT and no edema, redness or tenderness in the calves or thighs  Membranes:intact  Fetal Monitoring:  Baseline: 130-140 bpm, Variability: Good {> 6 bpm), Accelerations: Reactive and Decelerations: Absent x6  Labs:  Results for orders placed or performed during the hospital encounter of 04/26/17 (from the past 24 hour(s))  Comprehensive metabolic panel   Collection Time: 04/29/17  5:40 AM  Result Value Ref Range   Sodium 136 135 - 145 mmol/L   Potassium 3.6 3.5 - 5.1 mmol/L   Chloride 108 101 - 111 mmol/L   CO2 20 (L) 22 - 32 mmol/L   Glucose, Bld 98 65 - 99 mg/dL   BUN 8 6 - 20 mg/dL   Creatinine, Ser 3.080.59 0.44 - 1.00 mg/dL   Calcium 7.9 (L) 8.9 - 10.3 mg/dL   Total Protein 5.1 (L) 6.5 - 8.1 g/dL   Albumin 2.3 (L) 3.5 - 5.0 g/dL   AST 12 (L) 15 - 41 U/L   ALT 8 (L) 14 - 54 U/L   Alkaline Phosphatase 162 (H) 38 - 126 U/L   Total Bilirubin 0.1 (L) 0.3 - 1.2 mg/dL   GFR calc non Af Amer >60 >60 mL/min   GFR calc Af Amer >60 >60 mL/min   Anion gap 8 5 - 15  CBC with Differential/Platelet   Collection Time: 04/29/17  5:40 AM  Result Value Ref Range   WBC 8.5 4.0 - 10.5 K/uL   RBC 3.04 (L) 3.87 - 5.11 MIL/uL   Hemoglobin 8.6 (L) 12.0 - 15.0 g/dL   HCT 65.726.4 (L) 84.636.0 - 96.246.0 %   MCV 86.8 78.0 - 100.0 fL   MCH 28.3 26.0 - 34.0 pg   MCHC 32.6 30.0 - 36.0 g/dL   RDW 95.214.5 84.111.5 - 32.415.5 %   Platelets 172 150 - 400 K/uL   Neutrophils Relative % 70 %   Neutro Abs 6.0 1.7 - 7.7 K/uL   Lymphocytes Relative 25 %   Lymphs Abs 2.1 0.7 - 4.0 K/uL   Monocytes Relative 4 %   Monocytes Absolute 0.3 0.1 - 1.0 K/uL   Eosinophils Relative 1 %   Eosinophils Absolute 0.0 0.0 - 0.7 K/uL   Basophils Relative 0 %   Basophils Absolute 0.0 0.0 - 0.1 K/uL  Type and screen [facility]  Collection Time: 04/29/17  5:40 AM  Result Value Ref Range   ABO/RH(D) AB POS    Antibody Screen NEG    Sample Expiration 05/02/2017     Imaging Studies:       Medications:  Scheduled . amoxicillin-clavulanate  1 tablet Oral Q12H  . docusate sodium  100 mg Oral Daily  . Ferrous Fumarate  1 tablet Oral BID  . prenatal vitamin w/FE, FA  1 tablet Oral Q1200  . sodium chloride flush  3 mL Intravenous Q12H   I have reviewed the patient's current medications.  ASSESSMENT: Active Problems:   Twin gestation in third trimester   Chronic hypertension with superimposed preeclampsia   Otitis externa, left   Anemia affecting pregnancy   PLAN: 1. CHTN with superimposed preeclampsia vs mild preeclampsia  In reviewing this patient's chart, I'm not certain this patient has chronic hypertension.  Pressures mild range  Labs controlled.   Will continue to evaluate. If remains stable, may be reasonable  to discharge to home for continued outpatient management. 2. Twin gestation  NST reactive x6 3. Otitis externa, left  Improved 4. Anemia  Iron supplementation ordered  Continue routine antenatal care.   Levie HeritageStinson, Breck [place] J, DO 04/29/2017,11:13 AM

## 2017-04-30 ENCOUNTER — Encounter: Payer: Self-pay | Admitting: Family Medicine

## 2017-04-30 DIAGNOSIS — O1493 Unspecified pre-eclampsia, third trimester: Secondary | ICD-10-CM

## 2017-04-30 DIAGNOSIS — H60312 Diffuse otitis externa, left ear: Secondary | ICD-10-CM

## 2017-04-30 MED ORDER — AMOXICILLIN-POT CLAVULANATE 875-125 MG PO TABS: 1.0000 | ORAL_TABLET | Freq: Two times a day (BID) | ORAL | 0 refills | Status: DC

## 2017-04-30 MED ORDER — FERROUS FUMARATE 324 (106 FE) MG PO TABS: 1.0000 | ORAL_TABLET | Freq: Two times a day (BID) | ORAL | 2 refills | Status: DC

## 2017-04-30 NOTE — Discharge Summary (Signed)
OB Discharge Summary     Patient Name: Jackie Chavez DOB: 01/05/1996 MRN: [id number]  Date of admission: 04/26/2017 Delivering MD: This patient has no babies on file.  Date of discharge: 04/30/2017  Admitting diagnosis: Pre-eclampsia in third trimester [O14.93] Intrauterine pregnancy: 3533w5d     Secondary diagnosis:  Active Problems:   Twin gestation in third trimester   Preeclampsia, third trimester   Otitis externa, left   Anemia affecting pregnancy  Additional problems: none     Discharge diagnosis: Same as admission                                                                                                 Hospital course: Patient is a 21 year old G1 P0 at 33 weeks and 5 days with di-di-twin pregnancy.  She was admitted due to elevated blood pressure.  Blood pressure remained in the mild range throughout hospitalization for the past 4 days.  There was some concern of whether the patient had chronic hypertension, although it appears that the patient does not.  Her NSTs have been reactive.  She is being sent home today to follow-up with her primary OBs office Friday.  Signs and symptoms of severe preeclampsia given to the patient and the patient was instructed to return if these were to develop.  Physical exam  Vitals:   04/29/17 2006 04/30/17 0054 04/30/17 0613 04/30/17 0800  BP: (!) 145/75  (!) 149/72 (!) 144/78  Pulse: 78 76 74 91  Resp: 20 16 16 16   Temp: 98.4 F (36.9 C) 97.9 F (36.6 C) 98 F (36.7 C) 98.4 F (36.9 C)  TempSrc: Oral Oral Oral Oral  SpO2: 98% 97% 98% 97%  Weight:   242 lb 12 oz (110.1 kg)   Height:       General: alert, cooperative and no distress Heart: Regular rate normal S1-S2 sounds.  No murmurs rubs or gallops.  Lungs: Clear to auscultation bilaterally with no wheezes, rales, rhonchi  Uterine Fundus: Gravid with twins DVT Evaluation: No evidence of DVT seen on physical exam. Negative Homan's sign. No cords or calf tenderness. No  significant calf/ankle edema. Labs: Lab Results  Component Value Date   WBC 8.5 04/29/2017   HGB 8.6 (L) 04/29/2017   HCT 26.4 (L) 04/29/2017   MCV 86.8 04/29/2017   PLT 172 04/29/2017   CMP Latest Ref Rng & Units 04/29/2017  Glucose 65 - 99 mg/dL 98  BUN 6 - 20 mg/dL 8  Creatinine 7.820.44 - 9.561.00 mg/dL 2.130.59  Sodium 086135 - 578145 mmol/L 136  Potassium 3.5 - 5.1 mmol/L 3.6  Chloride 101 - 111 mmol/L 108  CO2 22 - 32 mmol/L 20(L)  Calcium 8.9 - 10.3 mg/dL 7.9(L)  Total Protein 6.5 - 8.1 g/dL 5.1(L)  Total Bilirubin 0.3 - 1.2 mg/dL 4.6(N0.1(L)  Alkaline Phos 38 - 126 U/L 162(H)  AST 15 - 41 U/L 12(L)  ALT 14 - 54 U/L 8(L)    Discharge instruction: per After Visit Summary and "Baby and Me Booklet".  After visit meds:  Allergies as of 04/30/2017   No Known  Allergies     Medication List    STOP taking these medications   azithromycin 250 MG tablet Commonly known as:  ZITHROMAX   metroNIDAZOLE 500 MG tablet Commonly known as:  FLAGYL   terconazole 0.4 % vaginal cream Commonly known as:  TERAZOL 7     TAKE these medications   amoxicillin-clavulanate 875-125 MG tablet Commonly known as:  AUGMENTIN Take 1 tablet by mouth every 12 (twelve) hours.   COMFORT FIT MATERNITY SUPP MED Misc Wear daily when ambulating   Ferrous Fumarate 324 (106 Fe) MG Tabs tablet Commonly known as:  HEMOCYTE - 106 mg FE Take 1 tablet (106 mg of iron total) by mouth 2 (two) times daily.   VITAFOL GUMMIES 3.33-0.333-34.8 MG Chew Chew 1 tablet by mouth daily.       Diet: routine diet  Activity: Advance as tolerated. Pelvic rest for 6 weeks.   Outpatient follow WU:JWJXBJup:Friday at 9:45 Follow up Appt: Future Appointments  Date Time Provider Department Center  05/03/2017  9:45 AM Brock BadHarper, Charles A, MD [facility] None  05/10/2017  8:30 AM [facility] US 5 [facility]   Follow up Visit:No Follow-up on file.  35 minutes were spent on the discharge of this patient.  04/30/2017 Levie HeritageJacob J Stinson, DO

## 2017-04-30 NOTE — Progress Notes (Signed)
Pt discharged with printed instructions. Pt verbalized an understanding. No concerns noted. Eudora Guevarra L Younis Mathey, RN 

## 2017-05-03 ENCOUNTER — Telehealth (HOSPITAL_COMMUNITY): Payer: Self-pay | Admitting: Obstetrics and Gynecology

## 2017-05-03 ENCOUNTER — Other Ambulatory Visit: Payer: Self-pay | Admitting: Obstetrics

## 2017-05-03 ENCOUNTER — Ambulatory Visit (INDEPENDENT_AMBULATORY_CARE_PROVIDER_SITE_OTHER): Payer: Medicaid Other | Admitting: Obstetrics

## 2017-05-03 ENCOUNTER — Encounter: Payer: Self-pay | Admitting: Obstetrics

## 2017-05-03 ENCOUNTER — Telehealth: Payer: Self-pay

## 2017-05-03 VITALS — BP 160/91 | HR 89 | Wt 242.2 lb

## 2017-05-03 DIAGNOSIS — O1493 Unspecified pre-eclampsia, third trimester: Secondary | ICD-10-CM

## 2017-05-03 DIAGNOSIS — O0993 Supervision of high risk pregnancy, unspecified, third trimester: Secondary | ICD-10-CM

## 2017-05-03 DIAGNOSIS — O30049 Twin pregnancy, dichorionic/diamniotic, unspecified trimester: Secondary | ICD-10-CM

## 2017-05-03 DIAGNOSIS — O30043 Twin pregnancy, dichorionic/diamniotic, third trimester: Secondary | ICD-10-CM

## 2017-05-03 DIAGNOSIS — O0990 Supervision of high risk pregnancy, unspecified, unspecified trimester: Secondary | ICD-10-CM

## 2017-05-03 NOTE — Telephone Encounter (Signed)
CNM called patient because patient was supposed to come from office to MAU for evaluation of new onset headache and severe range blood pressures. Patient with known preeclampsia without severe features before today.  Patient hung up phone on CNM on first call and when CNM tried to call back, it went to voicemail. Message left encouraging patient to come to MAU to be evaluated or to call CNM back.  Rolm BookbinderCaroline M Wasif Simonich, CNM 05/03/17 4:24 PM

## 2017-05-03 NOTE — Telephone Encounter (Signed)
Jackie Chavez who states she is the best friend of patient Jackie Chavez returning call from MAU provider. She states that [facility] because she went to get 2 baby car seats in case she gets admitted for delivery of infants. She states that the patient "just texted me to say she was 10 mins away from my house and [facility]. I am going to make sure she comes to the hospital right away. We should be there by 1700."  Raelyn Moraolitta Bonne Whack, CNM  05/03/2017 4:36 PM

## 2017-05-03 NOTE — Progress Notes (Signed)
Subjective:  Jackie Chavez is a 21 y.o. G1P0 at 2646w1d being seen today for ongoing prenatal care.  She is currently monitored for the following issues for this high-risk pregnancy and has Supervision of high risk pregnancy, antepartum; Rubella non-immune status, antepartum; Transient hypertension of pregnancy; Dichorionic diamniotic twin pregnancy, antepartum; GBS bacteriuria; Twin gestation in third trimester; Preeclampsia, third trimester; Otitis externa, left; and Anemia affecting pregnancy on their problem list.  Patient reports severe headache last night and irregular UC's.  Denies visual changes.  Headache less intense today.  Contractions: Irregular. Vag. Bleeding: None.  Movement: Present. Denies leaking of fluid.   The following portions of the patient's history were reviewed and updated as appropriate: allergies, current medications, past family history, past medical history, past social history, past surgical history and problem list. Problem list updated.  Objective:   Vitals:   05/03/17 1044  BP: (!) 160/91  Pulse: 89  Weight: 242 lb 3.2 oz (109.9 kg)    Fetal Status: Fetal Heart Rate (bpm): NST-R   Movement: Present     General:  Alert, oriented and cooperative. Patient is in no acute distress.  Skin: Skin is warm and dry. No rash noted.   Cardiovascular: Normal heart rate noted  Respiratory: Normal respiratory effort, no problems with respiration noted  Abdomen: Soft, gravid, appropriate for gestational age. Pain/Pressure: Present     Pelvic:  Cervical exam deferred        Extremities: Normal range of motion.  Edema: None  Mental Status: Normal mood and affect. Normal behavior. Normal judgment and thought content.   Urinalysis:      NST:  Reactive.  Baseline FHR's 140's.  15x15 accels.  Occasional sharp variable.   Good variability.  Assessment and Plan:  Pregnancy: G1P0 at 5146w1d  1. Supervision of high risk pregnancy, antepartum Rx: - Fetal nonstress test;  Future - US MFM FETAL BPP WO NON STRESS; Future  2. Dichorionic diamniotic twin pregnancy, antepartum   3. Pre-eclampsia in third trimester - elevated BP with headache - [facility]  Preterm labor symptoms and general obstetric precautions including but not limited to vaginal bleeding, contractions, leaking of fluid and fetal movement were reviewed in detail with the patient. Please refer to After Visit Summary for other counseling recommendations.   Return in about 1 week (around 05/10/2017) for ROB.  NST .   Brock BadHarper, Nkosi Cortright A, MD

## 2017-05-03 NOTE — Progress Notes (Signed)
Pt c/o irregular contractions.

## 2017-05-04 ENCOUNTER — Inpatient Hospital Stay (HOSPITAL_COMMUNITY)
Admission: AD | Admit: 2017-05-04 | Discharge: 2017-05-08 | DRG: 788 | Disposition: A | Discharge status: Home / Self Care | Payer: Medicaid Other | Source: Ambulatory Visit | Attending: Obstetrics and Gynecology | Admitting: Obstetrics and Gynecology

## 2017-05-04 ENCOUNTER — Encounter (HOSPITAL_COMMUNITY): Payer: Self-pay

## 2017-05-04 DIAGNOSIS — O1414 Severe pre-eclampsia complicating childbirth: Secondary | ICD-10-CM | POA: Diagnosis present

## 2017-05-04 DIAGNOSIS — O99824 Streptococcus B carrier state complicating childbirth: Secondary | ICD-10-CM | POA: Diagnosis present

## 2017-05-04 DIAGNOSIS — O9902 Anemia complicating childbirth: Secondary | ICD-10-CM | POA: Diagnosis present

## 2017-05-04 DIAGNOSIS — O1493 Unspecified pre-eclampsia, third trimester: Secondary | ICD-10-CM | POA: Diagnosis present

## 2017-05-04 DIAGNOSIS — Z87891 Personal history of nicotine dependence: Secondary | ICD-10-CM

## 2017-05-04 DIAGNOSIS — O30043 Twin pregnancy, dichorionic/diamniotic, third trimester: Secondary | ICD-10-CM | POA: Diagnosis present

## 2017-05-04 DIAGNOSIS — Z3A34 34 weeks gestation of pregnancy: Secondary | ICD-10-CM

## 2017-05-04 DIAGNOSIS — O0990 Supervision of high risk pregnancy, unspecified, unspecified trimester: Secondary | ICD-10-CM

## 2017-05-04 DIAGNOSIS — D649 Anemia, unspecified: Secondary | ICD-10-CM | POA: Diagnosis present

## 2017-05-04 DIAGNOSIS — O30049 Twin pregnancy, dichorionic/diamniotic, unspecified trimester: Secondary | ICD-10-CM | POA: Diagnosis present

## 2017-05-04 DIAGNOSIS — R8271 Bacteriuria: Secondary | ICD-10-CM

## 2017-05-04 DIAGNOSIS — Z3490 Encounter for supervision of normal pregnancy, unspecified, unspecified trimester: Secondary | ICD-10-CM | POA: Diagnosis present

## 2017-05-04 HISTORY — DX: Anemia, unspecified: D64.9

## 2017-05-04 LAB — URINALYSIS, ROUTINE W REFLEX MICROSCOPIC
Bilirubin Urine: NEGATIVE
Glucose, UA: NEGATIVE mg/dL
Hgb urine dipstick: NEGATIVE
Ketones, ur: NEGATIVE mg/dL
Nitrite: NEGATIVE
Protein, ur: >=300 mg/dL — AB
SPECIFIC GRAVITY, URINE: 1.024 (ref 1.005–1.030)
pH: 6 (ref 5.0–8.0)

## 2017-05-04 LAB — PROTEIN / CREATININE RATIO, URINE
CREATININE, URINE: 180 mg/dL
Protein Creatinine Ratio: 2.26 mg/mg{Cre} — ABNORMAL HIGH (ref 0.00–0.15)
TOTAL PROTEIN, URINE: 406 mg/dL

## 2017-05-04 LAB — COMPREHENSIVE METABOLIC PANEL
ALBUMIN: 2.5 g/dL — AB (ref 3.5–5.0)
ALT: 11 U/L — ABNORMAL LOW (ref 14–54)
ANION GAP: 10 (ref 5–15)
AST: 16 U/L (ref 15–41)
Alkaline Phosphatase: 223 U/L — ABNORMAL HIGH (ref 38–126)
BILIRUBIN TOTAL: 0.3 mg/dL (ref 0.3–1.2)
BUN: 11 mg/dL (ref 6–20)
CO2: 19 mmol/L — AB (ref 22–32)
Calcium: 8.4 mg/dL — ABNORMAL LOW (ref 8.9–10.3)
Chloride: 106 mmol/L (ref 101–111)
Creatinine, Ser: 0.6 mg/dL (ref 0.44–1.00)
GFR calc Af Amer: >60 mL/min (ref >60)
GFR calc non Af Amer: >60 mL/min (ref >60)
GLUCOSE: 86 mg/dL (ref 65–99)
POTASSIUM: 4.2 mmol/L (ref 3.5–5.1)
SODIUM: 135 mmol/L (ref 135–145)
TOTAL PROTEIN: 5.8 g/dL — AB (ref 6.5–8.1)

## 2017-05-04 LAB — TYPE AND SCREEN
ABO/RH(D): AB POS
ANTIBODY SCREEN: NEGATIVE

## 2017-05-04 LAB — CBC
HEMATOCRIT: 30.2 % — AB (ref 36.0–46.0)
HEMOGLOBIN: 9.7 g/dL — AB (ref 12.0–15.0)
MCH: 27.6 pg (ref 26.0–34.0)
MCHC: 32.1 g/dL (ref 30.0–36.0)
MCV: 86 fL (ref 78.0–100.0)
Platelets: 216 10*3/uL (ref 150–400)
RBC: 3.51 MIL/uL — AB (ref 3.87–5.11)
RDW: 15.4 % (ref 11.5–15.5)
WBC: 8.9 10*3/uL (ref 4.0–10.5)

## 2017-05-04 MED ORDER — OXYTOCIN 40 UNITS IN LACTATED RINGERS INFUSION - SIMPLE MED: 2.5000 [IU]/h | INTRAVENOUS | Status: DC

## 2017-05-04 MED ORDER — OXYCODONE-ACETAMINOPHEN 5-325 MG PO TABS: 1.0000 | ORAL_TABLET | ORAL | Status: DC | PRN

## 2017-05-04 MED ORDER — HYDRALAZINE HCL 20 MG/ML IJ SOLN: 10.0000 mg | Freq: Once | INTRAMUSCULAR | Status: DC | PRN

## 2017-05-04 MED ORDER — LACTATED RINGERS IV SOLN
INTRAVENOUS | Status: DC
  Administered 2017-05-04: 18:00:00 via INTRAVENOUS

## 2017-05-04 MED ORDER — OXYCODONE-ACETAMINOPHEN 5-325 MG PO TABS: 2.0000 | ORAL_TABLET | ORAL | Status: DC | PRN

## 2017-05-04 MED ORDER — PHENYLEPHRINE 40 MCG/ML (10ML) SYRINGE FOR IV PUSH (FOR BLOOD PRESSURE SUPPORT): 80.0000 ug | PREFILLED_SYRINGE | INTRAVENOUS | Status: DC | PRN

## 2017-05-04 MED ORDER — ONDANSETRON HCL 4 MG/2ML IJ SOLN: 4.0000 mg | Freq: Four times a day (QID) | INTRAMUSCULAR | Status: DC | PRN

## 2017-05-04 MED ORDER — MAGNESIUM SULFATE BOLUS VIA INFUSION
4.0000 g | Freq: Once | INTRAVENOUS | Status: AC
  Administered 2017-05-04: 4 g via INTRAVENOUS
  Filled 2017-05-04: qty 500

## 2017-05-04 MED ORDER — PENICILLIN G POTASSIUM 5000000 UNITS IJ SOLR
5.0000 10*6.[IU] | Freq: Once | INTRAVENOUS | Status: AC
  Administered 2017-05-04: 5 10*6.[IU] via INTRAVENOUS
  Filled 2017-05-04: qty 5

## 2017-05-04 MED ORDER — EPHEDRINE 5 MG/ML INJ: 10.0000 mg | INTRAVENOUS | Status: DC | PRN

## 2017-05-04 MED ORDER — MAGNESIUM SULFATE 40 G IN LACTATED RINGERS - SIMPLE
2.0000 g/h | INTRAVENOUS | Status: AC
  Administered 2017-05-04 – 2017-05-05 (×3): 2 g/h via INTRAVENOUS
  Filled 2017-05-04: qty 500
  Filled 2017-05-04 (×2): qty 40

## 2017-05-04 MED ORDER — TERBUTALINE SULFATE 1 MG/ML IJ SOLN: 0.2500 mg | Freq: Once | INTRAMUSCULAR | Status: DC | PRN

## 2017-05-04 MED ORDER — PENICILLIN G POT IN DEXTROSE 60000 UNIT/ML IV SOLN
3.0000 10*6.[IU] | INTRAVENOUS | Status: DC
  Administered 2017-05-04: 3 10*6.[IU] via INTRAVENOUS
  Filled 2017-05-04 (×4): qty 50

## 2017-05-04 MED ORDER — ACETAMINOPHEN 325 MG PO TABS
650.0000 mg | ORAL_TABLET | ORAL | Status: DC | PRN
  Administered 2017-05-04: 650 mg via ORAL
  Filled 2017-05-04: qty 2

## 2017-05-04 MED ORDER — LIDOCAINE HCL (PF) 1 % IJ SOLN: 30.0000 mL | INTRAMUSCULAR | Status: DC | PRN

## 2017-05-04 MED ORDER — DIPHENHYDRAMINE HCL 50 MG/ML IJ SOLN: 25.0000 mg | Freq: Four times a day (QID) | INTRAMUSCULAR | Status: DC | PRN

## 2017-05-04 MED ORDER — FLEET ENEMA 7-19 GM/118ML RE ENEM: 1.0000 | ENEMA | RECTAL | Status: DC | PRN

## 2017-05-04 MED ORDER — OXYTOCIN BOLUS FROM INFUSION: 500.0000 mL | Freq: Once | INTRAVENOUS | Status: DC

## 2017-05-04 MED ORDER — LABETALOL HCL 5 MG/ML IV SOLN
20.0000 mg | INTRAVENOUS | Status: DC | PRN
  Administered 2017-05-04 (×2): 20 mg via INTRAVENOUS
  Filled 2017-05-04 (×2): qty 4

## 2017-05-04 MED ORDER — LACTATED RINGERS IV SOLN: 500.0000 mL | Freq: Once | INTRAVENOUS | Status: DC

## 2017-05-04 MED ORDER — SOD CITRATE-CITRIC ACID 500-334 MG/5ML PO SOLN
30.0000 mL | ORAL | Status: DC | PRN
  Filled 2017-05-04: qty 15

## 2017-05-04 MED ORDER — MISOPROSTOL 50MCG HALF TABLET
50.0000 ug | ORAL_TABLET | ORAL | Status: DC | PRN
  Administered 2017-05-04: 50 ug via ORAL
  Filled 2017-05-04: qty 1

## 2017-05-04 MED ORDER — LACTATED RINGERS IV SOLN
INTRAVENOUS | Status: DC
  Administered 2017-05-05: 01:00:00 via INTRAVENOUS

## 2017-05-04 MED ORDER — DIPHENHYDRAMINE HCL 50 MG/ML IJ SOLN: 12.5000 mg | INTRAMUSCULAR | Status: DC | PRN

## 2017-05-04 MED ORDER — FENTANYL 2.5 MCG/ML BUPIVACAINE 1/10 % EPIDURAL INFUSION (WH - ANES): 14.0000 mL/h | INTRAMUSCULAR | Status: DC | PRN

## 2017-05-04 MED ORDER — LACTATED RINGERS IV SOLN: 500.0000 mL | INTRAVENOUS | Status: DC | PRN

## 2017-05-04 NOTE — Progress Notes (Signed)
Labor Progress Note Jackie Chavez is a 21 y.o. G1P0 at 2646w2d presented for IOL for preeclampsia with severe features.  S: No complaints  O:  BP (!) 161/91   Pulse 97   Temp 98.4 F (36.9 C) (Oral)   Resp 18   Ht 5\' 1"  (1.549 m)   Wt 246 lb (111.6 kg)   LMP 09/06/2016   SpO2 98%   BMI 46.48 kg/m  EFM: Twin A: 145 bpm/mod var/no decels Twin B: 140 bpm/ mod var/no decels  CVE: Dilation: 1 Effacement (%): 50 Cervical Position: Posterior Station: -3 Presentation: Vertex Exam by:: Dr. Rachelle HoraMoss   [name]: 21 y.o. G1P0 1346w2d here for IOL for preeclampsia with severe features. #Labor: foley bulb placed. SROM with clear fluid. #FWB: cat 1/cat 1 #GBS positive- PCN #preeclampsia with severe features- labetolol as needed. Blood pressure currently well controlled. Continue magnesium. Asymptomatic currently.  Kittie Krizan, DO 9:00 PM

## 2017-05-04 NOTE — H&P (Signed)
LABOR AND DELIVERY ADMISSION HISTORY AND PHYSICAL NOTE  Jackie Chavez is a 21 y.o. female G1P0 with IUP at [redacted]w[redacted]d by  LMP presenting for IOL due to pre-eclampsia w/ severe features (severe range BPs and headache). She reports positive fetal movement. She denies leakage of fluid or vaginal bleeding.  Prenatal History/Complications: PNC at [place] Pregnancy complications:  - GBSpos -preeclampsia -rubella non-immune  Clinic   [facility] Prenatal Labs  Dating  LMP 09/06/16 Blood type: --/--/AB POS (12/10 0540)   Genetic Screen 1 Screen:    AFP:     Quad:     NIPS: Antibody:NEG (12/10 0540)  Anatomic [place] 01/18/17 Rubella: <0.90 (07/31 1138)  GTT Early:               Third trimester: nl 2 hour RPR: Non Reactive (11/19 1059)   Flu vaccine Declined  HBsAg: Negative (07/31 1138)   TDaP vaccine    03/04/2017                                           Rhogam:n/a AB+ HIV:   NR  Baby Food   Breastfeed                                            GBS:   Contraception   Undecided Pap:n/a <21  Circumcision   Yes if boy   Pediatrician   undecided CF:Neg  Support Person   Sister-Brittany SMA: NEG  Prenatal Classes   yes Hgb electrophoresis: Neg    Past Medical History: Past Medical History:  Diagnosis Date  . Anemia   . Anxiety   . BV (bacterial vaginosis)   . Headache    migraines  . Hx of chlamydia infection 10/17/2016    Past Surgical History: Past Surgical History:  Procedure Laterality Date  . NO PAST SURGERIES      Obstetrical History: OB History    Gravida Para Term Preterm AB Living   1             SAB TAB Ectopic Multiple Live Births                  Social History: Social History   Socioeconomic History  . Marital status: Single    Spouse name: None  . Number of children: None  . Years of education: None  . Highest education level: None  Social Needs  . Financial resource strain: None  . Food insecurity - worry: None  . Food insecurity - inability: None  .  Transportation needs - medical: None  . Transportation needs - non-medical: None  Occupational History  . None  Tobacco Use  . Smoking status: Former Games developer  . Smokeless tobacco: Never Used  Substance and Sexual Activity  . Alcohol use: No  . Drug use: No  . Sexual activity: Yes    Comment: currently pregnant  Other Topics Concern  . None  Social History Narrative  . None    Family History: Family History  Problem Relation Age of Onset  . COPD Mother   . Diabetes Mother   . Mental illness Mother   . Alcohol abuse Mother   . Drug abuse Mother   . Diabetes Father   . Hypertension Father   .  Alcohol abuse Father   . Drug abuse Father     Allergies: No Known Allergies  Medications Prior to Admission  Medication Sig Dispense Refill Last Dose  . amoxicillin-clavulanate (AUGMENTIN) 875-125 MG tablet Take 1 tablet by mouth every 12 (twelve) hours. 6 tablet 0 Taking  . Elastic Bandages & Supports (COMFORT FIT MATERNITY SUPP MED) MISC Wear daily when ambulating 1 each 0 Taking  . Ferrous Fumarate (HEMOCYTE - 106 MG FE) 324 (106 Fe) MG TABS tablet Take 1 tablet (106 mg of iron total) by mouth 2 (two) times daily. 30 tablet 2 Taking  . Prenatal Vit-Fe Phos-FA-Omega (VITAFOL GUMMIES) 3.33-0.333-34.8 MG CHEW Chew 1 tablet by mouth daily. 90 tablet 3 Taking     Review of Systems  All systems reviewed and negative except as stated in HPI  Physical Exam Blood pressure (!) 156/92, pulse 92, temperature 98.4 F (36.9 C), temperature source Oral, resp. rate 16, height 5\' 1"  (1.549 m), weight 111.6 kg (246 lb), last menstrual period 09/06/2016, SpO2 98 %, unknown if currently breastfeeding. General appearance: alert, cooperative, appears stated age and no distress Lungs: no IWB Heart: regular rate, pulses intact Reflexes: 1+ LE bilaterally Abdomen: soft, non-tender Extremities: No calf swelling or tenderness Presentation: vertex/vertex per Dr. Jolayne Pantheronstant via US in mau Fetal  monitoring: external Uterine activity: toco Dilation: Fingertip Effacement (%): Thick Station: -2 Exam by:: Parke SimmersBland, DO  Prenatal labs: ABO, Rh: --/--/AB POS (12/15 1735) Antibody: NEG (12/15 1735) Rubella: <0.90 (07/31 1138) RPR: Non Reactive (11/19 1059)  HBsAg: Negative (07/31 1138)  HIV:   NR GC/Chlamydia: neg GBS:   pos 1 hr Glucola: wnl Genetic screening:  Not on file Anatomy US: normal  Prenatal Transfer Tool  Maternal Diabetes: No Genetic Screening: Declined/not on file Maternal Ultrasounds/Referrals: Normal Fetal Ultrasounds or other Referrals:  None Maternal Substance Abuse:  No Significant Maternal Medications:  None Significant Maternal Lab Results: Lab values include: Group B Strep positive, urine protein/creatinine ratio 2.26, Hgb 9.7, platelets 216,   Results for orders placed or performed during the hospital encounter of 05/04/17 (from the past 24 hour(s))  Urinalysis, Routine w reflex microscopic   Collection Time: 05/04/17  5:04 PM  Result Value Ref Range   Color, Urine YELLOW YELLOW   APPearance HAZY (A) CLEAR   Specific Gravity, Urine 1.024 1.005 - 1.030   pH 6.0 5.0 - 8.0   Glucose, UA NEGATIVE NEGATIVE mg/dL   Hgb urine dipstick NEGATIVE NEGATIVE   Bilirubin Urine NEGATIVE NEGATIVE   Ketones, ur NEGATIVE NEGATIVE mg/dL   Protein, ur >=161>=300 (A) NEGATIVE mg/dL   Nitrite NEGATIVE NEGATIVE   Leukocytes, UA TRACE (A) NEGATIVE   RBC / HPF 0-5 0 - 5 RBC/hpf   WBC, UA 6-30 0 - 5 WBC/hpf   Bacteria, UA RARE (A) NONE SEEN   Squamous Epithelial / LPF 6-30 (A) NONE SEEN   Mucus PRESENT   Protein / creatinine ratio, urine   Collection Time: 05/04/17  5:04 PM  Result Value Ref Range   Creatinine, Urine 180.00 mg/dL   Total Protein, Urine 406 mg/dL   Protein Creatinine Ratio 2.26 (H) 0.00 - 0.15 mg/mg[Cre]  Comprehensive metabolic panel   Collection Time: 05/04/17  5:35 PM  Result Value Ref Range   Sodium 135 135 - 145 mmol/L   Potassium 4.2 3.5 - 5.1  mmol/L   Chloride 106 101 - 111 mmol/L   CO2 19 (L) 22 - 32 mmol/L   Glucose, Bld 86 65 - 99 mg/dL  BUN 11 6 - 20 mg/dL   Creatinine, Ser 1.610.60 0.44 - 1.00 mg/dL   Calcium 8.4 (L) 8.9 - 10.3 mg/dL   Total Protein 5.8 (L) 6.5 - 8.1 g/dL   Albumin 2.5 (L) 3.5 - 5.0 g/dL   AST 16 15 - 41 U/L   ALT 11 (L) 14 - 54 U/L   Alkaline Phosphatase 223 (H) 38 - 126 U/L   Total Bilirubin 0.3 0.3 - 1.2 mg/dL   GFR calc non Af Amer >60 >60 mL/min   GFR calc Af Amer >60 >60 mL/min   Anion gap 10 5 - 15  CBC   Collection Time: 05/04/17  5:35 PM  Result Value Ref Range   WBC 8.9 4.0 - 10.5 K/uL   RBC 3.51 (L) 3.87 - 5.11 MIL/uL   Hemoglobin 9.7 (L) 12.0 - 15.0 g/dL   HCT 09.630.2 (L) 04.536.0 - 40.946.0 %   MCV 86.0 78.0 - 100.0 fL   MCH 27.6 26.0 - 34.0 pg   MCHC 32.1 30.0 - 36.0 g/dL   RDW 81.115.4 91.411.5 - 78.215.5 %   Platelets 216 150 - 400 K/uL  Type and screen [facility]   Collection Time: 05/04/17  5:35 PM  Result Value Ref Range   ABO/RH(D) AB POS    Antibody Screen NEG    Sample Expiration 05/07/2017     Patient Active Problem List   Diagnosis Date Noted  . Encounter for induction of labor 05/04/2017  . Anemia affecting pregnancy 04/29/2017  . Twin gestation in third trimester 04/26/2017  . Preeclampsia, third trimester 04/26/2017  . Otitis externa, left 04/26/2017  . GBS bacteriuria 04/21/2017  . Dichorionic diamniotic twin pregnancy, antepartum 04/01/2017  . Transient hypertension of pregnancy 01/28/2017  . Rubella non-immune status, antepartum 01/07/2017  . Supervision of high risk pregnancy, antepartum 12/18/2016    Assessment: Jackie Chavez is a 21 y.o. G1P0 with twin IUP at 4516w2d here for IOL 2/2 pre-eclampsia with severe features. On mag/labetalol/pcn.     #Labor: Start cervical ripening  #Pain: epidural #FWB: Cat 1 #ID:  GBS pos #MOF: breast #MOC:undecided #Circ:  Female/female  Marthenia RollingScott Bland 05/04/2017, 7:05 PM  OB FELLOW HISTORY AND PHYSICAL  ATTESTATION  I have seen and examined this patient; I agree with above documentation in the resident's note; edited where needed.  #Severe PreE: IV Mag. Labetalol protocol for BP management. Start IOL #Preterm at 4116w2d: s/p BMZ x2 on 12/7 and 12/8 #Twin pregnancy: Vertex, vertex by bedside U/S per Dr. Jolayne Pantheronstant #IOL: Start cervical ripening with cytotec #FWB: Cat I x 2 #ID: GBS postive, start IV PCN  Frederik PearJulie P Degele, MD OB Fellow 05/04/2017, 8:29 PM

## 2017-05-04 NOTE — MAU Provider Note (Signed)
History     CSN: [id number]  Arrival date and time: 05/04/17 1649   First Provider Initiated Contact with Patient 05/04/17 1731     Chief Complaint  Patient presents with  . Hypertension  . Headache   HPI Jackie Chavez is a 21 y.o. G1P0 at 2847w2d with twins who presents with headache and visual changes. She has known preeclampsia and was seen in the office yesterday where she had elevated BP and was told to come to MAU. She did not come yesterday and states the headache got worse over night and now she is seeing spots in her vision that started last night. She rates the headache a 10/10. She denies any abdominal pain, vaginal bleeding or leaking of fluid. Reports good fetal movement with both babies.  OB History    Gravida Para Term Preterm AB Living   1             SAB TAB Ectopic Multiple Live Births                  Past Medical History:  Diagnosis Date  . Anxiety   . BV (bacterial vaginosis)   . Headache    migraines  . Hx of chlamydia infection 10/17/2016    Past Surgical History:  Procedure Laterality Date  . NO PAST SURGERIES      Family History  Problem Relation Age of Onset  . COPD Mother   . Diabetes Mother   . Mental illness Mother   . Alcohol abuse Mother   . Drug abuse Mother   . Diabetes Father   . Hypertension Father   . Alcohol abuse Father   . Drug abuse Father     Social History   Tobacco Use  . Smoking status: Former Games developermoker  . Smokeless tobacco: Never Used  Substance Use Topics  . Alcohol use: No  . Drug use: No   Allergies: No Known Allergies  Medications Prior to Admission  Medication Sig Dispense Refill Last Dose  . amoxicillin-clavulanate (AUGMENTIN) 875-125 MG tablet Take 1 tablet by mouth every 12 (twelve) hours. 6 tablet 0 Taking  . Elastic Bandages & Supports (COMFORT FIT MATERNITY SUPP MED) MISC Wear daily when ambulating 1 each 0 Taking  . Ferrous Fumarate (HEMOCYTE - 106 MG FE) 324 (106 Fe) MG TABS tablet Take 1  tablet (106 mg of iron total) by mouth 2 (two) times daily. 30 tablet 2 Taking  . Prenatal Vit-Fe Phos-FA-Omega (VITAFOL GUMMIES) 3.33-0.333-34.8 MG CHEW Chew 1 tablet by mouth daily. 90 tablet 3 Taking    Review of Systems  Constitutional: Negative.  Negative for fatigue and fever.  HENT: Negative.   Eyes: Positive for visual disturbance.  Respiratory: Negative.  Negative for shortness of breath.   Cardiovascular: Negative.  Negative for chest pain.  Gastrointestinal: Negative.  Negative for abdominal pain, constipation, diarrhea, nausea and vomiting.  Genitourinary: Negative.  Negative for dysuria.  Neurological: Positive for headaches. Negative for dizziness.   Physical Exam   Blood pressure (!) 167/91, pulse 87, temperature 98.5 F (36.9 C), temperature source Oral, resp. rate 17, height 5\' 1"  (1.549 m), weight 246 lb (111.6 kg), last menstrual period 09/06/2016, SpO2 98 %, unknown if currently breastfeeding.  Physical Exam  Nursing note and vitals reviewed. Constitutional: She is oriented to person, place, and time. She appears well-developed and well-nourished. No distress.  HENT:  Head: Normocephalic.  Eyes: Pupils are equal, round, and reactive to light.  Cardiovascular: Normal rate, regular  rhythm and normal heart sounds.  Respiratory: Effort normal and breath sounds normal. No respiratory distress.  GI: Soft. Bowel sounds are normal. She exhibits no distension. There is no tenderness.  Neurological: She is alert and oriented to person, place, and time. She has normal reflexes. She displays normal reflexes.  Skin: Skin is warm and dry.  Psychiatric: She has a normal mood and affect. Her behavior is normal. Judgment and thought content normal.   Fetal Tracing:  Baby A: Baseline: 140 Variability: moderate Accels: 15x15 Decels: none  Baby B: Baseline: 135 Variability: moderate Accels: 15x15 Decels: none  Toco: occasional uc's  Vitals:   05/04/17 1704 05/04/17  1722 05/04/17 1731 05/04/17 1746  BP: (!) 174/91 (!) 165/103 (!) 178/101 (!) 167/91  Pulse:  89 90 87  Resp: 17     Temp: 98.5 F (36.9 C)     TempSrc: Oral     SpO2: 98%     Weight: 246 lb (111.6 kg)     Height: 5\' 1"  (1.549 m)       MAU Course  Procedures Results for orders placed or performed during the hospital encounter of 05/04/17 (from the past 24 hour(s))  Urinalysis, Routine w reflex microscopic     Status: Abnormal   Collection Time: 05/04/17  5:04 PM  Result Value Ref Range   Color, Urine YELLOW YELLOW   APPearance HAZY (A) CLEAR   Specific Gravity, Urine 1.024 1.005 - 1.030   pH 6.0 5.0 - 8.0   Glucose, UA NEGATIVE NEGATIVE mg/dL   Hgb urine dipstick NEGATIVE NEGATIVE   Bilirubin Urine NEGATIVE NEGATIVE   Ketones, ur NEGATIVE NEGATIVE mg/dL   Protein, ur >=161>=300 (A) NEGATIVE mg/dL   Nitrite NEGATIVE NEGATIVE   Leukocytes, UA TRACE (A) NEGATIVE   RBC / HPF 0-5 0 - 5 RBC/hpf   WBC, UA 6-30 0 - 5 WBC/hpf   Bacteria, UA RARE (A) NONE SEEN   Squamous Epithelial / LPF 6-30 (A) NONE SEEN   Mucus PRESENT   Protein / creatinine ratio, urine     Status: None (Preliminary result)   Collection Time: 05/04/17  5:04 PM  Result Value Ref Range   Creatinine, Urine 180.00 mg/dL   Total Protein, Urine PENDING mg/dL   Protein Creatinine Ratio PENDING 0.00 - 0.15 mg/mg[Cre]    MDM UA CBC, CMP, Protein/creat ratio IV LR Labetalol protocol ordered- 20mg ,  Dr. Jolayne Pantheronstant at bedside to assess patient- both babies vertex on u/s, will admit to labor and delivery for induction Assessment and Plan  Preeclampsia with severe features  Admit to birthing suites for IOL Report called to labor team Mag bolus and infusion ordered  Rolm BookbinderCaroline M Neill CNM 05/04/2017, 5:59 PM

## 2017-05-04 NOTE — MAU Note (Signed)
Pt reports she has a really bad headache since last pm. Reports she was supposed to return yesterday due to b/p issues

## 2017-05-04 NOTE — Anesthesia Pain Management Evaluation Note (Signed)
  CRNA Pain Management Visit Note  Patient: Jackie Chavez, 21 y.o., female  "Hello I am a member of the anesthesia team at [facility]. We have an anesthesia team available at all times to provide care throughout the hospital, including epidural management and anesthesia for C-section. I don't know your plan for the delivery whether it a natural birth, water birth, IV sedation, nitrous supplementation, doula or epidural, but we want to meet your pain goals."   1.Was your pain managed to your expectations on prior hospitalizations?   No prior hospitalizations  2.What is your expectation for pain management during this hospitalization?     Epidural, IV pain meds and Nitrous Oxide  3.How can we help you reach that goal? Be available  Record the patient's initial score and the patient's pain goal.   Pain: 7  Pain Goal: 5 [facility] wants you to be able to say your pain was always managed very well.  [facility] PointMERRITT,Aceyn Kathol 05/04/2017

## 2017-05-04 NOTE — Consult Note (Signed)
Asked by Dr.Constant to provide prenatal consultation for 21 y.o. G1 who is now 34.[redacted] weeks EGA with dichorionic twins, whose pregnancy is also complicated by gestational hypertension..  She is being induced for HTN and was treated with betamethasone during a previous admission.  Discussed usual expectations for preterm infants at [redacted] weeks gestation, including possible needs for DR resuscitation, respiratory support, IV access, and temp support. Projected possible length of stay in NICU until 36 - [redacted] wks EGA.  Discussed advantages of feeding with mother's milk.  She plans to pump postnatally.  Patient was attentive, had appropriate questions, and was appreciative of my input.  Thank you for consulting Neonatology.  Total time 25 minutes.  JWimmer, MD

## 2017-05-05 ENCOUNTER — Encounter (HOSPITAL_COMMUNITY): Payer: Self-pay | Admitting: General Practice

## 2017-05-05 ENCOUNTER — Inpatient Hospital Stay (HOSPITAL_COMMUNITY): Payer: Medicaid Other | Admitting: Anesthesiology

## 2017-05-05 ENCOUNTER — Encounter (HOSPITAL_COMMUNITY)
Admission: AD | Disposition: A | Discharge status: Home / Self Care | Payer: Self-pay | Source: Ambulatory Visit | Attending: Obstetrics and Gynecology

## 2017-05-05 DIAGNOSIS — O1490 Unspecified pre-eclampsia, unspecified trimester: Secondary | ICD-10-CM

## 2017-05-05 DIAGNOSIS — O99824 Streptococcus B carrier state complicating childbirth: Secondary | ICD-10-CM

## 2017-05-05 DIAGNOSIS — O30043 Twin pregnancy, dichorionic/diamniotic, third trimester: Secondary | ICD-10-CM

## 2017-05-05 DIAGNOSIS — O1414 Severe pre-eclampsia complicating childbirth: Secondary | ICD-10-CM

## 2017-05-05 DIAGNOSIS — Z3A34 34 weeks gestation of pregnancy: Secondary | ICD-10-CM

## 2017-05-05 LAB — CBC
HCT: 28.2 % — ABNORMAL LOW (ref 36.0–46.0)
HEMOGLOBIN: 8.8 g/dL — AB (ref 12.0–15.0)
MCH: 26.7 pg (ref 26.0–34.0)
MCHC: 31.2 g/dL (ref 30.0–36.0)
MCV: 85.7 fL (ref 78.0–100.0)
Platelets: 207 10*3/uL (ref 150–400)
RBC: 3.29 MIL/uL — AB (ref 3.87–5.11)
RDW: 15.2 % (ref 11.5–15.5)
WBC: 14.4 10*3/uL — ABNORMAL HIGH (ref 4.0–10.5)

## 2017-05-05 LAB — RPR: RPR: NONREACTIVE

## 2017-05-05 SURGERY — Surgical Case
Anesthesia: General

## 2017-05-05 MED ORDER — HYDROMORPHONE 1 MG/ML IV SOLN
INTRAVENOUS | Status: DC
  Filled 2017-05-05: qty 25

## 2017-05-05 MED ORDER — DEXAMETHASONE SODIUM PHOSPHATE 10 MG/ML IJ SOLN
INTRAMUSCULAR | Status: AC
  Filled 2017-05-05: qty 1

## 2017-05-05 MED ORDER — NALOXONE HCL 0.4 MG/ML IJ SOLN: 0.4000 mg | INTRAMUSCULAR | Status: DC | PRN

## 2017-05-05 MED ORDER — LACTATED RINGERS IV SOLN
INTRAVENOUS | Status: DC
  Administered 2017-05-05 (×2): via INTRAVENOUS

## 2017-05-05 MED ORDER — MIDAZOLAM HCL 2 MG/2ML IJ SOLN
INTRAMUSCULAR | Status: AC
  Filled 2017-05-05: qty 2

## 2017-05-05 MED ORDER — LIDOCAINE HCL (CARDIAC) 20 MG/ML IV SOLN
INTRAVENOUS | Status: AC
  Filled 2017-05-05: qty 5

## 2017-05-05 MED ORDER — SODIUM CHLORIDE 0.9% FLUSH
INTRAVENOUS | Status: AC
  Filled 2017-05-05: qty 12

## 2017-05-05 MED ORDER — ONDANSETRON HCL 4 MG/2ML IJ SOLN
INTRAMUSCULAR | Status: AC
  Filled 2017-05-05: qty 2

## 2017-05-05 MED ORDER — SODIUM CHLORIDE 0.9 % IR SOLN
Status: DC | PRN
  Administered 2017-05-05: 1000 mL

## 2017-05-05 MED ORDER — LACTATED RINGERS IV SOLN
INTRAVENOUS | Status: DC | PRN
Start: 2017-05-05 — End: 2017-05-05
  Administered 2017-05-05 (×2): via INTRAVENOUS

## 2017-05-05 MED ORDER — TETANUS-DIPHTH-ACELL PERTUSSIS 5-2.5-18.5 LF-MCG/0.5 IM SUSP: 0.5000 mL | Freq: Once | INTRAMUSCULAR | Status: DC

## 2017-05-05 MED ORDER — MEPERIDINE HCL 25 MG/ML IJ SOLN
INTRAMUSCULAR | Status: AC
  Filled 2017-05-05: qty 1

## 2017-05-05 MED ORDER — DEXAMETHASONE SODIUM PHOSPHATE 10 MG/ML IJ SOLN
INTRAMUSCULAR | Status: DC | PRN
  Administered 2017-05-05: 10 mg via INTRAVENOUS

## 2017-05-05 MED ORDER — OXYCODONE HCL 5 MG/5ML PO SOLN: 5.0000 mg | Freq: Once | ORAL | Status: DC | PRN

## 2017-05-05 MED ORDER — FENTANYL CITRATE (PF) 250 MCG/5ML IJ SOLN
INTRAMUSCULAR | Status: AC
  Filled 2017-05-05: qty 5

## 2017-05-05 MED ORDER — PROPOFOL 10 MG/ML IV BOLUS
INTRAVENOUS | Status: AC
  Filled 2017-05-05: qty 40

## 2017-05-05 MED ORDER — SIMETHICONE 80 MG PO CHEW
80.0000 mg | CHEWABLE_TABLET | Freq: Three times a day (TID) | ORAL | Status: DC
  Administered 2017-05-05 – 2017-05-08 (×9): 80 mg via ORAL
  Filled 2017-05-05 (×9): qty 1

## 2017-05-05 MED ORDER — OXYCODONE-ACETAMINOPHEN 5-325 MG PO TABS
1.0000 | ORAL_TABLET | ORAL | Status: DC | PRN
  Administered 2017-05-05 (×2): 2 via ORAL
  Administered 2017-05-05 – 2017-05-06 (×3): 1 via ORAL
  Administered 2017-05-06 – 2017-05-07 (×2): 2 via ORAL
  Administered 2017-05-07: 1 via ORAL
  Administered 2017-05-07: 2 via ORAL
  Filled 2017-05-05: qty 1
  Filled 2017-05-05 (×4): qty 2
  Filled 2017-05-05: qty 1
  Filled 2017-05-05 (×2): qty 2
  Filled 2017-05-05: qty 1
  Filled 2017-05-05: qty 2

## 2017-05-05 MED ORDER — SUCCINYLCHOLINE CHLORIDE 20 MG/ML IJ SOLN
INTRAMUSCULAR | Status: DC | PRN
  Administered 2017-05-05: 120 mg via INTRAVENOUS

## 2017-05-05 MED ORDER — PROMETHAZINE HCL 25 MG/ML IJ SOLN: 6.2500 mg | INTRAMUSCULAR | Status: DC | PRN

## 2017-05-05 MED ORDER — HYDROMORPHONE HCL 1 MG/ML IJ SOLN
0.2500 mg | INTRAMUSCULAR | Status: DC | PRN
  Administered 2017-05-05 (×2): 0.5 mg via INTRAVENOUS

## 2017-05-05 MED ORDER — SIMETHICONE 80 MG PO CHEW: 80.0000 mg | CHEWABLE_TABLET | ORAL | Status: DC | PRN

## 2017-05-05 MED ORDER — MENTHOL 3 MG MT LOZG: 1.0000 | LOZENGE | OROMUCOSAL | Status: DC | PRN

## 2017-05-05 MED ORDER — SCOPOLAMINE 1 MG/3DAYS TD PT72
MEDICATED_PATCH | TRANSDERMAL | Status: DC | PRN
  Administered 2017-05-05: 1 via TRANSDERMAL

## 2017-05-05 MED ORDER — DIPHENHYDRAMINE HCL 50 MG/ML IJ SOLN: 12.5000 mg | Freq: Four times a day (QID) | INTRAMUSCULAR | Status: DC | PRN

## 2017-05-05 MED ORDER — OXYTOCIN 10 UNIT/ML IJ SOLN
INTRAMUSCULAR | Status: AC
  Filled 2017-05-05: qty 4

## 2017-05-05 MED ORDER — OXYTOCIN 10 UNIT/ML IJ SOLN
INTRAMUSCULAR | Status: DC | PRN
  Administered 2017-05-05: 40 [IU] via INTRAMUSCULAR

## 2017-05-05 MED ORDER — MIDAZOLAM HCL 2 MG/2ML IJ SOLN
INTRAMUSCULAR | Status: DC | PRN
  Administered 2017-05-05: 2 mg via INTRAVENOUS

## 2017-05-05 MED ORDER — FENTANYL CITRATE (PF) 100 MCG/2ML IJ SOLN
INTRAMUSCULAR | Status: AC
  Filled 2017-05-05: qty 2

## 2017-05-05 MED ORDER — PROPOFOL 10 MG/ML IV BOLUS
INTRAVENOUS | Status: DC | PRN
Start: 2017-05-05 — End: 2017-05-05
  Administered 2017-05-05: 200 mg via INTRAVENOUS
  Administered 2017-05-05: 50 mg via INTRAVENOUS

## 2017-05-05 MED ORDER — SODIUM CHLORIDE 0.9% FLUSH: 9.0000 mL | INTRAVENOUS | Status: DC | PRN

## 2017-05-05 MED ORDER — OXYTOCIN 40 UNITS IN LACTATED RINGERS INFUSION - SIMPLE MED: 2.5000 [IU]/h | INTRAVENOUS | Status: AC

## 2017-05-05 MED ORDER — ONDANSETRON HCL 4 MG/2ML IJ SOLN: 4.0000 mg | Freq: Four times a day (QID) | INTRAMUSCULAR | Status: DC | PRN

## 2017-05-05 MED ORDER — SUCCINYLCHOLINE CHLORIDE 200 MG/10ML IV SOSY
PREFILLED_SYRINGE | INTRAVENOUS | Status: AC
  Filled 2017-05-05: qty 10

## 2017-05-05 MED ORDER — CEFAZOLIN SODIUM-DEXTROSE 2-3 GM-%(50ML) IV SOLR
INTRAVENOUS | Status: DC | PRN
  Administered 2017-05-05: 2 g via INTRAVENOUS

## 2017-05-05 MED ORDER — HYDROMORPHONE HCL 1 MG/ML IJ SOLN
INTRAMUSCULAR | Status: AC
  Filled 2017-05-05: qty 0.5

## 2017-05-05 MED ORDER — OXYCODONE HCL 5 MG PO TABS: 5.0000 mg | ORAL_TABLET | Freq: Once | ORAL | Status: DC | PRN

## 2017-05-05 MED ORDER — DIPHENHYDRAMINE HCL 12.5 MG/5ML PO ELIX: 12.5000 mg | ORAL_SOLUTION | Freq: Four times a day (QID) | ORAL | Status: DC | PRN

## 2017-05-05 MED ORDER — COCONUT OIL OIL: 1.0000 "application " | TOPICAL_OIL | Status: DC | PRN

## 2017-05-05 MED ORDER — SENNOSIDES-DOCUSATE SODIUM 8.6-50 MG PO TABS
2.0000 | ORAL_TABLET | ORAL | Status: DC
  Administered 2017-05-05 – 2017-05-08 (×3): 2 via ORAL
  Filled 2017-05-05 (×3): qty 2

## 2017-05-05 MED ORDER — DIBUCAINE 1 % RE OINT: 1.0000 "application " | TOPICAL_OINTMENT | RECTAL | Status: DC | PRN

## 2017-05-05 MED ORDER — WITCH HAZEL-GLYCERIN EX PADS: 1.0000 "application " | MEDICATED_PAD | CUTANEOUS | Status: DC | PRN

## 2017-05-05 MED ORDER — DIPHENHYDRAMINE HCL 25 MG PO CAPS: 25.0000 mg | ORAL_CAPSULE | Freq: Four times a day (QID) | ORAL | Status: DC | PRN

## 2017-05-05 MED ORDER — SCOPOLAMINE 1 MG/3DAYS TD PT72
MEDICATED_PATCH | TRANSDERMAL | Status: AC
  Filled 2017-05-05: qty 1

## 2017-05-05 MED ORDER — SODIUM CHLORIDE 0.9 % IV SOLN
INTRAVENOUS | Status: DC | PRN
  Administered 2017-05-05: 80 ug via INTRAVENOUS

## 2017-05-05 MED ORDER — ONDANSETRON HCL 4 MG/2ML IJ SOLN
INTRAMUSCULAR | Status: DC | PRN
  Administered 2017-05-05: 4 mg via INTRAVENOUS

## 2017-05-05 MED ORDER — PRENATAL MULTIVITAMIN CH
1.0000 | ORAL_TABLET | Freq: Every day | ORAL | Status: DC
  Administered 2017-05-05 – 2017-05-07 (×3): 1 via ORAL
  Filled 2017-05-05 (×3): qty 1

## 2017-05-05 MED ORDER — IBUPROFEN 600 MG PO TABS
600.0000 mg | ORAL_TABLET | Freq: Four times a day (QID) | ORAL | Status: DC | PRN
  Administered 2017-05-05 – 2017-05-07 (×6): 600 mg via ORAL
  Filled 2017-05-05 (×8): qty 1

## 2017-05-05 MED ORDER — HYDROMORPHONE HCL 1 MG/ML IJ SOLN
INTRAMUSCULAR | Status: AC
  Administered 2017-05-05: 0.5 mg
  Filled 2017-05-05: qty 0.5

## 2017-05-05 MED ORDER — SIMETHICONE 80 MG PO CHEW
80.0000 mg | CHEWABLE_TABLET | ORAL | Status: DC
  Administered 2017-05-05 – 2017-05-08 (×3): 80 mg via ORAL
  Filled 2017-05-05 (×3): qty 1

## 2017-05-05 MED ORDER — MEPERIDINE HCL 25 MG/ML IJ SOLN
6.2500 mg | INTRAMUSCULAR | Status: DC | PRN
  Administered 2017-05-05: 12.5 mg via INTRAVENOUS

## 2017-05-05 MED ORDER — FENTANYL CITRATE (PF) 100 MCG/2ML IJ SOLN
INTRAMUSCULAR | Status: DC | PRN
  Administered 2017-05-05: 250 ug via INTRAVENOUS
  Administered 2017-05-05 (×2): 50 ug via INTRAVENOUS

## 2017-05-05 SURGICAL SUPPLY — 32 items
APL SKNCLS STERI-STRIP NONHPOA (GAUZE/BANDAGES/DRESSINGS) ×1
BENZOIN TINCTURE PRP APPL 2/3 (GAUZE/BANDAGES/DRESSINGS) ×3 IMPLANT
BRR ADH 6X5 SEPRAFILM 1 SHT (MISCELLANEOUS)
CHLORAPREP W/TINT 26ML (MISCELLANEOUS) ×3 IMPLANT
CLAMP CORD UMBIL (MISCELLANEOUS) IMPLANT
CLOSURE WOUND 1/2 X4 (GAUZE/BANDAGES/DRESSINGS) ×1
CONTAINER PREFILL 10% NBF 15ML (MISCELLANEOUS) IMPLANT
DRSG OPSITE POSTOP 4X10 (GAUZE/BANDAGES/DRESSINGS) ×3 IMPLANT
ELECT REM PT RETURN 9FT ADLT (ELECTROSURGICAL) ×3
ELECTRODE REM PT RTRN 9FT ADLT (ELECTROSURGICAL) ×1 IMPLANT
EXTRACTOR VACUUM M CUP 4 TUBE (SUCTIONS) IMPLANT
EXTRACTOR VACUUM M CUP 4' TUBE (SUCTIONS)
GLOVE BIOGEL PI IND STRL 6.5 (GLOVE) ×1 IMPLANT
GLOVE BIOGEL PI IND STRL 7.0 (GLOVE) ×1 IMPLANT
GLOVE BIOGEL PI INDICATOR 6.5 (GLOVE) ×2
GLOVE BIOGEL PI INDICATOR 7.0 (GLOVE) ×2
GLOVE SURG SS PI 6.0 STRL IVOR (GLOVE) ×3 IMPLANT
GOWN STRL REUS W/TWL LRG LVL3 (GOWN DISPOSABLE) ×6 IMPLANT
KIT ABG SYR 3ML LUER SLIP (SYRINGE) IMPLANT
NEEDLE HYPO 25X5/8 SAFETYGLIDE (NEEDLE) IMPLANT
NS IRRIG 1000ML POUR BTL (IV SOLUTION) ×3 IMPLANT
PACK C SECTION WH (CUSTOM PROCEDURE TRAY) ×3 IMPLANT
PAD OB MATERNITY 4.3X12.25 (PERSONAL CARE ITEMS) ×3 IMPLANT
PENCIL SMOKE EVAC W/HOLSTER (ELECTROSURGICAL) ×3 IMPLANT
RTRCTR C-SECT PINK 25CM LRG (MISCELLANEOUS) IMPLANT
SEPRAFILM MEMBRANE 5X6 (MISCELLANEOUS) IMPLANT
STRIP CLOSURE SKIN 1/2X4 (GAUZE/BANDAGES/DRESSINGS) ×2 IMPLANT
SUT PLAIN 0 NONE (SUTURE) IMPLANT
SUT VIC AB 0 CT1 36 (SUTURE) ×12 IMPLANT
SUT VIC AB 4-0 KS 27 (SUTURE) ×3 IMPLANT
TOWEL OR 17X24 6PK STRL BLUE (TOWEL DISPOSABLE) ×3 IMPLANT
TRAY FOLEY BAG SILVER LF 14FR (SET/KITS/TRAYS/PACK) ×3 IMPLANT

## 2017-05-05 NOTE — Plan of Care (Signed)
POC updated

## 2017-05-05 NOTE — Progress Notes (Signed)
Pt ambulated to BR with assistance and pericarp preformed.   Pt doing well and able to ambulate without assist.  Pt escorted to NICU by RN.

## 2017-05-05 NOTE — Anesthesia Preprocedure Evaluation (Signed)
Anesthesia Evaluation  Patient identified by MRN, date of birth, ID band Patient awake    Reviewed: Allergy & Precautions, NPO status , Patient's Chart, lab work & pertinent test results  Airway Mallampati: II  TM Distance: >3 FB Neck ROM: Full    Dental no notable dental hx.    Pulmonary neg pulmonary ROS, former smoker,    Pulmonary exam normal breath sounds clear to auscultation       Cardiovascular negative cardio ROS Normal cardiovascular exam Rhythm:Regular Rate:Normal     Neuro/Psych Anxiety negative neurological ROS  negative psych ROS   GI/Hepatic negative GI ROS, Neg liver ROS,   Endo/Other  negative endocrine ROSMorbid obesity  Renal/GU negative Renal ROS  negative genitourinary   Musculoskeletal negative musculoskeletal ROS (+)   Abdominal   Peds negative pediatric ROS (+)  Hematology negative hematology ROS (+)   Anesthesia Other Findings   Reproductive/Obstetrics negative OB ROS (+) Pregnancy                             Anesthesia Physical Anesthesia Plan  ASA: III and emergent  Anesthesia Plan: General   Post-op Pain Management:    Induction: Intravenous, Rapid sequence and Cricoid pressure planned  PONV Risk Score and Plan: 3 and Treatment may vary due to age or medical condition and Ondansetron  Airway Management Planned: Oral ETT  Additional Equipment:   Intra-op Plan:   Post-operative Plan: Extubation in OR  Informed Consent: I have reviewed the patients History and Physical, chart, labs and discussed the procedure including the risks, benefits and alternatives for the proposed anesthesia with the patient or authorized representative who has indicated his/her understanding and acceptance.   Dental advisory given  Plan Discussed with: CRNA  Anesthesia Plan Comments:         Anesthesia Quick Evaluation

## 2017-05-05 NOTE — Anesthesia Procedure Notes (Signed)
Procedure Name: Intubation Date/Time: 05/05/2017 12:46 AM Performed by: Genevie Ann, CRNA Pre-anesthesia Checklist: Patient identified, Timeout performed, Emergency Drugs available, Suction available and Patient being monitored Patient Re-evaluated:Patient Re-evaluated prior to induction Oxygen Delivery Method: Circle system utilized Induction Type: IV induction and Cricoid Pressure applied Laryngoscope Size: Mac and 3 Grade View: Grade I Tube type: Oral Tube size: 7.0 mm Number of attempts: 1 Placement Confirmation: ETT inserted through vocal cords under direct vision,  positive ETCO2 and breath sounds checked- equal and bilateral Secured at: 21 cm

## 2017-05-05 NOTE — Anesthesia Postprocedure Evaluation (Signed)
Anesthesia Post Note  Patient: Jackie Chavez  Procedure(s) Performed: CESAREAN SECTION (N/A )     Patient location during evaluation: PACU Anesthesia Type: General Level of consciousness: awake and alert Pain management: pain level controlled Vital Signs Assessment: post-procedure vital signs reviewed and stable Respiratory status: spontaneous breathing, nonlabored ventilation and respiratory function stable Cardiovascular status: blood pressure returned to baseline and stable Postop Assessment: no apparent nausea or vomiting Anesthetic complications: no    Last Vitals:  Vitals:   05/05/17 0413 05/05/17 0500  BP: (!) 150/96 (!) 160/94  Pulse: 98 93  Resp: 18 18  Temp: 36.6 C (!) 36.4 C  SpO2: 100% 100%    Last Pain:  Vitals:   05/05/17 0511  TempSrc:   PainSc: 8    Pain Goal: Patients Stated Pain Goal: 2 (05/05/17 0315)               Lowella CurbWarren Ray Haidar Muse

## 2017-05-05 NOTE — Transfer of Care (Signed)
Immediate Anesthesia Transfer of Care Note  Patient: Jackie Chavez  Procedure(s) Performed: CESAREAN SECTION (N/A )  Patient Location: PACU  Anesthesia Type:General  Level of Consciousness: awake, alert  and oriented  Airway & Oxygen Therapy: Patient Spontanous Breathing and Patient connected to nasal cannula oxygen  Post-op Assessment: Report given to RN and Post -op Vital signs reviewed and stable  Post vital signs: Reviewed and stable  Last Vitals:  Vitals:   05/04/17 2307 05/05/17 0004  BP: (!) 155/94 (!) 156/79  Pulse: 97 96  Resp:    Temp:  36.8 C  SpO2:      Last Pain:  Vitals:   05/05/17 0004  TempSrc: Oral  PainSc: Asleep         Complications: No apparent anesthesia complications

## 2017-05-05 NOTE — Op Note (Signed)
[name] PROCEDURE DATE: 05/04/2017 - 05/05/2017  PREOPERATIVE DIAGNOSIS: Intrauterine pregnancy at  5256w3d weeks gestation, Di-Di twins with severe preeclampsia; cord prolapse  POSTOPERATIVE DIAGNOSIS: The same  PROCEDURE:     Cesarean Section  SURGEON:  Dr. Catalina AntiguaPeggy Shirlette Scarber  ASSISTANT: Dr. Rachelle HoraMoss  INDICATIONS: Jackie Chavez is a 21 y.o. G1P0 at 6656w3d with Di-Di twins with severe preeclampsia scheduled for cesarean section secondary to cord prolapse.  The risks of cesarean section discussed with the patient included but were not limited to: bleeding which may require transfusion or reoperation; infection which may require antibiotics; injury to bowel, bladder, ureters or other surrounding organs; injury to the fetus; need for additional procedures including hysterectomy in the event of a life-threatening hemorrhage; placental abnormalities wth subsequent pregnancies, incisional problems, thromboembolic phenomenon and other postoperative/anesthesia complications. The patient concurred with the proposed plan, giving informed written consent for the procedure.    FINDINGS:  Viable female infant in cephalic presentation x 2.  Apgars 7 and 9 x 2.  Clear amniotic fluid x 2 .  Intact placenta, three vessel cord x 2 with true knot in cord of baby B.  Normal uterus, fallopian tubes and ovaries bilaterally.  ANESTHESIA:    Spinal INTRAVENOUS FLUIDS:1800 ml ESTIMATED BLOOD LOSS: 1128 ml URINE OUTPUT:  100 ml SPECIMENS: Placenta sent to pathology COMPLICATIONS: None immediate  PROCEDURE IN DETAIL:  The patient received intravenous antibiotics and had sequential compression devices applied to her lower extremities while in the preoperative area.  She was then taken to the operating room where anesthesia was induced and was found to be adequate. A foley catheter was placed into her bladder and attached to Dimond Crotty gravity. She was then placed in a dorsal supine position with a leftward tilt, and  prepped and draped in a sterile manner. After an adequate timeout was performed, a Pfannenstiel skin incision was made with scalpel and carried through to the underlying layer of fascia. The fascia was incised in the midline and this incision was extended bilaterally using the Mayo scissors. Kocher clamps were applied to the superior aspect of the fascial incision and the underlying rectus muscles were dissected off bluntly. A similar process was carried out on the inferior aspect of the facial incision. The rectus muscles were separated in the midline bluntly and the peritoneum was entered bluntly. The Alexis self-retaining retractor was introduced into the abdominal cavity. Attention was turned to the lower uterine segment where a transverse hysterotomy was made with a scalpel and extended bilaterally bluntly. The infants were successfully delivered with delayed cord clamping for 1 minute for both infants. The cords were clamped and cut and infants were handed over to awaiting neonatology team. Uterine massage was then administered and the placenta delivered intact with three-vessel cord. The uterus was cleared of clot and debris.  The hysterotomy was closed with 0 Vicryl in a running locked fashion, and an imbricating layer was also placed with a 0 Vicryl. Overall, excellent hemostasis was noted. The pelvis copiously irrigated and cleared of all clot and debris. Hemostasis was confirmed on all surfaces.  The peritoneum and the muscles were reapproximated using 0 vicryl interrupted stitches. The fascia was then closed using 0 Vicryl in a running fashion.  The skin was closed in a subcuticular fashion using 3.0 Vicryl. The patient tolerated the procedure well. Sponge, lap, instrument and needle counts were correct x 2. She was taken to the recovery room in stable condition.    Ineze Serrao ConstantMD  05/05/2017  1:20 AM

## 2017-05-05 NOTE — Lactation Note (Signed)
This note was copied from a baby's chart. Lactation Consultation Note  Patient Name: Jackie Chavez HilaJaximica Hritz Today's Date: 05/05/2017 Reason for consult: Initial assessment;Late-preterm 34-36.6wks;Multiple gestation;1st time breastfeeding;Primapara;Infant < 6lbs  Twins in NICU.  [facility] RN assisted mom to pump x 2 today and hand express. LC helped to transfer milk to colostrum collectors so dad   Could take them to NICU.  Mom seemed very excited she got some colostrum.  LC praised her.  Mother informed of post-discharge support and given phone number to the lactation department, including services for phone call assistance; out-patient appointments; and breastfeeding support group. List of other breastfeeding resources in the community given in the handout. Encouraged mother to call for problems or concerns related to breastfeeding.   Maternal Data Has patient been taught Hand Expression?: Yes([facility] RN assisted mom/ more success with hand expressing than the pump ) Does the patient have breastfeeding experience prior to this delivery?: No  Feeding Feeding Type: Donor Breast Milk Length of feed: 30 min  LATCH Score                   Interventions Interventions: Breast feeding basics reviewed;Expressed milk;DEBP  Lactation Tools Discussed/Used Tools: Pump Breast pump type: Double-Electric Breast Pump Pump Review: Setup, frequency, and cleaning;Milk Storage(was set up by the RB this am and mom pumped x 1 ) Initiated by:: LC reviewed how to clean pump pieces and washed them for mom  Date initiated:: 05/05/17   Consult Status Consult Status: Follow-up Date: 05/05/17 Follow-up type: In-patient    Matilde SprangMargaret Ann Ever Halberg 05/05/2017, 3:49 PM
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
## 2017-05-05 NOTE — Plan of Care (Signed)
POC discussed with pt

## 2017-05-05 NOTE — Progress Notes (Signed)
RN called Dr. Hyacinth MeekerMiller about patient pain level. Patient rated her pain at 8 and had received 0.5mg  of Dilaudid x 3. Dr. Hyacinth MeekerMiller was ok for RN to transfer pt to  Room 305

## 2017-05-06 ENCOUNTER — Other Ambulatory Visit: Payer: Self-pay

## 2017-05-06 ENCOUNTER — Other Ambulatory Visit: Payer: Self-pay | Admitting: Obstetrics and Gynecology

## 2017-05-06 NOTE — Progress Notes (Signed)
Subjective: Postpartum Day 1: Cesarean Delivery Patient reports some pain but pain medication helps. Breast pumping. Tolerating diet. Up to restroom voiding without problems  Objective: Vital signs in last 24 hours: Temp:  [97.6 F (36.4 C)-97.8 F (36.6 C)] 97.7 F (36.5 C) (12/17 0450) Pulse Rate:  [83-95] 84 (12/17 0450) Resp:  [16-19] 18 (12/17 0450) BP: (127-172)/(57-87) 131/85 (12/17 0450) SpO2:  [90 %-100 %] 100 % (12/17 0450) Weight:  [103.6 kg (228 lb 8 oz)] 103.6 kg (228 lb 8 oz) (12/17 0450)  Physical Exam:  General: alert Lochia: appropriate Uterine Fundus: firm Incision: drsg intact DVT Evaluation: No evidence of DVT seen on physical exam.  Recent Labs    05/04/17 1735 05/05/17 0555  HGB 9.7* 8.8*  HCT 30.2* 28.2*    Assessment/Plan: Status post Cesarean section. Doing well postoperatively.  Continue current care.  Hermina StaggersMichael L Koryn Charlot 05/06/2017, 9:14 AM

## 2017-05-06 NOTE — Lactation Note (Addendum)
This note was copied from a baby's chart. Lactation Consultation Note  Patient Name: Jackie PolingGirlB Bahja Radu ZOXWR'UToday's Date: 05/06/2017 Reason for consult: Multiple gestation;Late-preterm 34-36.6wks;Primapara;1st time breastfeeding;NICU baby;Other (Comment)(Lactation induction, DEBP , hand expressing )  Baby is 8638 hours old, Twin B  Per mom has only pumped x 2 in the last 24 hours and has hand expressed with better success.  LC reviewed supply and demand and the importance of consistent pumping around the clock.  Hand expressing before and after the pumping.  LC reviewed set up of the DEBP and settings.   LC faxed a DEBP WIC loaner 12/17      Maternal Data Has patient been taught Hand Expression?: Yes Does the patient have breastfeeding experience prior to this delivery?: No  Feeding Feeding Type: Donor Breast Milk Length of feed: 30 min  LATCH Score                   Interventions Interventions: Breast feeding basics reviewed;DEBP  Lactation Tools Discussed/Used Tools: Pump Breast pump type: Double-Electric Breast Pump WIC Program: No(desires to be signed up/ LC faxed  form ) Pump Review: Setup, frequency, and cleaning Initiated by:: LC reviewed /  Date initiated:: 05/06/17   Consult Status Consult Status: Follow-up Date: 05/07/17 Follow-up type: In-patient    Matilde SprangMargaret Ann Ramir Malerba 05/06/2017, 3:16 PM

## 2017-05-07 ENCOUNTER — Encounter: Payer: Medicaid Other | Admitting: Obstetrics & Gynecology

## 2017-05-07 MED ORDER — NIFEDIPINE 10 MG PO CAPS
10.0000 mg | ORAL_CAPSULE | Freq: Once | ORAL | Status: AC
  Administered 2017-05-07: 10 mg via ORAL
  Filled 2017-05-07: qty 1

## 2017-05-07 MED ORDER — AMLODIPINE BESYLATE 10 MG PO TABS
10.0000 mg | ORAL_TABLET | Freq: Every day | ORAL | Status: DC
  Administered 2017-05-07 – 2017-05-08 (×2): 10 mg via ORAL
  Filled 2017-05-07 (×2): qty 1

## 2017-05-07 NOTE — Progress Notes (Signed)
Subjective: Postpartum Day 2: Cesarean Delivery/SPEC  Pt without complaints this morning. Denies HA or visual changes. Tolerating diet. Pain controlled. Ambulating and voiding without problems.    Objective: Vital signs in last 24 hours: Temp:  [98.2 F (36.8 C)-98.7 F (37.1 C)] 98.5 F (36.9 C) (12/18 0800) Pulse Rate:  [74-88] 78 (12/18 0800) Resp:  [17-19] 18 (12/18 0800) BP: (138-157)/(70-95) 149/82 (12/18 0800) SpO2:  [100 %] 100 % (12/18 0800) Weight:  [104.7 kg (230 lb 12 oz)] 104.7 kg (230 lb 12 oz) (12/18 0615)  Physical Exam:  General: alert Lochia: appropriate Uterine Fundus: firm Incision: healing well DVT Evaluation: No evidence of DVT seen on physical exam.  Recent Labs    05/04/17 1735 05/05/17 0555  HGB 9.7* 8.8*  HCT 30.2* 28.2*    Assessment/Plan: Status post Cesarean section. Doing well postoperatively. Will start Norvasc Continue current care.  Hermina StaggersMichael L Stepehn Eckard 05/07/2017, 11:13 AM

## 2017-05-07 NOTE — Progress Notes (Signed)
Dr. Adrian BlackwaterStinson notified of BP of 160/89. Order for one time dose of Procardia given. Will continue to monitor. Carmelina DaneERRI L Percy Comp, RN

## 2017-05-08 MED ORDER — IBUPROFEN 600 MG PO TABS: 600.0000 mg | ORAL_TABLET | Freq: Four times a day (QID) | ORAL | 0 refills | Status: DC | PRN

## 2017-05-08 MED ORDER — MEASLES, MUMPS & RUBELLA VAC ~~LOC~~ INJ
0.5000 mL | INJECTION | Freq: Once | SUBCUTANEOUS | Status: AC
  Administered 2017-05-08: 0.5 mL via SUBCUTANEOUS
  Filled 2017-05-08: qty 0.5

## 2017-05-08 MED ORDER — SENNOSIDES-DOCUSATE SODIUM 8.6-50 MG PO TABS: 2.0000 | ORAL_TABLET | ORAL | 1 refills | Status: DC

## 2017-05-08 MED ORDER — AMLODIPINE BESYLATE 10 MG PO TABS: 10.0000 mg | ORAL_TABLET | Freq: Every day | ORAL | 1 refills | Status: DC

## 2017-05-08 MED ORDER — OXYCODONE-ACETAMINOPHEN 5-325 MG PO TABS: 1.0000 | ORAL_TABLET | ORAL | 0 refills | Status: DC | PRN

## 2017-05-08 NOTE — Discharge Instructions (Signed)

## 2017-05-08 NOTE — Progress Notes (Signed)
Pt discharged home with printed instructions. Pt verbalized an understanding. No concerns noted. Addisen Chappelle L Makenleigh Crownover, RN 

## 2017-05-08 NOTE — Discharge Summary (Signed)
OB Discharge Summary     Patient Name: Jackie DesanctisJaximica M Caratachea DOB: 02/28/1996 MRN: [id number]  Date of admission: 05/04/2017 Delivering MD:    Raul DelAlston, Girl A Cali [[id number]][[id number]]  CONSTANT, Landis GandyEGGY   Albano, GirlB Samreet [[id number]][[id number]]  CONSTANT, PEGGY   Date of discharge: 05/08/2017  Admitting diagnosis: 34 WKS, HIGH BP, HEADACHE Intrauterine pregnancy: 2371w3d     Secondary diagnosis:  Active Problems:   Dichorionic diamniotic twin pregnancy, antepartum   Preeclampsia, third trimester   Encounter for induction of labor  Additional problems: failed induction     Discharge diagnosis: Preterm Pregnancy Delivered and Preeclampsia (severe)                                                                                                Post partum procedures:none  Augmentation: AROM, Pitocin, Cytotec and Foley Balloon  Complications: cord prolapse  Hospital course:  Induction of Labor With Cesarean Section  21 y.o. yo G1P0102 at 4271w3d was admitted to the hospital 05/04/2017 for induction of labor. Patient had a labor course significant for cord prolapse. The patient went for cesarean section due to Cord Prolapse, and delivered a Viable infants    Raul Dellston, Girl A Aerika [[id number]][[id number]]  8:52 PM   Suzanna Obeylston, GirlB Lucille [[id number]][[id number]]  8:52 PM ,   Raul Dellston, Girl A Jessika [[id number]][[id number]]  05/04/2017   Suzanna Obeylston, GirlB Nichele [[id number]][[id number]]  05/04/2017   @Details  of operation can be found in separate operative Note.  Patient had an uncomplicated postpartum course. She is ambulating, tolerating a regular diet, passing flatus, and urinating well.  Patient is discharged home in stable condition on 05/08/17.                                    Physical exam  Vitals:   05/07/17 1708 05/07/17 2032 05/07/17 2252 05/08/17 0442  BP: (!) 160/89 (!) 154/79  128/61  Pulse: 97 (!) 114  100  Resp: 18 18 19 18   Temp: 97.7 F (36.5 C) 98.4 F (36.9 C)  98.6 F (37 C)  TempSrc: Oral Oral  Oral  SpO2:  99% 100%    Weight:    228 lb 12 oz (103.8 kg)  Height:       General: alert, cooperative and no distress Lochia: appropriate Uterine Fundus: firm Incision: Healing well with no significant drainage, No significant erythema, Dressing is clean, dry, and intact DVT Evaluation: No evidence of DVT seen on physical exam. Negative Homan's sign. No cords or calf tenderness. No significant calf/ankle edema. Labs: Lab Results  Component Value Date   WBC 14.4 (H) 05/05/2017   HGB 8.8 (L) 05/05/2017   HCT 28.2 (L) 05/05/2017   MCV 85.7 05/05/2017   PLT 207 05/05/2017   CMP Latest Ref Rng & Units 05/04/2017  Glucose 65 - 99 mg/dL 86  BUN 6 - 20 mg/dL 11  Creatinine 9.560.44 - 3.871.00 mg/dL 5.640.60  Sodium 332135 - 951145 mmol/L 135  Potassium 3.5 - 5.1 mmol/L 4.2  Chloride 101 - 111 mmol/L 106  CO2 22 - 32 mmol/L 19(L)  Calcium 8.9 - 10.3 mg/dL 1.6(X8.4(L)  Total Protein 6.5 - 8.1 g/dL 0.9(U5.8(L)  Total Bilirubin 0.3 - 1.2 mg/dL 0.3  Alkaline Phos 38 - 126 U/L 223(H)  AST 15 - 41 U/L 16  ALT 14 - 54 U/L 11(L)    Discharge instruction: per After Visit Summary and "Baby and Me Booklet".  After visit meds:  Allergies as of 05/08/2017   No Known Allergies     Medication List    STOP taking these medications   COMFORT FIT MATERNITY SUPP MED Misc     TAKE these medications   amLODipine 10 MG tablet Commonly known as:  NORVASC Take 1 tablet (10 mg total) by mouth daily.   Ferrous Fumarate 324 (106 Fe) MG Tabs tablet Commonly known as:  HEMOCYTE - 106 mg FE Take 1 tablet (106 mg of iron total) by mouth 2 (two) times daily.   ibuprofen 600 MG tablet Commonly known as:  ADVIL,MOTRIN Take 1 tablet (600 mg total) by mouth every 6 (six) hours as needed for mild pain.   oxyCODONE-acetaminophen 5-325 MG tablet Commonly known as:  PERCOCET/ROXICET Take 1 tablet by mouth every 4 (four) hours as needed for moderate pain.   senna-docusate 8.6-50 MG tablet Commonly known as:  Senokot-S Take 2 tablets by  mouth daily. Start taking on:  05/09/2017       Diet: routine diet  Activity: Advance as tolerated. Pelvic rest for 6 weeks.   Outpatient follow up:2 weeks Follow up Appt: Future Appointments  Date Time Provider Department Center  06/03/2017  1:30 PM Constant, Gigi GinPeggy, MD [facility] None   Follow up Visit:No Follow-up on file.  Postpartum contraception: Undecided  Newborn Data:   Raul Dellston, Girl A Christon [[id number]][[id number]]  Live born female  Birth Weight: 5 lb 4 oz (2380 g) APGAR: 7, 9  Newborn Delivery   Birth date/time:  05/05/2017 00:48:00 Delivery type:  C-Section, Low Transverse C-section categorization:  Primary      Suzanna Obeylston, GirlB Noa [[id number]][[id number]]  Live born female  Birth Weight: 4 lb 7.6 oz (2030 g) APGAR: 7, 9  Newborn Delivery   Birth date/time:  05/05/2017 00:50:00 Delivery type:  C-Section, Low Transverse C-section categorization:  Primary     Baby Feeding: Breast Disposition:NICU   05/08/2017 Levie HeritageJacob J Kirra Verga, DO

## 2017-05-09 ENCOUNTER — Encounter (HOSPITAL_COMMUNITY): Payer: Self-pay

## 2017-05-09 ENCOUNTER — Inpatient Hospital Stay (HOSPITAL_COMMUNITY)
Admission: AD | Admit: 2017-05-09 | Discharge: 2017-05-13 | DRG: 776 | Disposition: A | Discharge status: Home / Self Care | Payer: Medicaid Other | Source: Ambulatory Visit | Attending: Family Medicine | Admitting: Family Medicine

## 2017-05-09 ENCOUNTER — Inpatient Hospital Stay (HOSPITAL_COMMUNITY): Payer: Medicaid Other

## 2017-05-09 DIAGNOSIS — O1415 Severe pre-eclampsia, complicating the puerperium: Secondary | ICD-10-CM | POA: Diagnosis present

## 2017-05-09 DIAGNOSIS — A419 Sepsis, unspecified organism: Secondary | ICD-10-CM | POA: Diagnosis present

## 2017-05-09 DIAGNOSIS — Z98891 History of uterine scar from previous surgery: Secondary | ICD-10-CM

## 2017-05-09 DIAGNOSIS — O8604 Sepsis following an obstetrical procedure: Secondary | ICD-10-CM | POA: Diagnosis present

## 2017-05-09 DIAGNOSIS — O8601 Infection of obstetric surgical wound, superficial incisional site: Principal | ICD-10-CM | POA: Diagnosis present

## 2017-05-09 DIAGNOSIS — O9081 Anemia of the puerperium: Secondary | ICD-10-CM | POA: Diagnosis present

## 2017-05-09 DIAGNOSIS — O8600 Infection of obstetric surgical wound, unspecified: Secondary | ICD-10-CM | POA: Diagnosis present

## 2017-05-09 HISTORY — DX: Gestational (pregnancy-induced) hypertension without significant proteinuria, unspecified trimester: O13.9

## 2017-05-09 LAB — COMPREHENSIVE METABOLIC PANEL
ALBUMIN: 2.8 g/dL — AB (ref 3.5–5.0)
ALT: 21 U/L (ref 14–54)
ANION GAP: 13 (ref 5–15)
AST: 24 U/L (ref 15–41)
Alkaline Phosphatase: 148 U/L — ABNORMAL HIGH (ref 38–126)
BILIRUBIN TOTAL: 0.6 mg/dL (ref 0.3–1.2)
BUN: 14 mg/dL (ref 6–20)
CHLORIDE: 106 mmol/L (ref 101–111)
CO2: 19 mmol/L — AB (ref 22–32)
Calcium: 8.6 mg/dL — ABNORMAL LOW (ref 8.9–10.3)
Creatinine, Ser: 0.67 mg/dL (ref 0.44–1.00)
GFR calc Af Amer: >60 mL/min (ref >60)
GFR calc non Af Amer: >60 mL/min (ref >60)
GLUCOSE: 112 mg/dL — AB (ref 65–99)
POTASSIUM: 4.4 mmol/L (ref 3.5–5.1)
SODIUM: 138 mmol/L (ref 135–145)
Total Protein: 6.3 g/dL — ABNORMAL LOW (ref 6.5–8.1)

## 2017-05-09 LAB — URINALYSIS, ROUTINE W REFLEX MICROSCOPIC
BACTERIA UA: NONE SEEN
Bilirubin Urine: NEGATIVE
Glucose, UA: NEGATIVE mg/dL
Hgb urine dipstick: NEGATIVE
Ketones, ur: NEGATIVE mg/dL
Leukocytes, UA: NEGATIVE
Nitrite: NEGATIVE
PROTEIN: 100 mg/dL — AB
SPECIFIC GRAVITY, URINE: 1.015 (ref 1.005–1.030)
SQUAMOUS EPITHELIAL / LPF: NONE SEEN
pH: 7 (ref 5.0–8.0)

## 2017-05-09 LAB — INFLUENZA PANEL BY PCR (TYPE A & B)
INFLAPCR: NEGATIVE
INFLBPCR: NEGATIVE

## 2017-05-09 LAB — APTT: APTT: 27 s (ref 24–36)

## 2017-05-09 LAB — CBC WITH DIFFERENTIAL/PLATELET
BASOS ABS: 0 10*3/uL (ref 0.0–0.1)
BASOS PCT: 0 %
EOS ABS: 0.1 10*3/uL (ref 0.0–0.7)
Eosinophils Relative: 1 %
HEMATOCRIT: 27 % — AB (ref 36.0–46.0)
Hemoglobin: 8.5 g/dL — ABNORMAL LOW (ref 12.0–15.0)
Lymphocytes Relative: 8 %
Lymphs Abs: 1 10*3/uL (ref 0.7–4.0)
MCH: 27.1 pg (ref 26.0–34.0)
MCHC: 31.5 g/dL (ref 30.0–36.0)
MCV: 86 fL (ref 78.0–100.0)
MONO ABS: 0.8 10*3/uL (ref 0.1–1.0)
MONOS PCT: 6 %
NEUTROS ABS: 11.3 10*3/uL — AB (ref 1.7–7.7)
NEUTROS PCT: 85 %
Platelets: 256 10*3/uL (ref 150–400)
RBC: 3.14 MIL/uL — ABNORMAL LOW (ref 3.87–5.11)
RDW: 16.1 % — AB (ref 11.5–15.5)
WBC: 13.3 10*3/uL — ABNORMAL HIGH (ref 4.0–10.5)

## 2017-05-09 LAB — PROTIME-INR
INR: 1
PROTHROMBIN TIME: 13.1 s (ref 11.4–15.2)

## 2017-05-09 LAB — LACTIC ACID, PLASMA: Lactic Acid, Venous: 1.7 mmol/L (ref 0.5–1.9)

## 2017-05-09 MED ORDER — MAGNESIUM SULFATE 40 G IN LACTATED RINGERS - SIMPLE
2.0000 g/h | INTRAVENOUS | Status: DC
  Administered 2017-05-09: 4 g/h via INTRAVENOUS
  Administered 2017-05-10 – 2017-05-11 (×2): 2 g/h via INTRAVENOUS
  Filled 2017-05-09: qty 40
  Filled 2017-05-09: qty 500
  Filled 2017-05-09: qty 40

## 2017-05-09 MED ORDER — LABETALOL HCL 5 MG/ML IV SOLN
20.0000 mg | INTRAVENOUS | Status: DC | PRN
  Administered 2017-05-09: 20 mg via INTRAVENOUS

## 2017-05-09 MED ORDER — SODIUM CHLORIDE 0.9 % IV SOLN
2000.0000 mg | Freq: Once | INTRAVENOUS | Status: AC
  Administered 2017-05-10: 2000 mg via INTRAVENOUS
  Filled 2017-05-09: qty 2000

## 2017-05-09 MED ORDER — ACETAMINOPHEN 500 MG PO TABS
1000.0000 mg | ORAL_TABLET | Freq: Once | ORAL | Status: AC
  Administered 2017-05-09: 1000 mg via ORAL
  Filled 2017-05-09: qty 2

## 2017-05-09 MED ORDER — LABETALOL HCL 5 MG/ML IV SOLN
INTRAVENOUS | Status: AC
  Filled 2017-05-09: qty 4

## 2017-05-09 MED ORDER — MAGNESIUM SULFATE BOLUS VIA INFUSION
4.0000 g | Freq: Once | INTRAVENOUS | Status: AC
  Administered 2017-05-09: 4 g via INTRAVENOUS
  Filled 2017-05-09: qty 500

## 2017-05-09 MED ORDER — IOPAMIDOL (ISOVUE-300) INJECTION 61%
30.0000 mL | INTRAVENOUS | Status: AC
  Administered 2017-05-09: 30 mL via ORAL

## 2017-05-09 MED ORDER — PIPERACILLIN-TAZOBACTAM 3.375 G IVPB
3.3750 g | Freq: Three times a day (TID) | INTRAVENOUS | Status: DC
  Administered 2017-05-09 – 2017-05-13 (×10): 3.375 g via INTRAVENOUS
  Filled 2017-05-09 (×13): qty 50

## 2017-05-09 MED ORDER — HYDROMORPHONE HCL 1 MG/ML IJ SOLN
0.5000 mg | INTRAMUSCULAR | Status: DC | PRN
Start: 2017-05-09 — End: 2017-05-13
  Administered 2017-05-09 – 2017-05-12 (×7): 0.5 mg via INTRAVENOUS
  Filled 2017-05-09 (×4): qty 0.5
  Filled 2017-05-09: qty 1
  Filled 2017-05-09: qty 0.5
  Filled 2017-05-09: qty 1

## 2017-05-09 MED ORDER — SODIUM CHLORIDE 0.9 % IV BOLUS (SEPSIS)
1000.0000 mL | Freq: Once | INTRAVENOUS | Status: AC
  Administered 2017-05-09: 1000 mL via INTRAVENOUS

## 2017-05-09 MED ORDER — SODIUM CHLORIDE 0.9 % IV BOLUS (SEPSIS)
1000.0000 mL | Freq: Once | INTRAVENOUS | Status: AC
  Administered 2017-05-10: 1000 mL via INTRAVENOUS

## 2017-05-09 MED ORDER — HYDRALAZINE HCL 20 MG/ML IJ SOLN: 10.0000 mg | Freq: Once | INTRAMUSCULAR | Status: DC | PRN

## 2017-05-09 MED ORDER — VANCOMYCIN HCL IN DEXTROSE 1-5 GM/200ML-% IV SOLN
1000.0000 mg | Freq: Three times a day (TID) | INTRAVENOUS | Status: DC
  Administered 2017-05-10 – 2017-05-13 (×9): 1000 mg via INTRAVENOUS
  Filled 2017-05-09 (×12): qty 200

## 2017-05-09 MED ORDER — LACTATED RINGERS IV SOLN
INTRAVENOUS | Status: DC
  Administered 2017-05-09: 75 mL/h via INTRAVENOUS

## 2017-05-09 NOTE — Progress Notes (Signed)
Pharmacy Antibiotic Note  Jackie Chavez is a 21 y.o. female admitted on 05/09/2017 for r/o sepsis.  Pharmacy has been consulted for Vancomycin and Zosyn dosing. Pt is s/p LTCS on 12/16 at 34weeks.   Plan: Zosyn 3.375g IV q8h Vancomycin 2000mg  IV x 1 then 1000mg  IV q8h Will plan to check Vanc trough ~ 4th dose     Temp (24hrs), Avg:101.6 F (38.7 C), Min:101.6 F (38.7 C), Max:101.6 F (38.7 C)  Recent Labs  Lab 05/04/17 1735 05/05/17 0555  WBC 8.9 14.4*  CREATININE 0.60  --     Estimated Creatinine Clearance: 124.3 mL/min (by C-G formula based on SCr of 0.6 mg/dL).    No Known Allergies  Antimicrobials this admission:    Microbiology results: 12/20 BCx:            BCx: 12/20 UCx:    Thank you for allowing pharmacy to be a part of this patient's care.  Claybon Jabsngel, Cierrah Dace G 05/09/2017 9:56 PM

## 2017-05-09 NOTE — MAU Provider Note (Addendum)
History     CSN: [id number]  Arrival date and time: 05/09/17 2116   None     Chief Complaint  Patient presents with  . Postpartum Complications  . Post-op Problem   HPI This is a 21 year old G1 P0102 at 34 weeks and 3 days who is status post a cesarean section 4 days ago.  She was discharged yesterday by myself without concern.  She is having increasing abdominal pain with incision tenderness, and began to have fevers and shaking chills earlier today, body aches, back pain, myalgias.  She describes her lochia is normal and not increasing.  She has not been taking her Percocet due to fears of addiction as both her parents are addicts.  No other sick contacts.  She has been having a slight cough. No palliating or provoking factors. Is having some mild reddish/clear drainage from incision.    OB History    Gravida Para Term Preterm AB Living   1 1   1   2    SAB TAB Ectopic Multiple Live Births         1 2      Past Medical History:  Diagnosis Date  . Anemia   . Anxiety   . BV (bacterial vaginosis)   . Headache    migraines  . Hx of chlamydia infection 10/17/2016  . Pregnancy induced hypertension     Past Surgical History:  Procedure Laterality Date  . CESAREAN SECTION    . CESAREAN SECTION MULTI-GESTATIONAL N/A 05/05/2017   Procedure: CESAREAN SECTION MULTI-GESTATIONAL;  Surgeon: Catalina Antiguaonstant, Peggy, MD;  Location: [facility];  Service: Obstetrics;  Laterality: N/A;  . NO PAST SURGERIES      Family History  Problem Relation Age of Onset  . COPD Mother   . Diabetes Mother   . Mental illness Mother   . Alcohol abuse Mother   . Drug abuse Mother   . Diabetes Father   . Hypertension Father   . Alcohol abuse Father   . Drug abuse Father     Social History   Tobacco Use  . Smoking status: Former Games developermoker  . Smokeless tobacco: Never Used  Substance Use Topics  . Alcohol use: No  . Drug use: No    Allergies: No Known Allergies  Medications Prior to  Admission  Medication Sig Dispense Refill Last Dose  . amLODipine (NORVASC) 10 MG tablet Take 1 tablet (10 mg total) by mouth daily. 30 tablet 1 05/09/2017 at Unknown time  . Ferrous Fumarate (HEMOCYTE - 106 MG FE) 324 (106 Fe) MG TABS tablet Take 1 tablet (106 mg of iron total) by mouth 2 (two) times daily. 30 tablet 2 05/08/2017 at Unknown time  . ibuprofen (ADVIL,MOTRIN) 600 MG tablet Take 1 tablet (600 mg total) by mouth every 6 (six) hours as needed for mild pain. 30 tablet 0 05/09/2017 at Unknown time  . oxyCODONE-acetaminophen (PERCOCET/ROXICET) 5-325 MG tablet Take 1 tablet by mouth every 4 (four) hours as needed for moderate pain. 20 tablet 0 05/09/2017 at Unknown time  . senna-docusate (SENOKOT-S) 8.6-50 MG tablet Take 2 tablets by mouth daily. 60 tablet 1 05/08/2017 at Unknown time    Review of Systems Physical Exam   Blood pressure (!) 165/96, pulse (!) 133, temperature (!) 101.6 F (38.7 C), temperature source Oral, resp. rate (!) 23, last menstrual period 09/06/2016, SpO2 99 %, unknown if currently breastfeeding.  Physical Exam  Constitutional: She is oriented to person, place, and time. She appears well-developed and  well-nourished.  HENT:  Head: Normocephalic and atraumatic.  Right Ear: External ear normal.  Left Ear: External ear normal.  Eyes: Conjunctivae are normal. Pupils are equal, round, and reactive to light.  Neck: Normal range of motion. Neck supple.  Cardiovascular: Normal rate, regular rhythm and normal heart sounds.  Respiratory: Effort normal and breath sounds normal. No respiratory distress. She has no wheezes. She has no rales. She exhibits no tenderness.  GI: Soft. Bowel sounds are normal. She exhibits no distension. There is tenderness. There is no rebound and no guarding.    Neurological: She is alert and oriented to person, place, and time.  Skin: Skin is warm and dry.  Psychiatric: She has a normal mood and affect. Her behavior is normal. Judgment  and thought content normal.    CBC Results for orders placed or performed during the hospital encounter of 05/09/17 (from the past 24 hour(s))  Lactic acid, plasma     Status: None   Collection Time: 05/09/17  9:39 PM  Result Value Ref Range   Lactic Acid, Venous 1.7 0.5 - 1.9 mmol/L  CBC with Differential/Platelet     Status: Abnormal   Collection Time: 05/09/17  9:39 PM  Result Value Ref Range   WBC 13.3 (H) 4.0 - 10.5 K/uL   RBC 3.14 (L) 3.87 - 5.11 MIL/uL   Hemoglobin 8.5 (L) 12.0 - 15.0 g/dL   HCT 81.127.0 (L) 91.436.0 - 78.246.0 %   MCV 86.0 78.0 - 100.0 fL   MCH 27.1 26.0 - 34.0 pg   MCHC 31.5 30.0 - 36.0 g/dL   RDW 95.616.1 (H) 21.311.5 - 08.615.5 %   Platelets 256 150 - 400 K/uL   Neutrophils Relative % 85 %   Neutro Abs 11.3 (H) 1.7 - 7.7 K/uL   Lymphocytes Relative 8 %   Lymphs Abs 1.0 0.7 - 4.0 K/uL   Monocytes Relative 6 %   Monocytes Absolute 0.8 0.1 - 1.0 K/uL   Eosinophils Relative 1 %   Eosinophils Absolute 0.1 0.0 - 0.7 K/uL   Basophils Relative 0 %   Basophils Absolute 0.0 0.0 - 0.1 K/uL  Comprehensive metabolic panel     Status: Abnormal   Collection Time: 05/09/17  9:39 PM  Result Value Ref Range   Sodium 138 135 - 145 mmol/L   Potassium 4.4 3.5 - 5.1 mmol/L   Chloride 106 101 - 111 mmol/L   CO2 19 (L) 22 - 32 mmol/L   Glucose, Bld 112 (H) 65 - 99 mg/dL   BUN 14 6 - 20 mg/dL   Creatinine, Ser 5.780.67 0.44 - 1.00 mg/dL   Calcium 8.6 (L) 8.9 - 10.3 mg/dL   Total Protein 6.3 (L) 6.5 - 8.1 g/dL   Albumin 2.8 (L) 3.5 - 5.0 g/dL   AST 24 15 - 41 U/L   ALT 21 14 - 54 U/L   Alkaline Phosphatase 148 (H) 38 - 126 U/L   Total Bilirubin 0.6 0.3 - 1.2 mg/dL   GFR calc non Af Amer >60 >60 mL/min   GFR calc Af Amer >60 >60 mL/min   Anion gap 13 5 - 15  Protime-INR     Status: None   Collection Time: 05/09/17 10:20 PM  Result Value Ref Range   Prothrombin Time 13.1 11.4 - 15.2 seconds   INR 1.00   APTT     Status: None   Collection Time: 05/09/17 10:20 PM  Result Value Ref Range     aPTT 27 24 -  36 seconds  Influenza panel by PCR (type A & B)     Status: None   Collection Time: 05/09/17 10:30 PM  Result Value Ref Range   Influenza A By PCR NEGATIVE NEGATIVE   Influenza B By PCR NEGATIVE NEGATIVE  Urinalysis, Routine w reflex microscopic     Status: Abnormal   Collection Time: 05/09/17 10:50 PM  Result Value Ref Range   Color, Urine STRAW (A) YELLOW   APPearance CLEAR CLEAR   Specific Gravity, Urine 1.015 1.005 - 1.030   pH 7.0 5.0 - 8.0   Glucose, UA NEGATIVE NEGATIVE mg/dL   Hgb urine dipstick NEGATIVE NEGATIVE   Bilirubin Urine NEGATIVE NEGATIVE   Ketones, ur NEGATIVE NEGATIVE mg/dL   Protein, ur 161100 (A) NEGATIVE mg/dL   Nitrite NEGATIVE NEGATIVE   Leukocytes, UA NEGATIVE NEGATIVE   RBC / HPF 0-5 0 - 5 RBC/hpf   WBC, UA 0-5 0 - 5 WBC/hpf   Bacteria, UA NONE SEEN NONE SEEN   Squamous Epithelial / LPF NONE SEEN NONE SEEN   Mucus PRESENT    Dg Chest Port 1 View  Result Date: 05/09/2017 CLINICAL DATA:  Sepsis.  Fever.  Recent Caesarean section. EXAM: PORTABLE CHEST 1 VIEW COMPARISON:  None. FINDINGS: Low lung volumes. Prominent heart size likely accentuated by portable technique and low lung volumes. No pulmonary edema. No consolidation, pleural effusion, or pneumothorax. No acute osseous abnormalities are seen. IMPRESSION: Low lung volumes. Prominent heart size likely accentuated by portable technique and low lung volumes. Electronically Signed   By: Rubye OaksMelanie  Ehinger M.D.   On: 05/09/2017 22:53    MAU Course  Procedures  MDM   Sepsis protocol initiated at 2133  Blood cultures drawn at 2141  IVF bolus started at 2135  Urine cultures collected at 22:53  Magnesium started at 2215  IV labetalol given at 2230 (delayed because patient in pain and I wanted her pain to be better controlled prior to administration of antihypertensives, especially with potential sepsis).   IV antibiotics Ordered: 2200, Started: 2258     Assessment and Plan  1.  Sepsis  Admit - pt stable for med-surg  Cultures pending  Broad spectrum antibiotics started  MRSA swab  Lactic acid neg  Flu neg  2L given in ED - if continues to be tachycardic, will given additional liter of fluid  Continue IVF 2. Wound infection  ? Abscess  CT pending  Check CBC tomorrow 3. POD#4 from PLTCS  Dilaudid IV for pain 4. Anemia  Continue iron  CBC tomorrow 5. Preeclampsia with severe features  Previously received mag x24 hours prior to discharge  Patient discharged yesterday AM on antihypertensives, which patient has been taking  Continue magnesium  Pre-eclampsia labs normal  Continue BP control   Levie HeritageJacob J Stinson 05/09/2017, 10:08 PM

## 2017-05-09 NOTE — MAU Note (Signed)
Pt states she had a c/s on 12/16 and was discharged from hospital yesterday. States she has had pain in her incision all day today but got worse this evening. States she took ibuprofen, percocet and BP meds about 1 hour prior to arrival. States she had a temp of 102 at home. Family states she was "passing out in car" on the way here. Pt states bleeding is okay.

## 2017-05-10 ENCOUNTER — Encounter (HOSPITAL_COMMUNITY): Payer: Self-pay | Admitting: Certified Registered Nurse Anesthetist

## 2017-05-10 ENCOUNTER — Inpatient Hospital Stay (HOSPITAL_COMMUNITY): Payer: Medicaid Other | Admitting: Anesthesiology

## 2017-05-10 ENCOUNTER — Inpatient Hospital Stay (HOSPITAL_COMMUNITY): Payer: Medicaid Other

## 2017-05-10 ENCOUNTER — Encounter (HOSPITAL_COMMUNITY): Payer: Self-pay | Admitting: Obstetrics and Gynecology

## 2017-05-10 ENCOUNTER — Encounter (HOSPITAL_COMMUNITY): Payer: Self-pay

## 2017-05-10 ENCOUNTER — Encounter (HOSPITAL_COMMUNITY)
Admission: AD | Disposition: A | Discharge status: Home / Self Care | Payer: Self-pay | Source: Ambulatory Visit | Attending: Family Medicine

## 2017-05-10 ENCOUNTER — Other Ambulatory Visit: Payer: Self-pay

## 2017-05-10 ENCOUNTER — Ambulatory Visit (HOSPITAL_COMMUNITY): Payer: Medicaid Other

## 2017-05-10 DIAGNOSIS — O9081 Anemia of the puerperium: Secondary | ICD-10-CM | POA: Diagnosis present

## 2017-05-10 DIAGNOSIS — O8601 Infection of obstetric surgical wound, superficial incisional site: Secondary | ICD-10-CM | POA: Diagnosis present

## 2017-05-10 DIAGNOSIS — A419 Sepsis, unspecified organism: Secondary | ICD-10-CM | POA: Diagnosis present

## 2017-05-10 DIAGNOSIS — O8602 Infection of obstetric surgical wound, deep incisional site: Secondary | ICD-10-CM

## 2017-05-10 DIAGNOSIS — O8604 Sepsis following an obstetrical procedure: Secondary | ICD-10-CM | POA: Diagnosis present

## 2017-05-10 DIAGNOSIS — O1415 Severe pre-eclampsia, complicating the puerperium: Secondary | ICD-10-CM | POA: Diagnosis present

## 2017-05-10 DIAGNOSIS — Z98891 History of uterine scar from previous surgery: Secondary | ICD-10-CM

## 2017-05-10 DIAGNOSIS — O8600 Infection of obstetric surgical wound, unspecified: Secondary | ICD-10-CM | POA: Diagnosis present

## 2017-05-10 HISTORY — PX: WOUND DEBRIDEMENT: SHX247

## 2017-05-10 LAB — CBC WITH DIFFERENTIAL/PLATELET
BASOS PCT: 0 %
Basophils Absolute: 0 10*3/uL (ref 0.0–0.1)
Eosinophils Absolute: 0 10*3/uL (ref 0.0–0.7)
Eosinophils Relative: 0 %
HCT: 25.8 % — ABNORMAL LOW (ref 36.0–46.0)
Hemoglobin: 8.3 g/dL — ABNORMAL LOW (ref 12.0–15.0)
LYMPHS ABS: 1 10*3/uL (ref 0.7–4.0)
Lymphocytes Relative: 8 %
MCH: 26.9 pg (ref 26.0–34.0)
MCHC: 32.2 g/dL (ref 30.0–36.0)
MCV: 83.8 fL (ref 78.0–100.0)
MONO ABS: 0.4 10*3/uL (ref 0.1–1.0)
Monocytes Relative: 3 %
NEUTROS ABS: 11.5 10*3/uL — AB (ref 1.7–7.7)
NEUTROS PCT: 89 %
Other: 0 %
PLATELETS: 233 10*3/uL (ref 150–400)
RBC: 3.08 MIL/uL — AB (ref 3.87–5.11)
RDW: 16.7 % — AB (ref 11.5–15.5)
WBC: 12.9 10*3/uL — AB (ref 4.0–10.5)

## 2017-05-10 LAB — COMPREHENSIVE METABOLIC PANEL
ALBUMIN: 2.4 g/dL — AB (ref 3.5–5.0)
ALK PHOS: 118 U/L (ref 38–126)
ALT: 18 U/L (ref 14–54)
ALT: 15 U/L (ref 14–54)
ALT: 15 U/L (ref 14–54)
ANION GAP: 8 (ref 5–15)
AST: 15 U/L (ref 15–41)
AST: 13 U/L — ABNORMAL LOW (ref 15–41)
AST: 14 U/L — ABNORMAL LOW (ref 15–41)
Albumin: 2.3 g/dL — ABNORMAL LOW (ref 3.5–5.0)
Albumin: 2.3 g/dL — ABNORMAL LOW (ref 3.5–5.0)
Alkaline Phosphatase: 124 U/L (ref 38–126)
Alkaline Phosphatase: 121 U/L (ref 38–126)
Anion gap: 9 (ref 5–15)
Anion gap: 8 (ref 5–15)
BUN: 11 mg/dL (ref 6–20)
BUN: 10 mg/dL (ref 6–20)
BUN: 10 mg/dL (ref 6–20)
CALCIUM: 6.5 mg/dL — AB (ref 8.9–10.3)
CHLORIDE: 105 mmol/L (ref 101–111)
CHLORIDE: 107 mmol/L (ref 101–111)
CO2: 21 mmol/L — AB (ref 22–32)
CO2: 20 mmol/L — ABNORMAL LOW (ref 22–32)
CO2: 20 mmol/L — AB (ref 22–32)
CREATININE: 0.73 mg/dL (ref 0.44–1.00)
Calcium: 7.3 mg/dL — ABNORMAL LOW (ref 8.9–10.3)
Calcium: 6.6 mg/dL — ABNORMAL LOW (ref 8.9–10.3)
Chloride: 108 mmol/L (ref 101–111)
Creatinine, Ser: 0.72 mg/dL (ref 0.44–1.00)
Creatinine, Ser: 0.77 mg/dL (ref 0.44–1.00)
GFR calc Af Amer: >60 mL/min (ref >60)
GFR calc non Af Amer: >60 mL/min (ref >60)
GFR calc non Af Amer: >60 mL/min (ref >60)
GLUCOSE: 109 mg/dL — AB (ref 65–99)
Glucose, Bld: 101 mg/dL — ABNORMAL HIGH (ref 65–99)
Glucose, Bld: 111 mg/dL — ABNORMAL HIGH (ref 65–99)
POTASSIUM: 4 mmol/L (ref 3.5–5.1)
POTASSIUM: 3.8 mmol/L (ref 3.5–5.1)
Potassium: 4.2 mmol/L (ref 3.5–5.1)
SODIUM: 135 mmol/L (ref 135–145)
Sodium: 135 mmol/L (ref 135–145)
Sodium: 136 mmol/L (ref 135–145)
Total Bilirubin: 0.4 mg/dL (ref 0.3–1.2)
Total Bilirubin: 0.4 mg/dL (ref 0.3–1.2)
Total Bilirubin: 0.9 mg/dL (ref 0.3–1.2)
Total Protein: 6 g/dL — ABNORMAL LOW (ref 6.5–8.1)
Total Protein: 5 g/dL — ABNORMAL LOW (ref 6.5–8.1)
Total Protein: 5.8 g/dL — ABNORMAL LOW (ref 6.5–8.1)

## 2017-05-10 LAB — APTT: APTT: 39 s — AB (ref 24–36)

## 2017-05-10 LAB — CBC
HEMATOCRIT: 24.7 % — AB (ref 36.0–46.0)
Hemoglobin: 7.9 g/dL — ABNORMAL LOW (ref 12.0–15.0)
MCH: 27.1 pg (ref 26.0–34.0)
MCHC: 32 g/dL (ref 30.0–36.0)
MCV: 84.6 fL (ref 78.0–100.0)
PLATELETS: 224 10*3/uL (ref 150–400)
RBC: 2.92 MIL/uL — ABNORMAL LOW (ref 3.87–5.11)
RDW: 16.6 % — AB (ref 11.5–15.5)
WBC: 13.3 10*3/uL — AB (ref 4.0–10.5)

## 2017-05-10 LAB — LACTIC ACID, PLASMA
LACTIC ACID, VENOUS: 0.8 mmol/L (ref 0.5–1.9)
LACTIC ACID, VENOUS: 0.7 mmol/L (ref 0.5–1.9)

## 2017-05-10 LAB — MAGNESIUM: MAGNESIUM: 4.6 mg/dL — AB (ref 1.7–2.4)

## 2017-05-10 SURGERY — DEBRIDEMENT, WOUND, ABDOMEN
Anesthesia: General | Site: Abdomen

## 2017-05-10 MED ORDER — FUROSEMIDE 10 MG/ML IJ SOLN
20.0000 mg | Freq: Once | INTRAMUSCULAR | Status: AC
  Administered 2017-05-10: 20 mg via INTRAVENOUS
  Filled 2017-05-10: qty 2

## 2017-05-10 MED ORDER — DEXAMETHASONE SODIUM PHOSPHATE 10 MG/ML IJ SOLN
INTRAMUSCULAR | Status: AC
  Filled 2017-05-10: qty 1

## 2017-05-10 MED ORDER — DIPHENHYDRAMINE HCL 25 MG PO CAPS
25.0000 mg | ORAL_CAPSULE | Freq: Four times a day (QID) | ORAL | Status: DC | PRN
  Administered 2017-05-10 – 2017-05-11 (×2): 25 mg via ORAL
  Filled 2017-05-10 (×2): qty 1

## 2017-05-10 MED ORDER — LIDOCAINE HCL (CARDIAC) 20 MG/ML IV SOLN
INTRAVENOUS | Status: AC
  Filled 2017-05-10: qty 5

## 2017-05-10 MED ORDER — FENTANYL CITRATE (PF) 100 MCG/2ML IJ SOLN
INTRAMUSCULAR | Status: AC
  Filled 2017-05-10: qty 2

## 2017-05-10 MED ORDER — LACTATED RINGERS IV SOLN
INTRAVENOUS | Status: DC
  Administered 2017-05-10 – 2017-05-11 (×3): via INTRAVENOUS

## 2017-05-10 MED ORDER — ONDANSETRON HCL 4 MG/2ML IJ SOLN
INTRAMUSCULAR | Status: DC | PRN
  Administered 2017-05-10: 4 mg via INTRAVENOUS

## 2017-05-10 MED ORDER — ACETAMINOPHEN 650 MG RE SUPP
650.0000 mg | Freq: Four times a day (QID) | RECTAL | Status: DC | PRN
  Administered 2017-05-10: 650 mg via RECTAL
  Filled 2017-05-10: qty 1

## 2017-05-10 MED ORDER — PROPOFOL 10 MG/ML IV BOLUS
INTRAVENOUS | Status: DC | PRN
  Administered 2017-05-10: 100 mg via INTRAVENOUS
  Administered 2017-05-10: 200 mg via INTRAVENOUS

## 2017-05-10 MED ORDER — FERROUS FUMARATE 324 (106 FE) MG PO TABS
1.0000 | ORAL_TABLET | Freq: Two times a day (BID) | ORAL | Status: DC
  Administered 2017-05-11 – 2017-05-13 (×5): 106 mg via ORAL
  Filled 2017-05-10 (×7): qty 1

## 2017-05-10 MED ORDER — DEXAMETHASONE SODIUM PHOSPHATE 10 MG/ML IJ SOLN
INTRAMUSCULAR | Status: DC | PRN
  Administered 2017-05-10: 10 mg via INTRAVENOUS

## 2017-05-10 MED ORDER — IBUPROFEN 600 MG PO TABS
600.0000 mg | ORAL_TABLET | Freq: Four times a day (QID) | ORAL | Status: DC | PRN
  Administered 2017-05-10 – 2017-05-13 (×5): 600 mg via ORAL
  Filled 2017-05-10 (×6): qty 1

## 2017-05-10 MED ORDER — ACETAMINOPHEN 325 MG PO TABS
650.0000 mg | ORAL_TABLET | Freq: Four times a day (QID) | ORAL | Status: DC | PRN
  Administered 2017-05-10 (×2): 650 mg via ORAL
  Filled 2017-05-10 (×2): qty 2

## 2017-05-10 MED ORDER — PROPOFOL 10 MG/ML IV BOLUS
INTRAVENOUS | Status: AC
  Filled 2017-05-10: qty 40

## 2017-05-10 MED ORDER — FENTANYL CITRATE (PF) 100 MCG/2ML IJ SOLN
INTRAMUSCULAR | Status: DC | PRN
  Administered 2017-05-10: 100 ug via INTRAVENOUS

## 2017-05-10 MED ORDER — MIDAZOLAM HCL 2 MG/2ML IJ SOLN
INTRAMUSCULAR | Status: AC
  Filled 2017-05-10: qty 2

## 2017-05-10 MED ORDER — SODIUM CHLORIDE 0.9 % IV SOLN
INTRAVENOUS | Status: DC | PRN
  Administered 2017-05-10: 19:00:00 via INTRAVENOUS

## 2017-05-10 MED ORDER — LIDOCAINE HCL (CARDIAC) 20 MG/ML IV SOLN
INTRAVENOUS | Status: DC | PRN
  Administered 2017-05-10: 30 mg via INTRAVENOUS

## 2017-05-10 MED ORDER — PROMETHAZINE HCL 25 MG/ML IJ SOLN: 6.2500 mg | INTRAMUSCULAR | Status: DC | PRN

## 2017-05-10 MED ORDER — OXYCODONE HCL 5 MG PO TABS: 5.0000 mg | ORAL_TABLET | Freq: Once | ORAL | Status: DC | PRN

## 2017-05-10 MED ORDER — ONDANSETRON HCL 4 MG/2ML IJ SOLN: 4.0000 mg | Freq: Four times a day (QID) | INTRAMUSCULAR | Status: DC | PRN

## 2017-05-10 MED ORDER — HYDROMORPHONE HCL 1 MG/ML IJ SOLN
INTRAMUSCULAR | Status: AC
  Filled 2017-05-10: qty 1

## 2017-05-10 MED ORDER — HYDROMORPHONE HCL 1 MG/ML IJ SOLN
0.2500 mg | INTRAMUSCULAR | Status: DC | PRN
  Administered 2017-05-10 (×2): 0.5 mg via INTRAVENOUS

## 2017-05-10 MED ORDER — AMLODIPINE BESYLATE 10 MG PO TABS: 10.0000 mg | ORAL_TABLET | Freq: Every day | ORAL | Status: DC

## 2017-05-10 MED ORDER — ROCURONIUM BROMIDE 100 MG/10ML IV SOLN
INTRAVENOUS | Status: DC | PRN
  Administered 2017-05-10 (×2): 5 mg via INTRAVENOUS

## 2017-05-10 MED ORDER — LACTATED RINGERS IV SOLN
INTRAVENOUS | Status: DC
  Administered 2017-05-10: 75 mL/h via INTRAVENOUS

## 2017-05-10 MED ORDER — ROCURONIUM BROMIDE 100 MG/10ML IV SOLN
INTRAVENOUS | Status: AC
  Filled 2017-05-10: qty 1

## 2017-05-10 MED ORDER — ENOXAPARIN SODIUM 40 MG/0.4ML ~~LOC~~ SOLN
40.0000 mg | SUBCUTANEOUS | Status: DC
  Administered 2017-05-10 – 2017-05-13 (×4): 40 mg via SUBCUTANEOUS
  Filled 2017-05-10 (×5): qty 0.4

## 2017-05-10 MED ORDER — LACTATED RINGERS IV BOLUS (SEPSIS): 500.0000 mL | Freq: Once | INTRAVENOUS | Status: DC

## 2017-05-10 MED ORDER — SUGAMMADEX SODIUM 200 MG/2ML IV SOLN
INTRAVENOUS | Status: DC | PRN
  Administered 2017-05-10: 100 mg via INTRAVENOUS

## 2017-05-10 MED ORDER — ONDANSETRON HCL 4 MG PO TABS: 4.0000 mg | ORAL_TABLET | Freq: Four times a day (QID) | ORAL | Status: DC | PRN

## 2017-05-10 MED ORDER — OXYCODONE HCL 5 MG/5ML PO SOLN: 5.0000 mg | Freq: Once | ORAL | Status: DC | PRN

## 2017-05-10 MED ORDER — IOPAMIDOL (ISOVUE-300) INJECTION 61%
100.0000 mL | Freq: Once | INTRAVENOUS | Status: AC | PRN
  Administered 2017-05-10: 100 mL via INTRAVENOUS

## 2017-05-10 MED ORDER — SENNOSIDES-DOCUSATE SODIUM 8.6-50 MG PO TABS
2.0000 | ORAL_TABLET | ORAL | Status: DC
  Administered 2017-05-11 – 2017-05-13 (×3): 2 via ORAL
  Filled 2017-05-10 (×3): qty 2

## 2017-05-10 MED ORDER — ONDANSETRON HCL 4 MG/2ML IJ SOLN
INTRAMUSCULAR | Status: AC
  Filled 2017-05-10: qty 2

## 2017-05-10 MED ORDER — MIDAZOLAM HCL 2 MG/2ML IJ SOLN
INTRAMUSCULAR | Status: DC | PRN
  Administered 2017-05-10: 2 mg via INTRAVENOUS

## 2017-05-10 SURGICAL SUPPLY — 18 items
CANISTER SUCT 3000ML PPV (MISCELLANEOUS) ×4 IMPLANT
CLOTH BEACON ORANGE TIMEOUT ST (SAFETY) ×1 IMPLANT
DRAPE WARM FLUID 44X44 (DRAPE) IMPLANT
DRSG OPSITE POSTOP 4X10 (GAUZE/BANDAGES/DRESSINGS) IMPLANT
DURAPREP 26ML APPLICATOR (WOUND CARE) ×1 IMPLANT
ELECT REM PT RETURN 9FT ADLT (ELECTROSURGICAL) ×4
ELECTRODE REM PT RTRN 9FT ADLT (ELECTROSURGICAL) ×1 IMPLANT
GAUZE SPONGE 4X4 16PLY XRAY LF (GAUZE/BANDAGES/DRESSINGS) IMPLANT
GLOVE BIO SURGEON STRL SZ8 (GLOVE) ×8 IMPLANT
GLOVE BIOGEL PI IND STRL 7.0 (GLOVE) ×4 IMPLANT
GLOVE BIOGEL PI INDICATOR 7.0 (GLOVE) ×2
GLOVE SKINSENSE NS SZ7.0 (GLOVE) ×4
GLOVE SKINSENSE STRL SZ7.0 (GLOVE) ×4 IMPLANT
GOWN STRL REUS W/TWL LRG LVL3 (GOWN DISPOSABLE) ×8 IMPLANT
IV SOD CHL 0.9% 1000ML (IV SOLUTION) ×6 IMPLANT
PACK ABDOMINAL GYN (CUSTOM PROCEDURE TRAY) ×3 IMPLANT
PENCIL SMOKE EVAC W/HOLSTER (ELECTROSURGICAL) ×1 IMPLANT
PROTECTOR NERVE ULNAR (MISCELLANEOUS) ×1 IMPLANT

## 2017-05-10 NOTE — Progress Notes (Deleted)
Subjective: Patient reports still having some abd pain. Pain medication helps some. Mild headache as well but no visual changes  Objective: Temp 103  Lungs clear Heart RRR  Breast supple not engorged Abd soft faint BS drsg intact Ext 1+ edema     Assessment/Plan: POD # 6 LTCS twins d/t prolapse cord SPEC ? Sepsis  Pt still febrile. W/U thus far no clear source of infection except possible endometritis. Will continue with antibiotic therapy. Tylenol/Motrin for fever. Repeat labs in AM. Continue with magnesium therapy.  LOS: 0 days    Hermina StaggersMichael L Nurah Petrides 05/10/2017, 4:13 PM

## 2017-05-10 NOTE — Progress Notes (Signed)
Dr. Alysia PennaErvin notified of 103.0 temp. No new orders.

## 2017-05-10 NOTE — Progress Notes (Signed)
1620 Patient holding breath and disoriented x4. Dr. Alysia PennaErvin notified and to the bedside.  2L O2 started. Bp stable. HR 150s. 650mg  rectal Tylenol given per MD order for Temp 102.9.  Pt prepped for OR per MD orders including foley insertion and stat labs.  Awaiting OR time.

## 2017-05-10 NOTE — Consult Note (Signed)
F/U Lactation note: 2nd LC visit with mom.  LC offered to assist with using the DEBP to protect establishing milk supply.  [facility] and the RN orientee.  Started with the #24 Flange on both breast, comfortable fit per mom.  The last 7 mins of pumping when EBM flow slowed down, switched to the #27 Flanges  Both breast and noted to be giving mom better stimulation. Pumped both breast for 15 -20 mins,  With breast compressions with 30 ml EBM yield for each breast. Mom seemed delighted that her babies  Would be receiving her breast milk.  The HROB NT planned on obtaining her EBM labels and taking the 2 bottles to NICU.  LC discussed the importance oc consistent pumping about every 3 hours to help keep temperature  Down and protect milk supply. Also spoke with the HROB RN caring for the mom and stressed the importance  Of consistent pumping. LC noted after pumping both breast had cooled down.  LC O/P 12/22.

## 2017-05-10 NOTE — Progress Notes (Addendum)
  2134: Lactic Acid resulted 0.7  2227: Diet order received, pt was given a [place] sandwich and a ginger Ale Pop.

## 2017-05-10 NOTE — Progress Notes (Addendum)
Rn entered room at 0915 to find patient on floor on her knees. Patient stated she need to use the restroom and thought she could ambulate on her own.  Upon standing at the bedside the patient felt dizzy and fell to her knees. Patient stated this event happened just prior to the RN entering the room and she had not been on the floor but for a few seconds.  Patient was assisted back to bed by 3 RNs.  Vital signs normal upon assessment.  Incision was assessed: approximated and no signs of bleeding.  No signs of injury upon assessment.  Patient instructed to call for assistance prior to ambulation.  Dr. Alysia PennaErvin in department and notified of event.

## 2017-05-10 NOTE — Consult Note (Signed)
Lactation Note:  Mom readmitted 12/20 to R/O Sepsis, and high B/P.  Placed on MagSo4,- L1  On  double antibiotics ) IV - Zosyn -L2 , Vancomycin L1  As LC entered room mom resting in bed, with IV's.  Briefly spoke with mom and mentioned she had been pumping every 2 hours about 80 ml at a time.  Plan is to see if mom is willing to have assistance to use the DEBP, to prevent issues with engorgement.  Per mom I haven't had any engorgement since being discharged from the hospital.

## 2017-05-10 NOTE — Progress Notes (Signed)
CSW met with MOB on 05/05/2017 and completed a clinical assessment and provided MOB with resources.  CSW will continue to provide services to the family while twins remain in NICU. MOB has a stable home to she will return to after d/c.   Laurey Arrow, MSW, LCSW Clinical Social Work [phone]

## 2017-05-10 NOTE — Progress Notes (Signed)
Patient ID: Jackie DesanctisJaximica M Chavez, female   DOB: 09/27/1995, 21 y.o.   MRN: [id number] HD # 1  Pt reports still felling bad. Mild HA. Still with fever as well Abd pain as well.  PE Tm 103 Tc 102.9 BP 144/85  P 117  Lungs clear Heart tachycardic Breast supple not engorged Abd soft obese purulent drainage noted from incision site erythematous around incision as well GU scant lochia Ext 1 + edema  A/P POD # 6 LTCS        Sepsis suspect secondary to wound infection        SPEC  Pt became confused shortly after being seen. VSS/O2 sat stable. O2 started, labs drawn, foley placed. SCD's placed. Feel wound debridement warranted d/t to continue fever. Need to evaluate for NF. Pt ate @ 10 AM. Will wait for labs to return and then proceed to OR, 8 hrs after eating. Nursing supervisor aware of need for surgery.

## 2017-05-10 NOTE — Anesthesia Preprocedure Evaluation (Signed)
Anesthesia Evaluation  Patient identified by MRN, date of birth, ID band Patient awake    Reviewed: Allergy & Precautions, NPO status , Patient's Chart, lab work & pertinent test results  Airway Mallampati: II  TM Distance: >3 FB Neck ROM: Full    Dental no notable dental hx.    Pulmonary former smoker,    Pulmonary exam normal breath sounds clear to auscultation       Cardiovascular hypertension, Pt. on medications Normal cardiovascular exam Rhythm:Regular Rate:Normal     Neuro/Psych  Headaches, Anxiety    GI/Hepatic negative GI ROS, Neg liver ROS,   Endo/Other  Morbid obesity  Renal/GU negative Renal ROS     Musculoskeletal negative musculoskeletal ROS (+)   Abdominal (+) + obese,   Peds  Hematology  (+) anemia ,   Anesthesia Other Findings   Reproductive/Obstetrics                             Anesthesia Physical Anesthesia Plan  ASA: III  Anesthesia Plan: General   Post-op Pain Management:    Induction: Intravenous and Rapid sequence  PONV Risk Score and Plan: 3 and Midazolam, Dexamethasone, Ondansetron and Treatment may vary due to age or medical condition  Airway Management Planned: Oral ETT  Additional Equipment:   Intra-op Plan:   Post-operative Plan: Extubation in OR  Informed Consent: I have reviewed the patients History and Physical, chart, labs and discussed the procedure including the risks, benefits and alternatives for the proposed anesthesia with the patient or authorized representative who has indicated his/her understanding and acceptance.   Dental advisory given  Plan Discussed with: CRNA  Anesthesia Plan Comments:         Anesthesia Quick Evaluation

## 2017-05-10 NOTE — H&P (Addendum)
History     CSN: [id number]  Arrival date and time: 05/09/17 2116   None     Chief Complaint  Patient presents with  . Postpartum Complications  . Post-op Problem   HPI This is a 21 year old G1 P0102 at 34 weeks and 3 days who is status post a cesarean section 4 days ago.  She was discharged yesterday by myself without concern.  She is having increasing abdominal pain with incision tenderness, and began to have fevers and shaking chills earlier today, body aches, back pain, myalgias.  She describes her lochia is normal and not increasing.  She has not been taking her Percocet due to fears of addiction as both her parents are addicts.  No other sick contacts.  She has been having a slight cough. No palliating or provoking factors. Is having some mild reddish/clear drainage from incision.    OB History    Gravida Para Term Preterm AB Living   1 1   1   2    SAB TAB Ectopic Multiple Live Births         1 2      Past Medical History:  Diagnosis Date  . Anemia   . Anxiety   . BV (bacterial vaginosis)   . Headache    migraines  . Hx of chlamydia infection 10/17/2016  . Pregnancy induced hypertension     Past Surgical History:  Procedure Laterality Date  . CESAREAN SECTION    . CESAREAN SECTION MULTI-GESTATIONAL N/A 05/05/2017   Procedure: CESAREAN SECTION MULTI-GESTATIONAL;  Surgeon: Catalina Antiguaonstant, Peggy, MD;  Location: [facility];  Service: Obstetrics;  Laterality: N/A;  . NO PAST SURGERIES      Family History  Problem Relation Age of Onset  . COPD Mother   . Diabetes Mother   . Mental illness Mother   . Alcohol abuse Mother   . Drug abuse Mother   . Diabetes Father   . Hypertension Father   . Alcohol abuse Father   . Drug abuse Father     Social History   Tobacco Use  . Smoking status: Former Games developermoker  . Smokeless tobacco: Never Used  Substance Use Topics  . Alcohol use: No  . Drug use: No    Allergies: No Known Allergies  Medications Prior to  Admission  Medication Sig Dispense Refill Last Dose  . amLODipine (NORVASC) 10 MG tablet Take 1 tablet (10 mg total) by mouth daily. 30 tablet 1 05/09/2017 at Unknown time  . Ferrous Fumarate (HEMOCYTE - 106 MG FE) 324 (106 Fe) MG TABS tablet Take 1 tablet (106 mg of iron total) by mouth 2 (two) times daily. 30 tablet 2 05/08/2017 at Unknown time  . ibuprofen (ADVIL,MOTRIN) 600 MG tablet Take 1 tablet (600 mg total) by mouth every 6 (six) hours as needed for mild pain. 30 tablet 0 05/09/2017 at Unknown time  . oxyCODONE-acetaminophen (PERCOCET/ROXICET) 5-325 MG tablet Take 1 tablet by mouth every 4 (four) hours as needed for moderate pain. 20 tablet 0 05/09/2017 at Unknown time  . senna-docusate (SENOKOT-S) 8.6-50 MG tablet Take 2 tablets by mouth daily. 60 tablet 1 05/08/2017 at Unknown time    Review of Systems Physical Exam   Blood pressure (!) 165/96, pulse (!) 133, temperature (!) 101.6 F (38.7 C), temperature source Oral, resp. rate (!) 23, last menstrual period 09/06/2016, SpO2 99 %, unknown if currently breastfeeding.  Physical Exam  Constitutional: She is oriented to person, place, and time. She appears well-developed and  well-nourished.  HENT:  Head: Normocephalic and atraumatic.  Right Ear: External ear normal.  Left Ear: External ear normal.  Eyes: Conjunctivae are normal. Pupils are equal, round, and reactive to light.  Neck: Normal range of motion. Neck supple.  Cardiovascular: Normal rate, regular rhythm and normal heart sounds.  Respiratory: Effort normal and breath sounds normal. No respiratory distress. She has no wheezes. She has no rales. She exhibits no tenderness.  GI: Soft. Bowel sounds are normal. She exhibits no distension. There is tenderness. There is no rebound and no guarding.    Neurological: She is alert and oriented to person, place, and time.  Skin: Skin is warm and dry.  Psychiatric: She has a normal mood and affect. Her behavior is normal. Judgment  and thought content normal.    CBC Results for orders placed or performed during the hospital encounter of 05/09/17 (from the past 24 hour(s))  Lactic acid, plasma     Status: None   Collection Time: 05/09/17  9:39 PM  Result Value Ref Range   Lactic Acid, Venous 1.7 0.5 - 1.9 mmol/L  CBC with Differential/Platelet     Status: Abnormal   Collection Time: 05/09/17  9:39 PM  Result Value Ref Range   WBC 13.3 (H) 4.0 - 10.5 K/uL   RBC 3.14 (L) 3.87 - 5.11 MIL/uL   Hemoglobin 8.5 (L) 12.0 - 15.0 g/dL   HCT 21.327.0 (L) 08.636.0 - 57.846.0 %   MCV 86.0 78.0 - 100.0 fL   MCH 27.1 26.0 - 34.0 pg   MCHC 31.5 30.0 - 36.0 g/dL   RDW 46.916.1 (H) 62.911.5 - 52.815.5 %   Platelets 256 150 - 400 K/uL   Neutrophils Relative % 85 %   Neutro Abs 11.3 (H) 1.7 - 7.7 K/uL   Lymphocytes Relative 8 %   Lymphs Abs 1.0 0.7 - 4.0 K/uL   Monocytes Relative 6 %   Monocytes Absolute 0.8 0.1 - 1.0 K/uL   Eosinophils Relative 1 %   Eosinophils Absolute 0.1 0.0 - 0.7 K/uL   Basophils Relative 0 %   Basophils Absolute 0.0 0.0 - 0.1 K/uL  Comprehensive metabolic panel     Status: Abnormal   Collection Time: 05/09/17  9:39 PM  Result Value Ref Range   Sodium 138 135 - 145 mmol/L   Potassium 4.4 3.5 - 5.1 mmol/L   Chloride 106 101 - 111 mmol/L   CO2 19 (L) 22 - 32 mmol/L   Glucose, Bld 112 (H) 65 - 99 mg/dL   BUN 14 6 - 20 mg/dL   Creatinine, Ser 4.130.67 0.44 - 1.00 mg/dL   Calcium 8.6 (L) 8.9 - 10.3 mg/dL   Total Protein 6.3 (L) 6.5 - 8.1 g/dL   Albumin 2.8 (L) 3.5 - 5.0 g/dL   AST 24 15 - 41 U/L   ALT 21 14 - 54 U/L   Alkaline Phosphatase 148 (H) 38 - 126 U/L   Total Bilirubin 0.6 0.3 - 1.2 mg/dL   GFR calc non Af Amer >60 >60 mL/min   GFR calc Af Amer >60 >60 mL/min   Anion gap 13 5 - 15  Protime-INR     Status: None   Collection Time: 05/09/17 10:20 PM  Result Value Ref Range   Prothrombin Time 13.1 11.4 - 15.2 seconds   INR 1.00   APTT     Status: None   Collection Time: 05/09/17 10:20 PM  Result Value Ref Range     aPTT 27 24 -  36 seconds  Influenza panel by PCR (type A & B)     Status: None   Collection Time: 05/09/17 10:30 PM  Result Value Ref Range   Influenza A By PCR NEGATIVE NEGATIVE   Influenza B By PCR NEGATIVE NEGATIVE  Urinalysis, Routine w reflex microscopic     Status: Abnormal   Collection Time: 05/09/17 10:50 PM  Result Value Ref Range   Color, Urine STRAW (A) YELLOW   APPearance CLEAR CLEAR   Specific Gravity, Urine 1.015 1.005 - 1.030   pH 7.0 5.0 - 8.0   Glucose, UA NEGATIVE NEGATIVE mg/dL   Hgb urine dipstick NEGATIVE NEGATIVE   Bilirubin Urine NEGATIVE NEGATIVE   Ketones, ur NEGATIVE NEGATIVE mg/dL   Protein, ur 191 (A) NEGATIVE mg/dL   Nitrite NEGATIVE NEGATIVE   Leukocytes, UA NEGATIVE NEGATIVE   RBC / HPF 0-5 0 - 5 RBC/hpf   WBC, UA 0-5 0 - 5 WBC/hpf   Bacteria, UA NONE SEEN NONE SEEN   Squamous Epithelial / LPF NONE SEEN NONE SEEN   Mucus PRESENT    Dg Chest Port 1 View  Result Date: 05/09/2017 CLINICAL DATA:  Sepsis.  Fever.  Recent Caesarean section. EXAM: PORTABLE CHEST 1 VIEW COMPARISON:  None. FINDINGS: Low lung volumes. Prominent heart size likely accentuated by portable technique and low lung volumes. No pulmonary edema. No consolidation, pleural effusion, or pneumothorax. No acute osseous abnormalities are seen. IMPRESSION: Low lung volumes. Prominent heart size likely accentuated by portable technique and low lung volumes. Electronically Signed   By: Rubye Oaks M.D.   On: 05/09/2017 22:53    MAU Course  Procedures  MDM   Sepsis protocol initiated at 2133  Blood cultures drawn at 2141  IVF bolus started at 2135  Urine cultures collected at 22:53  Magnesium started at 2215  IV labetalol given at 2230 (delayed because patient in pain and I wanted her pain to be better controlled prior to administration of antihypertensives, especially with potential sepsis).   IV antibiotics Ordered: 2200, Started: 2258     Assessment and Plan  1.  Sepsis  Admit - pt stable for med-surg  Cultures pending  Broad spectrum antibiotics started  MRSA swab  Lactic acid neg  Flu neg  I personally read patient's CXR - no evidence of pneumonia  2L given in ED - if continues to be tachycardic, will given additional liter of fluid  Continue IVF 2. Wound infection  ? Abscess  CT pending  Check CBC tomorrow 3. POD#4 from PLTCS  Dilaudid IV for pain 4. Anemia  Continue iron  CBC tomorrow 5. Preeclampsia with severe features  Previously received mag x24 hours prior to discharge  Patient discharged yesterday AM on antihypertensives, which patient has been taking  Continue magnesium  Pre-eclampsia labs normal  Continue BP control   Levie Heritage 05/09/2017, 10:08 PM

## 2017-05-10 NOTE — Transfer of Care (Signed)
Immediate Anesthesia Transfer of Care Note  Patient: Jackie Chavez  Procedure(s) Performed: DEBRIDEMENT ABDOMINAL WOUND (N/A Abdomen)  Patient Location: PACU  Anesthesia Type:General  Level of Consciousness: awake, alert , oriented and patient cooperative  Airway & Oxygen Therapy: Patient Spontanous Breathing and Patient connected to nasal cannula oxygen  Post-op Assessment: Report given to RN, Post -op Vital signs reviewed and stable and Patient moving all extremities X 4  Post vital signs: Reviewed and stable  Last Vitals:  Vitals:   05/10/17 1620 05/10/17 1815  BP:  (!) 122/53  Pulse:  (!) 118  Resp:  (!) 21  Temp: (!) 39.4 C (!) 38.1 C  SpO2:  96%    Last Pain:  Vitals:   05/10/17 1815  TempSrc: Oral  PainSc:       Patients Stated Pain Goal: 3 (05/10/17 [id number])  Complications: No apparent anesthesia complications

## 2017-05-10 NOTE — Op Note (Signed)
Preoperative diagnosis: Postoperative wound infection  Postoperative diagnosis: Same as above  Procedure: Wound exploration with debridement                     Placement of wound VAC  Surgeon:  Lazaro ArmsLuther H Albertia Carvin, MD  Anesthesia: General endotracheal  Findings: Patient was admitted almost 24 hours ago, postoperative day #6, with fever and abdominal pain.  Over the day today her incision has become increasingly erythematous tender and has started draining purulent fluid.  The patient's fever curve is still quite hectic and while this may not be her only source of infection with the change in the cellulitis of her abdominal wall this certainly needs to be managed surgically.    Description of operation: Patient was taken to the operating room and placed in the supine position she underwent general endotracheal anesthesia She was then prepped prepped and draped in usual sterile fashion using Betadine Her incision was opened by cutting the subcuticular suture and when only a portion of it was cut a large amount of purulent fluid was expressed Aerobic and anaerobic cultures were obtained The incision was opened fully and a large amount of the fat was involved but there was no particular area of dramatic necrosis The fascia was intact throughout the entire length of the incision A surgical scrub brush was used in the subcu tissue was scrubbed vigorously for several minutes and all the dusky necrotic surface fat was removed All of the tissue was slightly oozing and appeared to be healthy Again I did a finger exploration of the incision corners and it did track laterally a small bit but that was opened as well A wound VAC was used and the sponge was cut to fit into the incision The adhesive was placed over the sponge and the wound VAC was placed and excellent vacuum was obtained There was contraction of the incision around the wound VAC sponge  The patient tolerated procedure well She experienced  minimal blood loss No preoperative antibiotics were used since the patient is on Zosyn and vancomycin  Patient was awakened from anesthesia and taken recovery room good stable condition all counts are correct The wound VAC will be left in place for at least a week and wound care team will be contacted for outpatient management In the meantime the patient will be continued on vancomycin and Zosyn and await wound cultures  Lazaro ArmsLuther H Tobin Witucki, MD 05/10/2017 7:55 PM

## 2017-05-10 NOTE — Anesthesia Procedure Notes (Signed)
Procedure Name: Intubation Date/Time: 05/10/2017 7:11 PM Performed by: Alvy Bimler, CRNA Pre-anesthesia Checklist: Patient identified, Patient being monitored, Timeout performed, Emergency Drugs available and Suction available Patient Re-evaluated:Patient Re-evaluated prior to induction Oxygen Delivery Method: Circle System Utilized Preoxygenation: Pre-oxygenation with 100% oxygen Induction Type: IV induction Ventilation: Mask ventilation without difficulty Laryngoscope Size: Mac and 3 Grade View: Grade II Tube type: Oral Tube size: 7.0 mm Number of attempts: 1 Airway Equipment and Method: stylet Placement Confirmation: ETT inserted through vocal cords under direct vision,  positive ETCO2 and breath sounds checked- equal and bilateral Secured at: 21 cm Tube secured with: Tape Dental Injury: Teeth and Oropharynx as per pre-operative assessment

## 2017-05-11 ENCOUNTER — Encounter (HOSPITAL_COMMUNITY): Payer: Self-pay | Admitting: Obstetrics & Gynecology

## 2017-05-11 LAB — URINE CULTURE: CULTURE: NO GROWTH

## 2017-05-11 LAB — VANCOMYCIN, TROUGH: VANCOMYCIN TR: 11 ug/mL — AB (ref 15–20)

## 2017-05-11 MED ORDER — ENALAPRIL MALEATE 10 MG PO TABS
10.0000 mg | ORAL_TABLET | Freq: Every day | ORAL | Status: DC
  Administered 2017-05-11 – 2017-05-13 (×3): 10 mg via ORAL
  Filled 2017-05-11 (×4): qty 1

## 2017-05-11 MED ORDER — FUROSEMIDE 10 MG/ML IJ SOLN
20.0000 mg | Freq: Once | INTRAMUSCULAR | Status: AC
  Administered 2017-05-11: 20 mg via INTRAVENOUS
  Filled 2017-05-11: qty 2

## 2017-05-11 MED ORDER — SODIUM CHLORIDE 0.9 % IV SOLN
510.0000 mg | Freq: Once | INTRAVENOUS | Status: AC
  Administered 2017-05-11: 510 mg via INTRAVENOUS
  Filled 2017-05-11: qty 17

## 2017-05-11 NOTE — Progress Notes (Signed)
MEDICATION RELATED CONSULT NOTE - FOLLOW UP   Pharmacy Consult for Vancomycin and Zosyn Indication: Wound infection  No Known Allergies  Patient Measurements: Height: 5\' 1"  (154.9 cm) Weight: 228 lb 8 oz (103.6 kg) IBW/kg (Calculated) : 47.8   Vital Signs: Temp: 98.5 F (36.9 C) (12/22 1631) Temp Source: Oral (12/22 1631) BP: 136/57 (12/22 1631) Pulse Rate: 96 (12/22 1631) Intake/Output from previous day: 12/21 0701 - 12/22 0700 In: 3452 [P.O.:702; I.V.:2000; IV Piggyback:750] Out: 5175 [Urine:5100; Drains:50; Blood:25] Intake/Output from this shift: Total I/O In: 358 [P.O.:358] Out: 2200 [Urine:2200]  Labs: Recent Labs    05/09/17 2139 05/09/17 2220 05/10/17 0446 05/10/17 1510 05/10/17 1635 05/10/17 1648  WBC 13.3*  --  13.3*  --  12.9*  --   HGB 8.5*  --  7.9*  --  8.3*  --   HCT 27.0*  --  24.7*  --  25.8*  --   PLT 256  --  224  --  233  --   APTT  --  27  --   --   --  39*  CREATININE 0.67  --  0.72 0.73 0.77  --   MG  --   --   --   --  4.6*  --   ALBUMIN 2.8*  --  2.4* 2.3* 2.3*  --   PROT 6.3*  --  6.0* 5.0* 5.8*  --   AST 24  --  15 13* 14*  --   ALT 21  --  18 15 15   --   ALKPHOS 148*  --  124 118 121  --   BILITOT 0.6  --  0.4 0.4 0.9  --    Estimated Creatinine Clearance: 124.1 mL/min (by C-G formula based on SCr of 0.77 mg/dL).   Microbiology: Recent Results (from the past 720 hour(s))  Culture, OB Urine     Status: Abnormal   Collection Time: 04/18/17 10:05 AM  Result Value Ref Range Status   Specimen Description URINE, RANDOM  Final   Special Requests NONE  Final   Culture (A)  Final    2,000 COLONIES/mL GROUP B STREP(S.AGALACTIAE)ISOLATED TESTING AGAINST S. AGALACTIAE NOT ROUTINELY PERFORMED DUE TO PREDICTABILITY OF AMP/PEN/VAN SUSCEPTIBILITY. CRITICAL RESULT CALLED TO, READ BACK BY AND VERIFIED WITH: C MORRIS,RN AT 1316 04/19/17 BY L BENFIELD Performed at [facility], [address] [place], [place] [address]    Report Status  04/19/2017 FINAL  Final  Culture, blood (x 2)     Status: None (Preliminary result)   Collection Time: 05/09/17  9:41 PM  Result Value Ref Range Status   Specimen Description BLOOD RIGHT ARM  Final   Special Requests   Final    BOTTLES DRAWN AEROBIC AND ANAEROBIC Blood Culture adequate volume   Culture   Final    NO GROWTH 1 DAY Performed at [facility], [address] [place], [place] [address]    Report Status PENDING  Incomplete  Culture, blood (x 2)     Status: None (Preliminary result)   Collection Time: 05/09/17  9:41 PM  Result Value Ref Range Status   Specimen Description BLOOD RIGHT ARM  Final   Special Requests   Final    BOTTLES DRAWN AEROBIC AND ANAEROBIC Blood Culture adequate volume   Culture   Final    NO GROWTH 1 DAY Performed at [facility], [address] [place], [place] [address]    Report Status PENDING  Incomplete  Urine culture     Status: None   Collection Time: 05/09/17 10:50 PM  Result Value Ref Range Status   Specimen Description URINE, CLEAN CATCH  Final   Special Requests NONE  Final   Culture   Final    NO GROWTH Performed at [facility], [address] [place], [place] [address]    Report Status 05/11/2017 FINAL  Final  Aerobic/Anaerobic Culture (surgical/deep wound)     Status: None (Preliminary result)   Collection Time: 05/10/17  7:30 PM  Result Value Ref Range Status   Specimen Description ABDOMEN  Final   Special Requests C SEC WOUND  Final   Gram Stain   Final    MODERATE WBC PRESENT, PREDOMINANTLY PMN MODERATE GRAM POSITIVE COCCI IN PAIRS    Culture   Final    ABUNDANT STAPHYLOCOCCUS AUREUS CULTURE REINCUBATED FOR BETTER GROWTH Performed at [facility], [address] [place], [place] [address]    Report Status PENDING  Incomplete    Assessment: Pt on Vancomycin 1 gram IV q8h and Zosyn 3.375 gram IV q8h. Trough today prior to 1400 dose= 11 mcg/ml. Pt s/p debridement of wound 12/21.  Goal of  Therapy:  Vanc trough 10-15 due to change in indication from sepsis to wound infection  Plan:  Continue current regimen  Claybon Jabsngel, Drayson Dorko G 05/11/2017,6:02 PM

## 2017-05-11 NOTE — Progress Notes (Signed)
1 Day Post-Op Procedure(s) (LRB): DEBRIDEMENT ABDOMINAL WOUND (N/A)  Subjective: Patient reports feels dramatically better.   Hungry Getting up moving without difficulty  Objective: I have reviewed patient's vital signs, intake and output, medications, labs and microbiology.  Vitals:   05/10/17 2045 05/10/17 2100 05/10/17 2200 05/11/17 0319  BP:  136/66 (!) 149/76 134/76  Pulse:  98  98  Resp: (!) 22 20 20 20   Temp: 98.9 F (37.2 C) 98.8 F (37.1 C)  98.2 F (36.8 C)  TempSrc:  Oral Oral   SpO2:   99% 98%  Weight:      Height:         General: alert, cooperative and no distress GI: soft, non-tender; bowel sounds normal; no masses,  no organomegaly incision under vacuum pressure with 50 cc output since surgery, minimally tender  Assessment: s/p Procedure(s) with comments: DEBRIDEMENT ABDOMINAL WOUND (N/A) - physcian is requesting a wound vac: stable, progressing well and tolerating diet  Plan: Advance diet Encourage ambulation Continue ABX therapy due to Post-op infection: zosyn/Vancomycin Anticipate discharge home with wound vac in 48 hours if continues to be stable  LOS: 1 day    Jackie Chavez 05/11/2017, 7:30 AM

## 2017-05-11 NOTE — Plan of Care (Signed)
Pt is eating and drinking well. No N/V post surgery

## 2017-05-11 NOTE — Anesthesia Postprocedure Evaluation (Signed)
Anesthesia Post Note  Patient: Jackie Chavez  Procedure(s) Performed: DEBRIDEMENT ABDOMINAL WOUND (N/A Abdomen)     Patient location during evaluation: PACU Anesthesia Type: General Level of consciousness: awake and alert Pain management: pain level controlled Vital Signs Assessment: post-procedure vital signs reviewed and stable Respiratory status: spontaneous breathing, nonlabored ventilation, respiratory function stable and patient connected to nasal cannula oxygen Cardiovascular status: blood pressure returned to baseline and stable Postop Assessment: no apparent nausea or vomiting Anesthetic complications: no    Last Vitals:  Vitals:   05/10/17 2200 05/11/17 0319  BP: (!) 149/76 134/76  Pulse:  98  Resp: 20 20  Temp:  36.8 C  SpO2: 99% 98%    Last Pain:  Vitals:   05/11/17 0200  TempSrc:   PainSc: 0-No pain   Pain Goal: Patients Stated Pain Goal: 3 (05/10/17 0647)               Catheryn Baconyan P Ellender

## 2017-05-11 NOTE — Addendum Note (Signed)
Addendum  created 05/11/17 [id number] by Rica Recordsickelton, Berneda Piccininni, CRNA   Sign clinical note

## 2017-05-11 NOTE — Plan of Care (Signed)
Pt has an abdominal Wound Vacc

## 2017-05-11 NOTE — Progress Notes (Addendum)
Significant swelling noted to right labia during assessment of Patient. Patient denies any pain at or near the area. Patient states, "It's not bothering me, but it does itch a little bit. Not bad though." Call placed to Dr. Alysia PennaErvin. Dr. Alysia PennaErvin made aware of swelling noted to Patient's labia and Patient's complaint of itching. Telephone order received from Dr. Alysia PennaErvin to remove foley catheter. Patient made aware of new order and verbalizes an understanding.

## 2017-05-11 NOTE — Progress Notes (Signed)
Verbal order [facility].

## 2017-05-11 NOTE — Anesthesia Postprocedure Evaluation (Signed)
Anesthesia Post Note  Patient: Jackie Chavez  Procedure(s) Performed: DEBRIDEMENT ABDOMINAL WOUND (N/A Abdomen)     Patient location during evaluation: [facility] Unit Anesthesia Type: General Level of consciousness: awake and alert Pain management: pain level controlled Vital Signs Assessment: post-procedure vital signs reviewed and stable Respiratory status: spontaneous breathing, nonlabored ventilation, respiratory function stable and patient connected to nasal cannula oxygen Cardiovascular status: blood pressure returned to baseline and stable Postop Assessment: no apparent nausea or vomiting Anesthetic complications: no    Last Vitals:  Vitals:   05/11/17 0319 05/11/17 0834  BP: 134/76 (!) 152/84  Pulse: 98 100  Resp: 20 18  Temp: 36.8 C 36.9 C  SpO2: 98% 100%    Last Pain:  Vitals:   05/11/17 0834  TempSrc: Oral  PainSc:    Pain Goal: Patients Stated Pain Goal: 3 (05/10/17 [id number])               Rica RecordsICKELTON,[name]

## 2017-05-12 NOTE — Progress Notes (Signed)
HD # 2 POD # 1 wound debridement  Subjective: Pt reports feeling much better. Some burning and itching with wound vac. Up to restroom without problems. Tolerating diet. Pain controlled. Denies HA or visual changes.  Objective: AF  VSS Lungs clear Heart RRR Abd soft + BS wound vac in place Ext non tender  Assessment/Plan: POD # 1 wound debridement SPEC  Vasotec started yesterday. Afebrile and WBC down. Continue with Vanc/Zosyn. Blood and Urine cultures negative. Wound culture pending  Consult for case management to set up wound vac at home.   LOS: 2 days    Jackie StaggersMichael L Raiyah Chavez 05/12/2017, 7:23 AM    Patient ID: Jackie DesanctisJaximica M Chavez, female   DOB: 07/07/1995, 21 y.o.   MRN: [id number]

## 2017-05-12 NOTE — Progress Notes (Signed)
Call placed to Mikal PlaneLynette Gaines, RN Case Management. Message left with callback number.

## 2017-05-13 MED ORDER — SULFAMETHOXAZOLE-TRIMETHOPRIM 800-160 MG PO TABS: 1.0000 | ORAL_TABLET | Freq: Two times a day (BID) | ORAL | 0 refills | Status: DC

## 2017-05-13 MED ORDER — ENALAPRIL MALEATE 10 MG PO TABS: 10.0000 mg | ORAL_TABLET | Freq: Every day | ORAL | 1 refills | Status: DC

## 2017-05-13 MED ORDER — OXYCODONE-ACETAMINOPHEN 5-325 MG PO TABS: 1.0000 | ORAL_TABLET | ORAL | 0 refills | Status: DC | PRN

## 2017-05-13 MED ORDER — HYDROMORPHONE HCL 1 MG/ML IJ SOLN
1.0000 mg | Freq: Once | INTRAMUSCULAR | Status: AC
  Administered 2017-05-13: 1 mg via INTRAMUSCULAR

## 2017-05-13 MED ORDER — HYDROMORPHONE HCL 1 MG/ML IJ SOLN
INTRAMUSCULAR | Status: AC
  Administered 2017-05-13: 12:00:00
  Filled 2017-05-13: qty 1

## 2017-05-13 NOTE — Progress Notes (Signed)
Wound Care Nurse requests order for pain relief. Dr. Debroah LoopArnold is at the nurse's station at this time. Verbal order received from Dr. Debroah LoopArnold to give Patient Dilaudid 1 mg IM now.

## 2017-05-13 NOTE — Discharge Instructions (Signed)

## 2017-05-13 NOTE — Progress Notes (Signed)
All discharge teaching completed with the patient. All printed discharge instructions and prescriptions given to the patient. Patient verbalizes an understanding of all instructions. No questions or concerns verbalized at this time. Wound Care Nurse to see Patient before the patient leaves the hospital. Patient is aware and verbalizes an understanding.

## 2017-05-13 NOTE — Consult Note (Signed)
WOC Nurse wound consult note Reason for Consult:NpWT dressing change  Patient to DC to home today. Requested to change NPWT dressing.  [facility] to come and attach home NPWT device to patient at time of DC today Wound type: surgical /Csection  Pressure Injury POA: NA Measurement:3cm x 9cm x 2.0cm  Wound bed: clean , pink, moist Drainage (amount, consistency, odor) minimal in canister  Periwound: intact  Dressing procedure/placement/frequency: Attempted to remove old NPWT dressing, after soaking with saline continued to be difficult to remove and painful for the patient. Requested pain med 1x dose on OB on the floor.  Bedside nurse gave patient IM pain meds.  After a few minutes patient was able to tolerate removal of one piece of black foam.  1pc of black foam cut to fit wound bed, sealed at 125mmHg. Patient tolerated with slight discomfort.   Jackie Chavez [facility] MSN,RN,CWOCN, CNS, [employer] [phone]

## 2017-05-13 NOTE — Discharge Summary (Signed)
Physician Discharge Summary  Patient ID: Jackie Chavez MRN: [id number] DOB/AGE: 20/07/1995 20 y.o.  Admit date: 05/09/2017 Discharge date: 05/13/2017  Admission Diagnoses: Post operative wound infection  Discharge Diagnoses:  Active Problems:   Sepsis (HCC)   Cesarean wound infection   Hypertension in pregnancy, preeclampsia, severe, delivered/postpartum   Anemia, postpartum   S/P cesarean section   Discharged Condition: good  Hospital Course: pt was adnmitted 4 days post op from a stat C section with fever and leukocytosis Begun on Vanc/zosyn empirically CT scan was negative Began to appear to have a wound infection, fascia intact Taken to OR for wound debridement and wound vac packing Post op defervesced immediately and pt began to feel much better  Consults: None  Significant Diagnostic Studies: labs:at the time of discharge staph aureus ID pending, assume MRSA   Treatments: antibiotics: vancomycin and Zosyn and surgery: wound exploration  Discharge Exam: Blood pressure (!) 160/84, pulse 80, temperature 98.4 F (36.9 C), temperature source Oral, resp. rate 18, height 5\' 1"  (1.549 m), weight 228 lb 8 oz (103.6 kg), last menstrual period 09/06/2016, SpO2 98 %, unknown if currently breastfeeding. General appearance: alert, cooperative and no distress GI: soft, non-tender; bowel sounds normal; no masses,  no organomegaly Wound vac in place and working   Disposition: 01-Home or Self Care  Discharge Instructions    Call MD for:  persistant nausea and vomiting   Complete by:  As directed    Call MD for:  severe uncontrolled pain   Complete by:  As directed    Call MD for:  temperature >100.4   Complete by:  As directed    Diet - low sodium heart healthy   Complete by:  As directed    Discharge wound care:   Complete by:  As directed    Home Health is arranged for outpatient wound vac care   Increase activity slowly   Complete by:  As directed        Follow-up Information    [facility] Follow up on 05/13/2017.   Contact information: [address] [place] [phone]       [facility] Follow up on 05/17/2017.   Why:  BP check, wound assessment Contact information: [address] [place] [phone]          Signed: Lazaro ArmsLuther H Laymond Postle 05/13/2017, 9:30 AM

## 2017-05-13 NOTE — Care Management Note (Signed)
Case Management Note  Patient Details  Name: Jackie Chavez MRN: [id number] Date of Birth: 03/06/1996  Subjective/Objective:             s/p   Cesarean wound infection     Action/Plan: Home with negative pressure wound therapy with [facility]   Expected Discharge Date:  05/13/17               Expected Discharge Plan:  Home w Home Health Services      Discharge planning Services  CM Consult  Post Acute Care Choice:  Durable Medical Equipment, Home Health Choice offered to:  Patient  DME Arranged:  Negative pressure wound device DME Agency:  [facility]  HH Arranged:  RN HH Agency:  [facility]       Additional Comments: CM received voicemail from RN on pm of 05/12/17  notifying that patient needed wound therapy assistance prior to discharge on 05/13/17.  CM called Dr. Eure on am 05/13/17 see orders.  CM met with patient in room and offered choice.  Patient did not have choice and referral sent to [facility] # [phone] with Karen N.  Wound Form for [facility] faxed with confirmation after signature of Dr. James Arnold signed form to [facility]. Melanie Austin WOC RN with [facility] to change dressing today and Karen with [facility] to bring over DME- [facility] equipment prior to discharge today.  RN with [facility] will come out 3 x a week - for dressing changes ( MON/WED/FRI).  Phone number of [facility] and CM given to patient.  Patient will be staying at: [address], [place] [address].  Phone number for mother (Scarlet: [phone]) and Phone number for dad is ( Desmine is: [phone]).    Gaines, Lanette Brown, RN 05/13/2017, 11:47 AM  

## 2017-05-15 LAB — CULTURE, BLOOD (ROUTINE X 2)
Culture: NO GROWTH
Culture: NO GROWTH
Special Requests: ADEQUATE
Special Requests: ADEQUATE

## 2017-05-18 LAB — AEROBIC/ANAEROBIC CULTURE (SURGICAL/DEEP WOUND)

## 2017-05-18 LAB — AEROBIC/ANAEROBIC CULTURE W GRAM STAIN (SURGICAL/DEEP WOUND)

## 2017-05-22 ENCOUNTER — Telehealth: Payer: Self-pay

## 2017-05-22 NOTE — Telephone Encounter (Signed)
S/w home nurse Denyse AmassCorey and she stated that pt has declined wound vac the last 2 times, but wound is healing nicely. Nurse also stated that pt BP was 148/82, but she had not taken BP med yet. Messaged provider to inform.

## 2017-05-24 ENCOUNTER — Telehealth: Payer: Self-pay | Admitting: Pediatrics

## 2017-05-24 NOTE — Telephone Encounter (Signed)
Nurse reports pt's wound is healing nicely. She states she would like to order continued wet to dry dressing changes. She reports patient refused wound vac.  She also reports pt c/o slight headache, otherwise feeling fine.

## 2017-05-27 NOTE — Telephone Encounter (Signed)
That is fine to make those changes.

## 2017-05-28 ENCOUNTER — Telehealth: Payer: Self-pay | Admitting: *Deleted

## 2017-05-28 NOTE — Telephone Encounter (Signed)
[facility] regarding request to change orders from wound vac to Wet to Dry drsg. Nurse states that pt is changing drsg 2 x daily and is doing well. Nurse made aware that per JStinson note, ok to change orders to wet to dry drsg.

## 2017-05-29 ENCOUNTER — Telehealth: Payer: Self-pay

## 2017-05-29 NOTE — Telephone Encounter (Signed)
Left message for [facility] to return call

## 2017-05-29 NOTE — Telephone Encounter (Signed)
[facility] message yesterday afternoon stating that pt has been having some really bad headaches, dizziness, and nausea/vomiting. Kandee KeenCory states that all of patients vitals were within normal range during her visit. I called pt this morning to follow up with her on symptoms. Pt did not answer. Left vm for pt to return call

## 2017-05-30 ENCOUNTER — Telehealth: Payer: Self-pay

## 2017-05-30 NOTE — Telephone Encounter (Signed)
Returned call to Denyse Amassorey [facility] nurse), and advised that we reached out to the patient about her symptoms and there was no answer, left vm for her to call. Denyse AmassCorey stated that she was on her way to see the pt now and would have her call if symptoms have not changed.

## 2017-06-03 ENCOUNTER — Ambulatory Visit: Payer: Medicaid Other | Admitting: Obstetrics and Gynecology

## 2017-06-04 ENCOUNTER — Encounter: Payer: Self-pay | Admitting: *Deleted

## 2017-06-21 ENCOUNTER — Encounter: Payer: Self-pay | Admitting: *Deleted

## 2017-06-24 ENCOUNTER — Emergency Department (HOSPITAL_COMMUNITY): Payer: Medicaid Other

## 2017-06-24 ENCOUNTER — Other Ambulatory Visit: Payer: Self-pay

## 2017-06-24 ENCOUNTER — Emergency Department (HOSPITAL_COMMUNITY)
Admission: EM | Admit: 2017-06-24 | Discharge: 2017-06-24 | Discharge status: Against Medical Advice | Payer: Medicaid Other | Attending: Emergency Medicine | Admitting: Emergency Medicine

## 2017-06-24 ENCOUNTER — Encounter (HOSPITAL_COMMUNITY): Payer: Self-pay

## 2017-06-24 DIAGNOSIS — Z87891 Personal history of nicotine dependence: Secondary | ICD-10-CM | POA: Insufficient documentation

## 2017-06-24 DIAGNOSIS — K625 Hemorrhage of anus and rectum: Secondary | ICD-10-CM | POA: Diagnosis not present

## 2017-06-24 DIAGNOSIS — R1032 Left lower quadrant pain: Secondary | ICD-10-CM | POA: Insufficient documentation

## 2017-06-24 DIAGNOSIS — Z5320 Procedure and treatment not carried out because of patient's decision for unspecified reasons: Secondary | ICD-10-CM | POA: Diagnosis not present

## 2017-06-24 LAB — URINALYSIS, ROUTINE W REFLEX MICROSCOPIC
BILIRUBIN URINE: NEGATIVE
GLUCOSE, UA: NEGATIVE mg/dL
HGB URINE DIPSTICK: NEGATIVE
Ketones, ur: NEGATIVE mg/dL
Leukocytes, UA: NEGATIVE
Nitrite: NEGATIVE
PROTEIN: NEGATIVE mg/dL
SPECIFIC GRAVITY, URINE: 1.023 (ref 1.005–1.030)
pH: 6 (ref 5.0–8.0)

## 2017-06-24 LAB — COMPREHENSIVE METABOLIC PANEL
ALBUMIN: 4.3 g/dL (ref 3.5–5.0)
ALK PHOS: 108 U/L (ref 38–126)
ALT: 14 U/L (ref 14–54)
AST: 17 U/L (ref 15–41)
Anion gap: 7 (ref 5–15)
BUN: 18 mg/dL (ref 6–20)
CALCIUM: 9.7 mg/dL (ref 8.9–10.3)
CHLORIDE: 105 mmol/L (ref 101–111)
CO2: 27 mmol/L (ref 22–32)
CREATININE: 0.8 mg/dL (ref 0.44–1.00)
GFR calc Af Amer: >60 mL/min (ref >60)
GFR calc non Af Amer: >60 mL/min (ref >60)
GLUCOSE: 95 mg/dL (ref 65–99)
Potassium: 4.2 mmol/L (ref 3.5–5.1)
SODIUM: 139 mmol/L (ref 135–145)
Total Bilirubin: 0.1 mg/dL — ABNORMAL LOW (ref 0.3–1.2)
Total Protein: 7.8 g/dL (ref 6.5–8.1)

## 2017-06-24 LAB — CBC
HCT: 40.4 % (ref 36.0–46.0)
Hemoglobin: 12.7 g/dL (ref 12.0–15.0)
MCH: 27.2 pg (ref 26.0–34.0)
MCHC: 31.4 g/dL (ref 30.0–36.0)
MCV: 86.5 fL (ref 78.0–100.0)
PLATELETS: 305 10*3/uL (ref 150–400)
RBC: 4.67 MIL/uL (ref 3.87–5.11)
RDW: 17.5 % — ABNORMAL HIGH (ref 11.5–15.5)
WBC: 7.7 10*3/uL (ref 4.0–10.5)

## 2017-06-24 LAB — I-STAT BETA HCG BLOOD, ED (MC, WL, AP ONLY)

## 2017-06-24 LAB — POC OCCULT BLOOD, ED: Fecal Occult Bld: POSITIVE — AB

## 2017-06-24 LAB — LIPASE, BLOOD: LIPASE: 26 U/L (ref 11–51)

## 2017-06-24 MED ORDER — IOPAMIDOL (ISOVUE-300) INJECTION 61%
INTRAVENOUS | Status: AC
  Filled 2017-06-24: qty 100

## 2017-06-24 NOTE — ED Notes (Addendum)
Writer made two unsuccessful attempts to draw labs. 

## 2017-06-24 NOTE — ED Triage Notes (Signed)
States for one week abdominal pain and bright red rectal bleeding voiced.

## 2017-06-24 NOTE — ED Notes (Signed)
EDPA aware that the patient had to leave AMA due to babysitting issues.

## 2017-06-24 NOTE — ED Provider Notes (Signed)
[facility]-EMERGENCY DEPT Provider Note   CSN: [id number] Arrival date & time: 06/24/17  0120     History   Chief Complaint Chief Complaint  Patient presents with  . Abdominal Pain    HPI Jackie Chavez is a 22 y.o. female.  HPI Jackie Chavez is a 22 y.o. female presents to emergency department complaining of abdominal pain and rectal bleeding.  Patient had C-section delivery of twin girls month and a half ago, states her recovery was complicated by cesarean wound infection and was admitted for it 4 days postop.  Treated with antibiotics.  States she is feeling better from this infection, however about a week ago has developed lower abdominal pain, mainly on the left side, radiating into the left lower back.  States that she has also noticed bright red blood in her stool and a commode when she goes to the bathroom.  Denies bleeding unless she has a bowel movement.  She reports that her stools are solid, and she does have to strain a bit to have a bowel movement. she denies history of rectal bleeding in the past.  She does report history of hemorrhoid but states that she does not help one at this time.  She has not tried any treatment prior to coming in. Denies fever or chills. No n/v. Denies urinary or vaginal complaints  Past Medical History:  Diagnosis Date  . Anemia   . Anxiety   . BV (bacterial vaginosis)   . Headache    migraines  . Hx of chlamydia infection 10/17/2016  . Pregnancy induced hypertension     Patient Active Problem List   Diagnosis Date Noted  . Sepsis (HCC) 05/10/2017  . Cesarean wound infection 05/10/2017  . Hypertension in pregnancy, preeclampsia, severe, delivered/postpartum 05/10/2017  . Anemia, postpartum 05/10/2017  . S/P cesarean section 05/10/2017  . Encounter for induction of labor 05/04/2017  . Anemia affecting pregnancy 04/29/2017  . Twin gestation in third trimester 04/26/2017  . Preeclampsia, third trimester  04/26/2017  . Otitis externa, left 04/26/2017  . GBS bacteriuria 04/21/2017  . Dichorionic diamniotic twin pregnancy, antepartum 04/01/2017  . Transient hypertension of pregnancy 01/28/2017  . Rubella non-immune status, antepartum 01/07/2017  . Supervision of high risk pregnancy, antepartum 12/18/2016    Past Surgical History:  Procedure Laterality Date  . CESAREAN SECTION MULTI-GESTATIONAL N/A 05/05/2017   Procedure: CESAREAN SECTION MULTI-GESTATIONAL;  Surgeon: Catalina Antiguaonstant, Peggy, MD;  [facility]: [facility];  Service: Obstetrics;  Laterality: N/A;  . WOUND DEBRIDEMENT N/A 05/10/2017   Procedure: DEBRIDEMENT ABDOMINAL WOUND;  Surgeon: Lazaro ArmsEure, Luther H, MD;  Location: [facility];  Service: Gynecology;  Laterality: N/A;  physcian is requesting a wound vac    OB History    Gravida Para Term Preterm AB Living   1 1   1   2    SAB TAB Ectopic Multiple Live Births         1 2       Home Medications    Prior to Admission medications   Medication Sig Start Date End Date Taking? Authorizing Provider  amLODipine (NORVASC) 10 MG tablet Take 1 tablet (10 mg total) by mouth daily. 05/08/17   Levie HeritageStinson, Jacob J, DO  enalapril (VASOTEC) 10 MG tablet Take 1 tablet (10 mg total) by mouth daily. 05/13/17   Lazaro ArmsEure, Luther H, MD  Ferrous Fumarate (HEMOCYTE - 106 MG FE) 324 (106 Fe) MG TABS tablet Take 1 tablet (106 mg of iron total) by mouth  2 (two) times daily. 04/30/17   Levie Heritage, DO  ibuprofen (ADVIL,MOTRIN) 600 MG tablet Take 1 tablet (600 mg total) by mouth every 6 (six) hours as needed for mild pain. 05/08/17   Levie Heritage, DO  oxyCODONE-acetaminophen (PERCOCET/ROXICET) 5-325 MG tablet Take 1 tablet by mouth every 4 (four) hours as needed for moderate pain. 05/08/17   Levie Heritage, DO  oxyCODONE-acetaminophen (ROXICET) 5-325 MG tablet Take 1-2 tablets by mouth every 4 (four) hours as needed for severe pain. 05/13/17   [name], MD  senna-docusate (SENOKOT-S) 8.6-50 MG tablet  Take 2 tablets by mouth daily. 05/09/17   Levie Heritage, DO  sulfamethoxazole-trimethoprim (BACTRIM DS,SEPTRA DS) 800-160 MG tablet Take 1 tablet by mouth 2 (two) times daily. 05/13/17   [name], MD    Family History Family History  Problem Relation Age of Onset  . COPD Mother   . Diabetes Mother   . Mental illness Mother   . Alcohol abuse Mother   . Drug abuse Mother   . Diabetes Father   . Hypertension Father   . Alcohol abuse Father   . Drug abuse Father     Social History Social History   Tobacco Use  . Smoking status: Former Games developer  . Smokeless tobacco: Never Used  Substance Use Topics  . Alcohol use: No  . Drug use: No     Allergies   Patient has no known allergies.   Review of Systems Review of Systems  Constitutional: Negative for chills and fever.  Respiratory: Negative for cough, chest tightness and shortness of breath.   Cardiovascular: Negative for chest pain, palpitations and leg swelling.  Gastrointestinal: Positive for abdominal pain, blood in stool and constipation. Negative for diarrhea, nausea and vomiting.  Genitourinary: Negative for dysuria, flank pain, pelvic pain, vaginal bleeding, vaginal discharge and vaginal pain.  Musculoskeletal: Negative for arthralgias, myalgias, neck pain and neck stiffness.  Skin: Negative for rash.  Neurological: Negative for dizziness, weakness and headaches.  All other systems reviewed and are negative.    Physical Exam Updated Vital Signs BP 104/88 (BP Location: Left Arm)   Pulse 81   Temp 97.9 F (36.6 C) (Oral)   Resp 16   Ht 5\' 5"  (1.651 m)   Wt 103.4 kg (228 lb)   LMP 09/06/2016   SpO2 100%   BMI 37.94 kg/m   Physical Exam  Constitutional: She appears well-developed and well-nourished. No distress.  HENT:  Head: Normocephalic.  Eyes: Conjunctivae are normal.  Neck: Neck supple.  Cardiovascular: Normal rate, regular rhythm and normal heart sounds.  Pulmonary/Chest: Effort normal and  breath sounds normal. No respiratory distress. She has no wheezes. She has no rales.  Abdominal: Soft. Bowel sounds are normal. She exhibits no distension. There is tenderness. There is no rebound.  Left lower quadrant tenderness  Genitourinary:  Genitourinary Comments: Small external hemorrhoid, soft, not bleeding  Musculoskeletal: She exhibits no edema.  Neurological: She is alert.  Skin: Skin is warm and dry.  Psychiatric: She has a normal mood and affect. Her behavior is normal.  Nursing note and vitals reviewed.    ED Treatments / Results  Labs (all labs ordered are listed, but only abnormal results are displayed) Labs Reviewed  COMPREHENSIVE METABOLIC PANEL - Abnormal; Notable for the following components:      Result Value   Total Bilirubin 0.1 (*)    All other components within normal limits  CBC - Abnormal; Notable for the  following components:   RDW 17.5 (*)    All other components within normal limits  URINALYSIS, ROUTINE W REFLEX MICROSCOPIC - Abnormal; Notable for the following components:   APPearance HAZY (*)    All other components within normal limits  POC OCCULT BLOOD, ED - Abnormal; Notable for the following components:   Fecal Occult Bld POSITIVE (*)    All other components within normal limits  LIPASE, BLOOD  I-STAT BETA HCG BLOOD, ED ([facility], [facility], [facility] ONLY)    EKG  EKG Interpretation None       Radiology No results found.  Procedures Procedures (including critical care time)  Medications Ordered in ED Medications - No data to display   Initial Impression / Assessment and Plan / ED Course  I have reviewed the triage vital signs and the nursing notes.  Pertinent labs & imaging results that were available during my care of the patient were reviewed by me and considered in my medical decision making (see chart for details).     Patient with abdominal pain and rectal bleeding, recent C-section with secondary wound infection.  Patient does have heme  positive stools.  She does have one small hemorrhoid to the rectum which is not bleeding at this time.  We will get labs and CT abdomen pelvis to rule out colitis.   Patient requested to be discharged home, states that the person not staying with her twins had to leave and she must get to them.  Advised to follow-up with family doctor.  Return precautions discussed.  Left ama   Vitals:   06/24/17 0144 06/24/17 0401 06/24/17 0634 06/24/17 0654  BP:  110/62 104/88   Pulse:  72 81   Resp:  14 16   Temp:      TempSrc:      SpO2:  100% 100% 100%  Weight: 103.4 kg (228 lb)     Height: 5\' 5"  (1.651 m)        Final Clinical Impressions(s) / ED Diagnoses   Final diagnoses:  Left lower quadrant pain  Rectal bleeding    ED Discharge Orders    None       Jaynie Crumble, PA-C 06/24/17 1609    Cathren Laine, MD 06/25/17 [phone]

## 2017-08-21 ENCOUNTER — Encounter: Payer: Self-pay | Admitting: *Deleted

## 2017-08-26 ENCOUNTER — Emergency Department (HOSPITAL_COMMUNITY)
Admission: EM | Admit: 2017-08-26 | Discharge: 2017-08-27 | Disposition: A | Discharge status: Home / Self Care | Payer: Medicaid Other | Attending: Pediatric Emergency Medicine | Admitting: Pediatric Emergency Medicine

## 2017-08-26 ENCOUNTER — Encounter (HOSPITAL_COMMUNITY): Payer: Self-pay | Admitting: Emergency Medicine

## 2017-08-26 DIAGNOSIS — Z87891 Personal history of nicotine dependence: Secondary | ICD-10-CM | POA: Insufficient documentation

## 2017-08-26 DIAGNOSIS — B3731 Acute candidiasis of vulva and vagina: Secondary | ICD-10-CM

## 2017-08-26 DIAGNOSIS — Z79899 Other long term (current) drug therapy: Secondary | ICD-10-CM | POA: Insufficient documentation

## 2017-08-26 DIAGNOSIS — B373 Candidiasis of vulva and vagina: Secondary | ICD-10-CM

## 2017-08-26 DIAGNOSIS — N390 Urinary tract infection, site not specified: Secondary | ICD-10-CM

## 2017-08-26 LAB — URINALYSIS, ROUTINE W REFLEX MICROSCOPIC
Bilirubin Urine: NEGATIVE
GLUCOSE, UA: NEGATIVE mg/dL
HGB URINE DIPSTICK: NEGATIVE
Ketones, ur: NEGATIVE mg/dL
Nitrite: NEGATIVE
PROTEIN: NEGATIVE mg/dL
SPECIFIC GRAVITY, URINE: 1.02 (ref 1.005–1.030)
pH: 6 (ref 5.0–8.0)

## 2017-08-26 LAB — PREGNANCY, URINE: PREG TEST UR: NEGATIVE

## 2017-08-26 MED ORDER — FLUCONAZOLE 200 MG PO TABS: ORAL_TABLET | ORAL | 0 refills | Status: DC

## 2017-08-26 MED ORDER — CEPHALEXIN 500 MG PO CAPS: 500.0000 mg | ORAL_CAPSULE | Freq: Two times a day (BID) | ORAL | 0 refills | Status: AC

## 2017-08-26 MED ORDER — FLUCONAZOLE 150 MG PO TABS
150.0000 mg | ORAL_TABLET | Freq: Once | ORAL | Status: AC
  Administered 2017-08-27: 150 mg via ORAL
  Filled 2017-08-26: qty 1

## 2017-08-26 MED ORDER — CEPHALEXIN 500 MG PO CAPS
500.0000 mg | ORAL_CAPSULE | Freq: Once | ORAL | Status: AC
  Administered 2017-08-27: 500 mg via ORAL
  Filled 2017-08-26: qty 1

## 2017-08-26 NOTE — ED Provider Notes (Signed)
[facility] EMERGENCY DEPARTMENT Provider Note   CSN: [id number] Arrival date & time: 08/26/17  2250     History   Chief Complaint Chief Complaint  Patient presents with  . Possible Pregnancy    HPI Jackie Chavez is a 22 y.o. female.  Patient reports that she has had a very light 2 days of spotting 2 weeks ago instead of her normal period.  He has had unprotected sexual intercourse and is concerned that she may be pregnant.  She denies any abdominal pain or vaginal bleeding.  She also reports that she has had some dysuria in the last several days but denies vaginal discharge or pain.  The history is provided by the patient. No language interpreter was used.  Possible Pregnancy  This is a new problem. The current episode started more than 1 week ago. The problem occurs constantly. The problem has not changed since onset.Pertinent negatives include no chest pain, no abdominal pain, no headaches and no shortness of breath. Nothing aggravates the symptoms. Nothing relieves the symptoms. She has tried nothing for the symptoms. The treatment provided no relief.    Past Medical History:  Diagnosis Date  . Anemia   . Anxiety   . BV (bacterial vaginosis)   . Headache    migraines  . Hx of chlamydia infection 10/17/2016  . Pregnancy induced hypertension     Patient Active Problem List   Diagnosis Date Noted  . Sepsis (HCC) 05/10/2017  . Cesarean wound infection 05/10/2017  . Hypertension in pregnancy, preeclampsia, severe, delivered/postpartum 05/10/2017  . Anemia, postpartum 05/10/2017  . S/P cesarean section 05/10/2017  . Encounter for induction of labor 05/04/2017  . Anemia affecting pregnancy 04/29/2017  . Twin gestation in third trimester 04/26/2017  . Preeclampsia, third trimester 04/26/2017  . Otitis externa, left 04/26/2017  . GBS bacteriuria 04/21/2017  . Dichorionic diamniotic twin pregnancy, antepartum 04/01/2017  . Transient hypertension of  pregnancy 01/28/2017  . Rubella non-immune status, antepartum 01/07/2017  . Supervision of high risk pregnancy, antepartum 12/18/2016    Past Surgical History:  Procedure Laterality Date  . CESAREAN SECTION MULTI-GESTATIONAL N/A 05/05/2017   Procedure: CESAREAN SECTION MULTI-GESTATIONAL;  Surgeon: Catalina Antigua, MD;  Location: [facility];  Service: Obstetrics;  Laterality: N/A;  . WOUND DEBRIDEMENT N/A 05/10/2017   Procedure: DEBRIDEMENT ABDOMINAL WOUND;  Surgeon: [name], MD;  Location: [facility];  Service: Gynecology;  Laterality: N/A;  physcian is requesting a wound vac     OB History    Gravida  1   Para  1   Term      Preterm  1   AB      Living  2     SAB      TAB      Ectopic      Multiple  1   Live Births  2            Home Medications    Prior to Admission medications   Medication Sig Start Date End Date Taking? Authorizing Provider  amLODipine (NORVASC) 10 MG tablet Take 1 tablet (10 mg total) by mouth daily. Patient not taking: Reported on 06/24/2017 05/08/17   Levie Heritage, DO  cephALEXin (KEFLEX) 500 MG capsule Take 1 capsule (500 mg total) by mouth 2 (two) times daily for 5 days. 08/26/17 08/31/17  Sharene Skeans, MD  enalapril (VASOTEC) 10 MG tablet Take 1 tablet (10 mg total) by mouth daily. Patient not taking: Reported  on 06/24/2017 05/13/17   Lazaro ArmsEure, Luther H, MD  Ferrous Fumarate (HEMOCYTE - 106 MG FE) 324 (106 Fe) MG TABS tablet Take 1 tablet (106 mg of iron total) by mouth 2 (two) times daily. Patient not taking: Reported on 06/24/2017 04/30/17   Levie HeritageStinson, Jacob J, DO  fluconazole (DIFLUCAN) 200 MG tablet Take one tablet by mouth in 7 days. 08/26/17   Sharene SkeansBaab, Lyzbeth Genrich, MD  ibuprofen (ADVIL,MOTRIN) 600 MG tablet Take 1 tablet (600 mg total) by mouth every 6 (six) hours as needed for mild pain. Patient not taking: Reported on 06/24/2017 05/08/17   Levie HeritageStinson, Jacob J, DO  oxyCODONE-acetaminophen (PERCOCET/ROXICET) 5-325 MG tablet Take 1 tablet by mouth  every 4 (four) hours as needed for moderate pain. Patient not taking: Reported on 06/24/2017 05/08/17   Levie HeritageStinson, Jacob J, DO  oxyCODONE-acetaminophen (ROXICET) 5-325 MG tablet Take 1-2 tablets by mouth every 4 (four) hours as needed for severe pain. Patient not taking: Reported on 06/24/2017 05/13/17   Lazaro ArmsEure, Luther H, MD  senna-docusate (SENOKOT-S) 8.6-50 MG tablet Take 2 tablets by mouth daily. Patient not taking: Reported on 06/24/2017 05/09/17   Levie HeritageStinson, Jacob J, DO  sulfamethoxazole-trimethoprim (BACTRIM DS,SEPTRA DS) 800-160 MG tablet Take 1 tablet by mouth 2 (two) times daily. Patient not taking: Reported on 06/24/2017 05/13/17   Lazaro ArmsEure, Luther H, MD    Family History Family History  Problem Relation Age of Onset  . COPD Mother   . Diabetes Mother   . Mental illness Mother   . Alcohol abuse Mother   . Drug abuse Mother   . Diabetes Father   . Hypertension Father   . Alcohol abuse Father   . Drug abuse Father     Social History Social History   Tobacco Use  . Smoking status: Former Games developermoker  . Smokeless tobacco: Never Used  Substance Use Topics  . Alcohol use: No  . Drug use: No     Allergies   Coconut flavor and Latex   Review of Systems Review of Systems  Respiratory: Negative for shortness of breath.   Cardiovascular: Negative for chest pain.  Gastrointestinal: Negative for abdominal pain.  Neurological: Negative for headaches.  All other systems reviewed and are negative.    Physical Exam Updated Vital Signs BP (!) 142/74 (BP Location: Left Arm)   Pulse 84   Temp 98.4 F (36.9 C) (Oral)   Resp 18   SpO2 98%   Physical Exam  Constitutional: She appears well-developed and well-nourished.  HENT:  Head: Normocephalic.  Eyes: Conjunctivae are normal.  Neck: Normal range of motion.  Cardiovascular: Normal rate and regular rhythm.  Pulmonary/Chest: Effort normal and breath sounds normal. No respiratory distress.  Abdominal: Soft. She exhibits no distension.  There is no tenderness.  Musculoskeletal: Normal range of motion.  Neurological: She is alert.  Skin: Skin is warm and dry. Capillary refill takes less than 2 seconds.  Nursing note and vitals reviewed.    ED Treatments / Results  Labs (all labs ordered are listed, but only abnormal results are displayed) Labs Reviewed  URINALYSIS, ROUTINE W REFLEX MICROSCOPIC - Abnormal; Notable for the following components:      Result Value   APPearance HAZY (*)    Leukocytes, UA LARGE (*)    Bacteria, UA RARE (*)    Squamous Epithelial / LPF 6-30 (*)    All other components within normal limits  URINE CULTURE  PREGNANCY, URINE    EKG None  Radiology No results found.  Procedures  Procedures (including critical care time)  Medications Ordered in ED Medications  fluconazole (DIFLUCAN) tablet 150 mg (has no administration in time range)  cephALEXin (KEFLEX) capsule 500 mg (has no administration in time range)     Initial Impression / Assessment and Plan / ED Course  I have reviewed the triage vital signs and the nursing notes.  Pertinent labs & imaging results that were available during my care of the patient were reviewed by me and considered in my medical decision making (see chart for details).     22 y.o. concerned that she is pregnant as well as having some dysuria.  Urinalysis here is a clean catch and has some white cells but is nitrite negative.  Given that she has some dysuria will treat for UTI.  Urine pregnancy negative here.  Patient concerned because she has in the past had urinary tract infections and subsequently always gets a yeast infection so is requesting to be treated concomitantly for yeast infection.  Diflucan here and a prescription for another tablet in a week.  Discussed specific signs and symptoms of concern for which they should return to ED.  Discharge with close follow up with primary care physician if no better in next 2 days.  Patient comfortable with this  plan of care.   Final Clinical Impressions(s) / ED Diagnoses   Final diagnoses:  Vaginal yeast infection  Urinary tract infection without hematuria, site unspecified    ED Discharge Orders        Ordered    cephALEXin (KEFLEX) 500 MG capsule  2 times daily     08/26/17 2342    fluconazole (DIFLUCAN) 200 MG tablet     08/26/17 7829       Sharene Skeans, MD 08/26/17 2346

## 2017-08-26 NOTE — ED Triage Notes (Addendum)
Pt arrives with requesting a pregnancy test done. Nad. LMP 2 weeks ago but have been very irregular but only lasted about 2 days, normally last over a week. sts has been having d/c, nausea, hot flashes, cramping into lower back/lower abd. sts has had some itching- sts has hx of getting yeast infections

## 2017-08-26 NOTE — ED Notes (Signed)
ED Provider at bedside. 

## 2017-08-28 LAB — URINE CULTURE

## 2017-11-28 ENCOUNTER — Encounter (HOSPITAL_COMMUNITY): Payer: Self-pay | Admitting: Emergency Medicine

## 2017-11-28 ENCOUNTER — Ambulatory Visit (HOSPITAL_COMMUNITY)
Admission: EM | Admit: 2017-11-28 | Discharge: 2017-11-28 | Disposition: A | Discharge status: Home / Self Care | Payer: Medicaid Other | Attending: Family Medicine | Admitting: Family Medicine

## 2017-11-28 DIAGNOSIS — Z87891 Personal history of nicotine dependence: Secondary | ICD-10-CM | POA: Insufficient documentation

## 2017-11-28 DIAGNOSIS — Z3202 Encounter for pregnancy test, result negative: Secondary | ICD-10-CM

## 2017-11-28 DIAGNOSIS — Z79899 Other long term (current) drug therapy: Secondary | ICD-10-CM | POA: Insufficient documentation

## 2017-11-28 DIAGNOSIS — Z7251 High risk heterosexual behavior: Secondary | ICD-10-CM

## 2017-11-28 DIAGNOSIS — N898 Other specified noninflammatory disorders of vagina: Secondary | ICD-10-CM | POA: Insufficient documentation

## 2017-11-28 DIAGNOSIS — Z8249 Family history of ischemic heart disease and other diseases of the circulatory system: Secondary | ICD-10-CM | POA: Insufficient documentation

## 2017-11-28 DIAGNOSIS — Z113 Encounter for screening for infections with a predominantly sexual mode of transmission: Secondary | ICD-10-CM

## 2017-11-28 DIAGNOSIS — Z202 Contact with and (suspected) exposure to infections with a predominantly sexual mode of transmission: Secondary | ICD-10-CM

## 2017-11-28 LAB — POCT PREGNANCY, URINE: Preg Test, Ur: NEGATIVE

## 2017-11-28 MED ORDER — CEFTRIAXONE SODIUM 250 MG IJ SOLR
INTRAMUSCULAR | Status: AC
  Filled 2017-11-28: qty 250

## 2017-11-28 MED ORDER — FLUORESCEIN SODIUM 1 MG OP STRP
ORAL_STRIP | OPHTHALMIC | Status: AC
  Filled 2017-11-28: qty 1

## 2017-11-28 MED ORDER — METRONIDAZOLE 500 MG PO TABS: 500.0000 mg | ORAL_TABLET | Freq: Two times a day (BID) | ORAL | 0 refills | Status: DC

## 2017-11-28 MED ORDER — AZITHROMYCIN 250 MG PO TABS
1000.0000 mg | ORAL_TABLET | Freq: Once | ORAL | Status: AC
  Administered 2017-11-28: 1000 mg via ORAL

## 2017-11-28 MED ORDER — LIDOCAINE HCL (PF) 1 % IJ SOLN
INTRAMUSCULAR | Status: AC
  Filled 2017-11-28: qty 2

## 2017-11-28 MED ORDER — CEFTRIAXONE SODIUM 250 MG IJ SOLR
250.0000 mg | Freq: Once | INTRAMUSCULAR | Status: AC
  Administered 2017-11-28: 250 mg via INTRAMUSCULAR

## 2017-11-28 MED ORDER — AZITHROMYCIN 250 MG PO TABS
ORAL_TABLET | ORAL | Status: AC
  Filled 2017-11-28: qty 4

## 2017-11-28 MED ORDER — FLUCONAZOLE 200 MG PO TABS: ORAL_TABLET | ORAL | 0 refills | Status: DC

## 2017-11-28 NOTE — ED Provider Notes (Signed)
[facility]   [id number] 11/28/17 Arrival Time: 1604   SUBJECTIVE:  Jackie Chavez is a 22 y.o. female who presents with complaints of vaginal discharge and odor that began 1 week ago.  Last unproteced sexual encounter 2-3 weeks ago.  Patient is sexually active with #1 of female partner.  States partner had been sleeping with multiple partners.  Describes discharge as thick/ thin and white.  She has tried monistat with temporary relief.  She denies worsening symptoms.  She reports similar symptoms in the past and was diagnosed with BV and yeast.  She denies fever, chills, nausea, vomiting, abdominal or pelvic pain, urinary symptoms, vaginal bleeding, vaginal rashes or lesions.   Patient's last menstrual period was 11/21/2017.  ROS: As per HPI.  Past Medical History:  Diagnosis Date  . Anemia   . Anxiety   . BV (bacterial vaginosis)   . Headache    migraines  . Hx of chlamydia infection 10/17/2016  . Pregnancy induced hypertension    Past Surgical History:  Procedure Laterality Date  . CESAREAN SECTION MULTI-GESTATIONAL N/A 05/05/2017   Procedure: CESAREAN SECTION MULTI-GESTATIONAL;  Surgeon: Catalina Antigua, MD;  Location: [facility];  Service: Obstetrics;  Laterality: N/A;  . WOUND DEBRIDEMENT N/A 05/10/2017   Procedure: DEBRIDEMENT ABDOMINAL WOUND;  Surgeon: [name], MD;  Location: [facility];  Service: Gynecology;  Laterality: N/A;  physcian is requesting a wound vac   Allergies  Allergen Reactions  . Coconut Flavor   . Latex Rash   No current facility-administered medications on file prior to encounter.    Current Outpatient Medications on File Prior to Encounter  Medication Sig Dispense Refill  . amLODipine (NORVASC) 10 MG tablet Take 1 tablet (10 mg total) by mouth daily. (Patient not taking: Reported on 06/24/2017) 30 tablet 1  . enalapril (VASOTEC) 10 MG tablet Take 1 tablet (10 mg total) by mouth daily. (Patient not taking: Reported on 06/24/2017)  30 tablet 1  . Ferrous Fumarate (HEMOCYTE - 106 MG FE) 324 (106 Fe) MG TABS tablet Take 1 tablet (106 mg of iron total) by mouth 2 (two) times daily. (Patient not taking: Reported on 06/24/2017) 30 tablet 2  . ibuprofen (ADVIL,MOTRIN) 600 MG tablet Take 1 tablet (600 mg total) by mouth every 6 (six) hours as needed for mild pain. (Patient not taking: Reported on 06/24/2017) 30 tablet 0  . oxyCODONE-acetaminophen (PERCOCET/ROXICET) 5-325 MG tablet Take 1 tablet by mouth every 4 (four) hours as needed for moderate pain. (Patient not taking: Reported on 06/24/2017) 20 tablet 0  . oxyCODONE-acetaminophen (ROXICET) 5-325 MG tablet Take 1-2 tablets by mouth every 4 (four) hours as needed for severe pain. (Patient not taking: Reported on 06/24/2017) 30 tablet 0  . senna-docusate (SENOKOT-S) 8.6-50 MG tablet Take 2 tablets by mouth daily. (Patient not taking: Reported on 06/24/2017) 60 tablet 1  . sulfamethoxazole-trimethoprim (BACTRIM DS,SEPTRA DS) 800-160 MG tablet Take 1 tablet by mouth 2 (two) times daily. (Patient not taking: Reported on 06/24/2017) 20 tablet 0    Social History   Socioeconomic History  . Marital status: Single    Spouse name: Not on file  . Number of children: Not on file  . Years of education: Not on file  . Highest education level: Not on file  Occupational History  . Not on file  Social Needs  . Financial resource strain: Not on file  . Food insecurity:    Worry: Not on file    Inability: Not on  file  . Transportation needs:    Medical: Not on file    Non-medical: Not on file  Tobacco Use  . Smoking status: Former Games developermoker  . Smokeless tobacco: Never Used  Substance and Sexual Activity  . Alcohol use: No  . Drug use: No  . Sexual activity: Yes    Comment: currently pregnant  Lifestyle  . Physical activity:    Days per week: Not on file    Minutes per session: Not on file  . Stress: Not on file  Relationships  . Social connections:    Talks on phone: Not on file    Gets  together: Not on file    Attends religious service: Not on file    Active member of club or organization: Not on file    Attends meetings of clubs or organizations: Not on file    Relationship status: Not on file  . Intimate partner violence:    Fear of current or ex partner: Not on file    Emotionally abused: Not on file    Physically abused: Not on file    Forced sexual activity: Not on file  Other Topics Concern  . Not on file  Social History Narrative  . Not on file   Family History  Problem Relation Age of Onset  . COPD Mother   . Diabetes Mother   . Mental illness Mother   . Alcohol abuse Mother   . Drug abuse Mother   . Diabetes Father   . Hypertension Father   . Alcohol abuse Father   . Drug abuse Father     OBJECTIVE:  Vitals:   11/28/17 1636  BP: (!) 141/84  Pulse: 76  Resp: 18  Temp: 98.1 F (36.7 C)  SpO2: 100%     General appearance: alert, cooperative, appears stated age and no distress Throat: lips, mucosa, and tongue normal; teeth and gums normal Lungs: CTA bilaterally without adventitious breath sounds Heart: low grade murmur patient aware.  Radial pulses 2+ symmetrical bilaterally Back: no CVA tenderness Abdomen: soft, non-tender; bowel sounds normal; no masses or organomegaly; no guarding or rebound tenderness GU: declines vaginal self-swab obtained Skin: warm and dry Psychological:  Alert and cooperative. Normal mood and affect.  LABS:   No results found for this or any previous visit (from the past 24 hour(s)).  Labs Reviewed  HIV ANTIBODY (ROUTINE TESTING)  RPR  CERVICOVAGINAL ANCILLARY ONLY    ASSESSMENT & PLAN:  1. Vaginal discharge   2. Unprotected sex     Meds ordered this encounter  Medications  . cefTRIAXone (ROCEPHIN) injection 250 mg  . azithromycin (ZITHROMAX) tablet 1,000 mg  . metroNIDAZOLE (FLAGYL) 500 MG tablet    Sig: Take 1 tablet (500 mg total) by mouth 2 (two) times daily.    Dispense:  14 tablet     Refill:  0    Order Specific Question:   Supervising Provider    Answer:   Isa RankinMURRAY, LAURA WILSON [id number]]  . fluconazole (DIFLUCAN) 200 MG tablet    Sig: Take one tablet by mouth, wait 72 hours, then take second tablet by mouth    Dispense:  2 tablet    Refill:  0    Order Specific Question:   Supervising Provider    Answer:   Isa RankinMURRAY, LAURA WILSON [[id number]]    Pending: Labs Reviewed  HIV ANTIBODY (ROUTINE TESTING)  RPR  CERVICOVAGINAL ANCILLARY ONLY   UPT negative Given rocephin 250mg  injection and azithromycin 1g in office  vaginal self swab obtained HIV/ syphilis blood test obtained Prescribed metronidazole 500 mg twice daily for 7 days (do not take while consuming alcohol or if breast feeding) Prescribed diflucan 200 mg once daily and then second dose 72 hours later Take medications as prescribed and to completion We will follow up with you regarding the results of your test If tests are positive, please abstain from sexual activity for at least 7 days and notify partners Follow up with PCP if symptoms persists Return here or go to ER if you have any new or worsening symptoms    Reviewed expectations re: course of current medical issues. Questions answered. Outlined signs and symptoms indicating need for more acute intervention. Patient verbalized understanding. After Visit Summary given.       Rennis Harding, PA-C 11/28/17 1828

## 2017-11-28 NOTE — Discharge Instructions (Addendum)
UPT was negative Given rocephin 250mg  injection and azithromycin 1g in office vaginal self swab obtained HIV/ syphilis blood test obtained Prescribed metronidazole 500 mg twice daily for 7 days (do not take while consuming alcohol or if breast feeding) Prescribed diflucan 200 mg once daily and then second dose 72 hours later Take medications as prescribed and to completion We will follow up with you regarding the results of your test If tests are positive, please abstain from sexual activity for at least 7 days and notify partners Follow up with PCP if symptoms persists Return here or go to ER if you have any new or worsening symptoms

## 2017-11-28 NOTE — ED Notes (Signed)
Pt discharged by provider.

## 2017-11-28 NOTE — ED Triage Notes (Signed)
Pt c/o vaginal discharge 

## 2017-11-29 LAB — HIV ANTIBODY (ROUTINE TESTING W REFLEX): HIV SCREEN 4TH GENERATION: NONREACTIVE

## 2017-11-29 LAB — CERVICOVAGINAL ANCILLARY ONLY
Bacterial vaginitis: POSITIVE — AB
Candida vaginitis: POSITIVE — AB
Chlamydia: NEGATIVE
Neisseria Gonorrhea: NEGATIVE
Trichomonas: POSITIVE — AB

## 2017-11-29 LAB — RPR: RPR Ser Ql: NONREACTIVE

## 2017-12-02 ENCOUNTER — Telehealth (HOSPITAL_COMMUNITY): Payer: Self-pay

## 2017-12-02 NOTE — Telephone Encounter (Signed)
Bacterial Vaginosis test is positive.  Prescription for metronidazole was given at the urgent care visit.  Pt contacted regarding test for candida (yeast) was positive.    Trichomonas is positive. Rx metronidazole was given at the urgent care visit.  Attempted to reach patient. No answer at this time. Voicemail left.

## 2017-12-03 ENCOUNTER — Telehealth (HOSPITAL_COMMUNITY): Payer: Self-pay

## 2017-12-03 NOTE — Telephone Encounter (Signed)
Attempted to reach patient x 2 

## 2017-12-05 ENCOUNTER — Telehealth (HOSPITAL_COMMUNITY): Payer: Self-pay

## 2017-12-05 NOTE — Telephone Encounter (Signed)
Attempted to reach patient x 3. Letter sent. 

## 2018-04-14 IMAGING — CT CT ABD-PELV W/ CM
1 of 3 series · 13 of 32 positions shown, 18 images · IV contrast (OMNIPAQUE)
Comparison: None.

CLINICAL DATA: Pain and tenderness with fever. Cesarian section 4
days prior

EXAM:
CT ABDOMEN AND PELVIS WITH CONTRAST
TECHNIQUE: Multidetector CT imaging of the abdomen and pelvis was performed
using the standard protocol following bolus administration of
intravenous contrast. Oral contrast was also administered.
CONTRAST:  100mL F5GT5E-BUU IOPAMIDOL (F5GT5E-BUU) INJECTION 61%

[Series 2: routine abdomen/pelvis with · axial · 0.87mm/px · z∈[+752,+1207]mm · 13 of 103 slices shown, 18 images]
[im 6/103  soft-tissue]
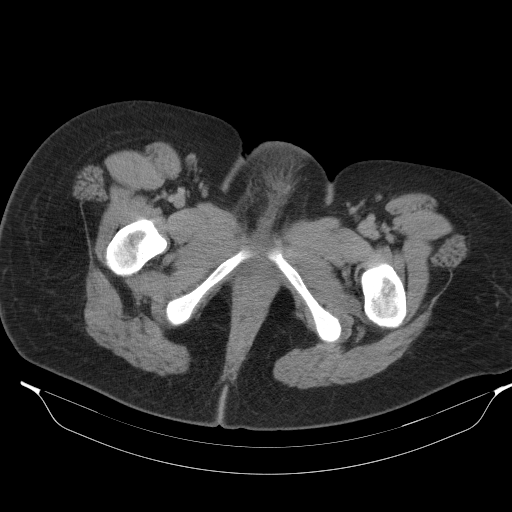
[im 6/103  bone]
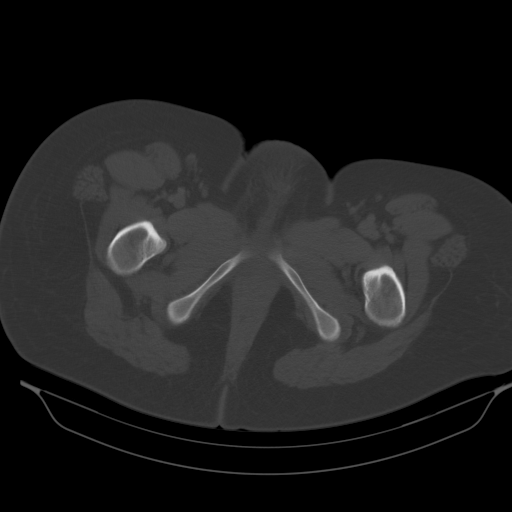
[im 17/103  soft-tissue]
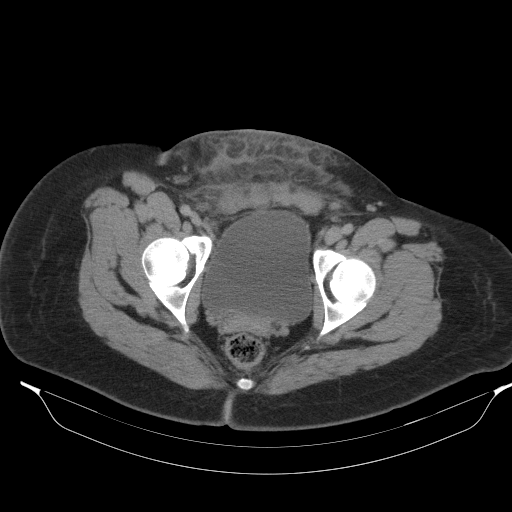
[im 22/103  soft-tissue]
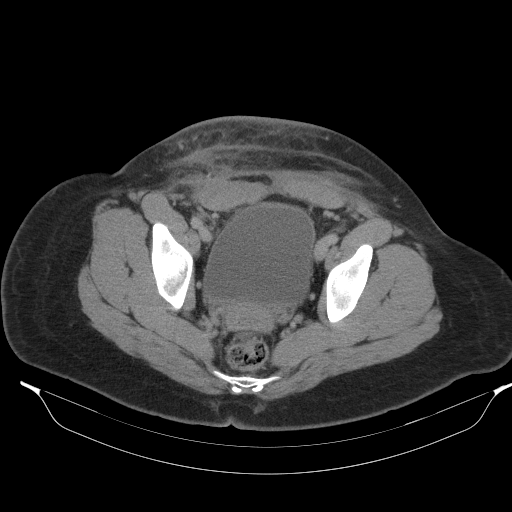
[im 33/103  soft-tissue]
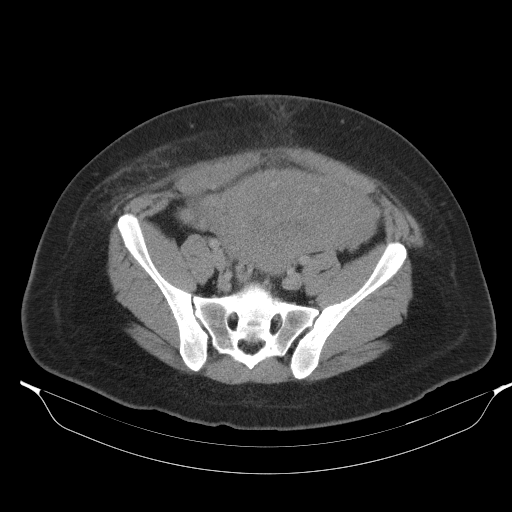
[im 38/103  soft-tissue]
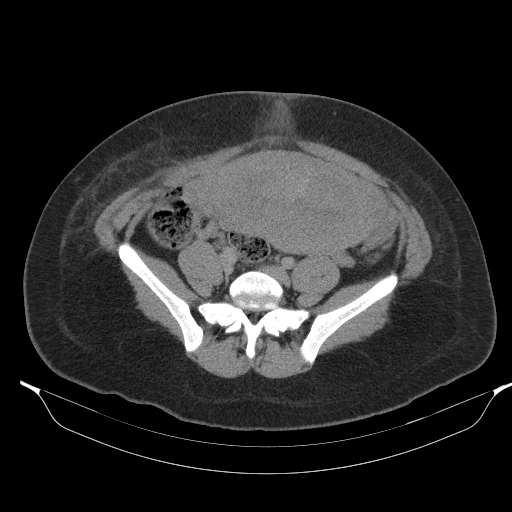
[im 49/103  soft-tissue]
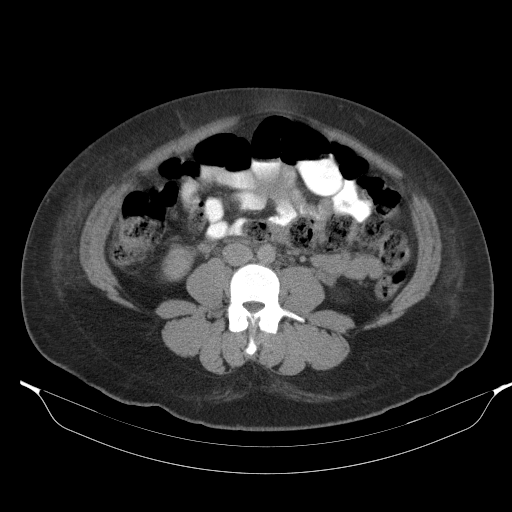
[im 54/103  soft-tissue]
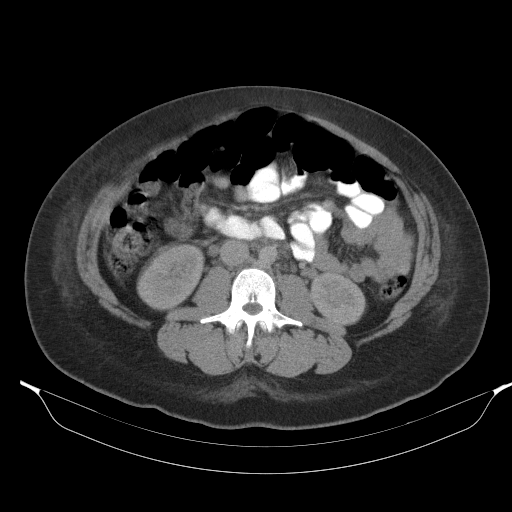
[im 65/103  soft-tissue]
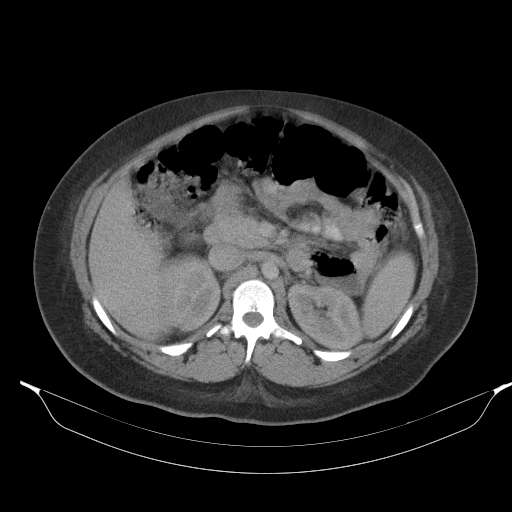
[im 70/103  soft-tissue]
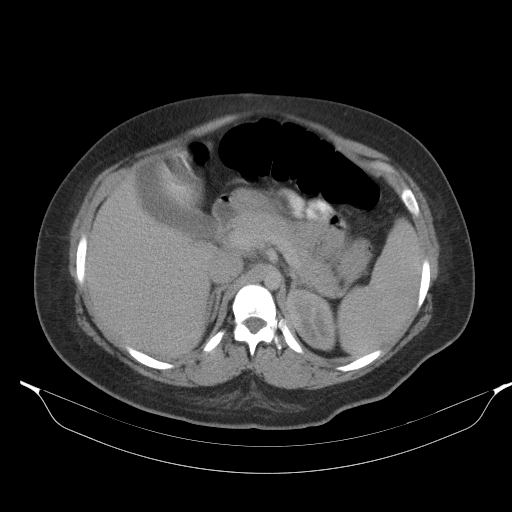
[im 70/103  bone]
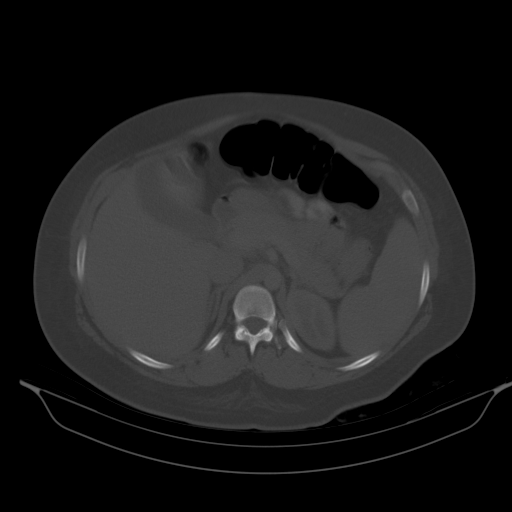
[im 81/103  soft-tissue]
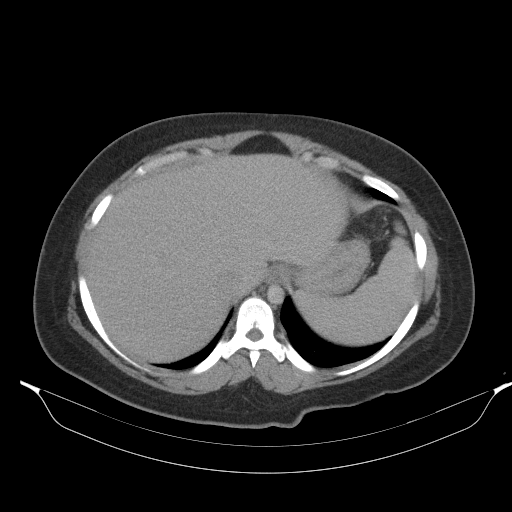
[im 81/103  lung]
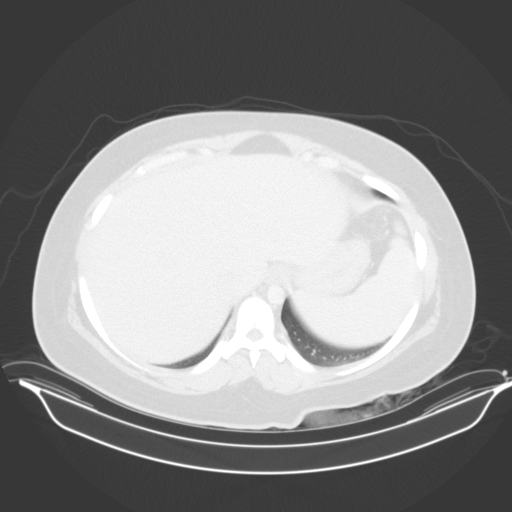
[im 86/103  soft-tissue]
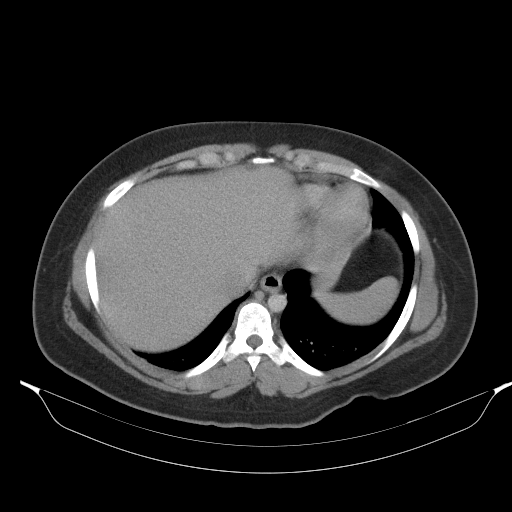
[im 86/103  lung]
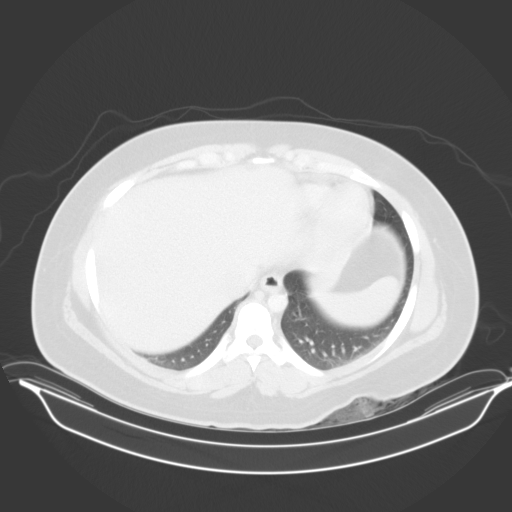
[im 92/103  lung]
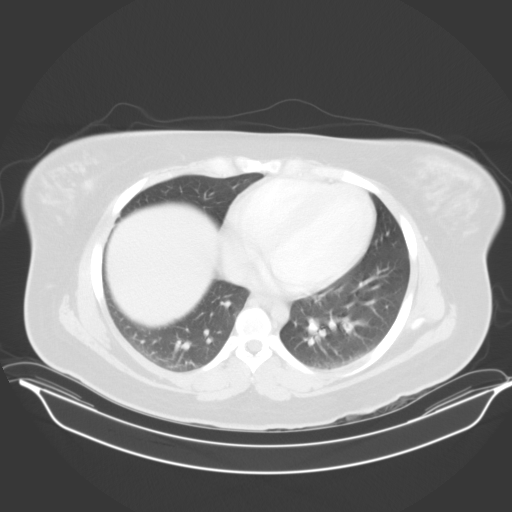
[im 97/103  soft-tissue]
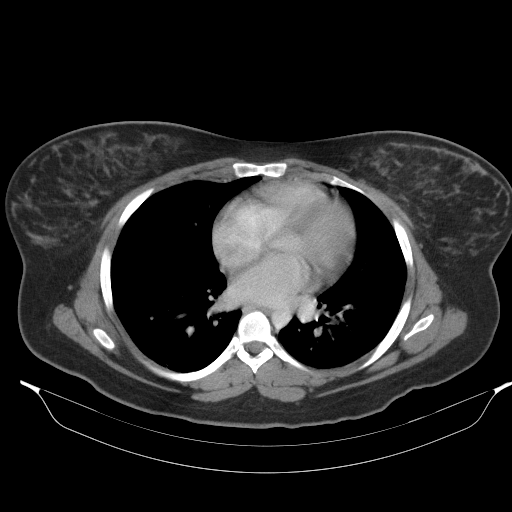
[im 97/103  lung]
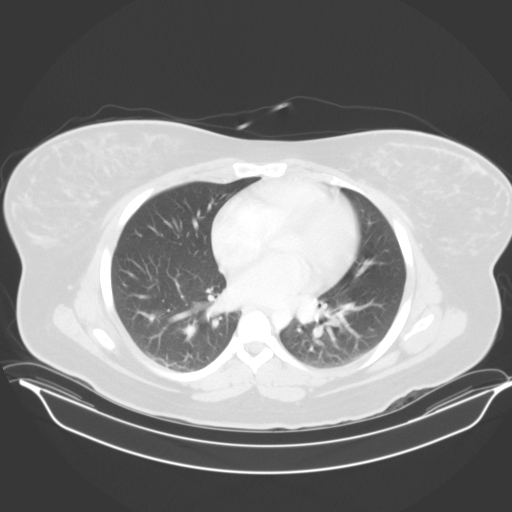

[13 of 32 positions shown; findings below may reference images not displayed]

FINDINGS: Lower chest: Lung bases are clear.

Hepatobiliary: No focal liver lesions are appreciable. Gallbladder
is borderline distended without wall thickening. No biliary duct
dilatation.

Pancreas: No pancreatic mass or inflammatory focus.

Spleen: Spleen measures 13.3 x 14.1 x 8.4 cm with a measured splenic
volume of 788 cubic cm. No focal splenic lesions evident.

Adrenals/Urinary Tract: Adrenals bilaterally appear normal. Kidneys
bilaterally show no evident mass or hydronephrosis on either side.
There is no renal or ureteral calculus on either side. Urinary
bladder is midline with wall thickness within normal limits. No
contrast extravasation is appreciable on this study.

Stomach/Bowel: There is no appreciable bowel wall or mesenteric
thickening. No oral contrast extravasation is demonstrable on this
study. There is no free air or portal venous air evident.

Vascular/Lymphatic: No abdominal aortic aneurysm. No vascular
lesions are evident. There is a circumaortic left renal vein, an
anatomic variant. There is no appreciable adenopathy in the abdomen
or pelvis.

Reproductive: Uterus is diffusely enlarged consistent with recent
pregnancy. The contents of the endometrium appear somewhat
inhomogeneous. There may well be hemorrhage within the endometrium.
There is no appreciable intrauterine air. There is soft tissue
stranding anterior to the uterus consistent with recent surgery. A
trace amount of air is seen in this area, felt to be of
postoperative etiology. Adnexal structures appear unremarkable.

Other: Appendix is not appreciable. No periappendiceal region
inflammation is evident on this study peer no abscess or ascites is
evident on this study.

Musculoskeletal: There is postoperative change in the lower
abdominal and anterior pelvic wall regions without fluid collection
or abscess in the abdominal wall. There are no blastic or lytic bone
lesions. There is no intramuscular lesion.
IMPRESSION: 1. Uterus enlarged, consistent with recent pregnancy. The
endometrium appears thickened and somewhat irregular in contour.
This finding may be a consequence of recent cesarian section. A
degree of endometritis cannot be excluded by CT. No areas seen
within the endometrium or within the uterus.

2. No free air. No ascites. No oral or intravenous contrast
extravasation evident.

3. Postoperative change in the lower abdomen and pelvic wall. A tiny
focus of air in this area is felt to be of postoperative etiology.

4. Splenomegaly of uncertain etiology. No focal splenic lesions
evident.

5. No evident bowel obstruction. Appendix not seen. No
periappendiceal region inflammation evident.

6.  No renal or ureteral calculus.  No hydronephrosis.

## 2018-05-21 NOTE — L&D Delivery Note (Signed)
Delivery Note At 2:40 PM a viable female was delivered via VBAC, Spontaneous (Presentation: LOA).  APGAR: 9, 9; weight pending.   Placenta status: spontaneous, intact.  Cord: 3 vessels  Anesthesia:  epidural Episiotomy: None Lacerations: 2nd degree;Perineal Suture Repair: 3.0 vicryl Est. Blood Loss (mL):  450  Mom to postpartum.  Baby to Couplet care / Skin to Skin.  [name] CNM 02/21/2019, 3:11 PM

## 2018-06-30 ENCOUNTER — Emergency Department (HOSPITAL_COMMUNITY)
Admission: EM | Admit: 2018-06-30 | Discharge: 2018-06-30 | Disposition: A | Discharge status: Home / Self Care | Payer: Medicaid Other | Attending: Emergency Medicine | Admitting: Emergency Medicine

## 2018-06-30 ENCOUNTER — Emergency Department (HOSPITAL_COMMUNITY): Payer: Medicaid Other

## 2018-06-30 ENCOUNTER — Encounter (HOSPITAL_COMMUNITY): Payer: Self-pay | Admitting: *Deleted

## 2018-06-30 DIAGNOSIS — Z79899 Other long term (current) drug therapy: Secondary | ICD-10-CM | POA: Insufficient documentation

## 2018-06-30 DIAGNOSIS — Z91040 Latex allergy status: Secondary | ICD-10-CM | POA: Insufficient documentation

## 2018-06-30 DIAGNOSIS — R102 Pelvic and perineal pain: Secondary | ICD-10-CM | POA: Insufficient documentation

## 2018-06-30 DIAGNOSIS — Z3A01 Less than 8 weeks gestation of pregnancy: Secondary | ICD-10-CM | POA: Insufficient documentation

## 2018-06-30 DIAGNOSIS — Z87891 Personal history of nicotine dependence: Secondary | ICD-10-CM | POA: Insufficient documentation

## 2018-06-30 DIAGNOSIS — Z3491 Encounter for supervision of normal pregnancy, unspecified, first trimester: Secondary | ICD-10-CM

## 2018-06-30 DIAGNOSIS — A599 Trichomoniasis, unspecified: Secondary | ICD-10-CM | POA: Insufficient documentation

## 2018-06-30 DIAGNOSIS — O23591 Infection of other part of genital tract in pregnancy, first trimester: Secondary | ICD-10-CM | POA: Insufficient documentation

## 2018-06-30 LAB — URINALYSIS, ROUTINE W REFLEX MICROSCOPIC
Bacteria, UA: NONE SEEN
Bilirubin Urine: NEGATIVE
Glucose, UA: NEGATIVE mg/dL
Hgb urine dipstick: NEGATIVE
Ketones, ur: NEGATIVE mg/dL
Nitrite: NEGATIVE
Protein, ur: NEGATIVE mg/dL
Specific Gravity, Urine: 1.016 (ref 1.005–1.030)
pH: 7 (ref 5.0–8.0)

## 2018-06-30 LAB — WET PREP, GENITAL
Clue Cells Wet Prep HPF POC: NONE SEEN
Sperm: NONE SEEN
Yeast Wet Prep HPF POC: NONE SEEN

## 2018-06-30 LAB — HCG, QUANTITATIVE, PREGNANCY: hCG, Beta Chain, Quant, S: 30166 m[IU]/mL — ABNORMAL HIGH (ref <5)

## 2018-06-30 LAB — POC URINE PREG, ED: Preg Test, Ur: POSITIVE — AB

## 2018-06-30 MED ORDER — CEFTRIAXONE SODIUM 250 MG IJ SOLR
250.0000 mg | Freq: Once | INTRAMUSCULAR | Status: AC
  Administered 2018-06-30: 250 mg via INTRAMUSCULAR
  Filled 2018-06-30: qty 250

## 2018-06-30 MED ORDER — AZITHROMYCIN 250 MG PO TABS
1000.0000 mg | ORAL_TABLET | Freq: Once | ORAL | Status: AC
  Administered 2018-06-30: 1000 mg via ORAL
  Filled 2018-06-30: qty 4

## 2018-06-30 MED ORDER — LIDOCAINE HCL (PF) 1 % IJ SOLN
INTRAMUSCULAR | Status: AC
  Administered 2018-06-30: 1.2 mL
  Filled 2018-06-30: qty 5

## 2018-06-30 MED ORDER — PRENATAL COMPLETE 14-0.4 MG PO TABS: 1.0000 | ORAL_TABLET | Freq: Every day | ORAL | 0 refills | Status: DC

## 2018-06-30 MED ORDER — METRONIDAZOLE 500 MG PO TABS
2000.0000 mg | ORAL_TABLET | Freq: Once | ORAL | Status: AC
  Administered 2018-06-30: 2000 mg via ORAL
  Filled 2018-06-30: qty 4

## 2018-06-30 NOTE — ED Notes (Signed)
Patient transported to Ultrasound 

## 2018-06-30 NOTE — ED Provider Notes (Signed)
[facility] EMERGENCY DEPARTMENT Provider Note   CSN: [id number] Arrival date & time: 06/30/18  1120     History   Chief Complaint Chief Complaint  Patient presents with  . Possible Pregnancy    HPI Jackie Chavez is a 23 y.o. female.  HPI   89 YOF G2P1A0 female presents today with reports of pregnancy.  Patient notes that her last normal menstrual cycle was approximately 3 months ago.  She notes 14 months ago she gave birth to twins in which she did have preeclampsia.  Patient notes that her menstrual cycles were regular up until 3 months ago.  She notes she took a pregnancy test approximately 1 month ago that showed she was not pregnant.  She reports bilateral pelvic cramping and pain, also notes vaginal discharge, denies any vaginal bleeding.  Past Medical History:  Diagnosis Date  . Anemia   . Anxiety   . BV (bacterial vaginosis)   . Headache    migraines  . Hx of chlamydia infection 10/17/2016  . Pregnancy induced hypertension     Patient Active Problem List   Diagnosis Date Noted  . Sepsis (HCC) 05/10/2017  . Cesarean wound infection 05/10/2017  . Hypertension in pregnancy, preeclampsia, severe, delivered/postpartum 05/10/2017  . Anemia, postpartum 05/10/2017  . S/P cesarean section 05/10/2017  . Encounter for induction of labor 05/04/2017  . Anemia affecting pregnancy 04/29/2017  . Twin gestation in third trimester 04/26/2017  . Preeclampsia, third trimester 04/26/2017  . Otitis externa, left 04/26/2017  . GBS bacteriuria 04/21/2017  . Dichorionic diamniotic twin pregnancy, antepartum 04/01/2017  . Transient hypertension of pregnancy 01/28/2017  . Rubella non-immune status, antepartum 01/07/2017  . Supervision of high risk pregnancy, antepartum 12/18/2016    Past Surgical History:  Procedure Laterality Date  . CESAREAN SECTION MULTI-GESTATIONAL N/A 05/05/2017   Procedure: CESAREAN SECTION MULTI-GESTATIONAL;  Surgeon: Catalina Antigua,  MD;  Location: [facility];  Service: Obstetrics;  Laterality: N/A;  . WOUND DEBRIDEMENT N/A 05/10/2017   Procedure: DEBRIDEMENT ABDOMINAL WOUND;  Surgeon: [name], MD;  Location: [facility];  Service: Gynecology;  Laterality: N/A;  physcian is requesting a wound vac     OB History    Gravida  1   Para  1   Term      Preterm  1   AB      Living  2     SAB      TAB      Ectopic      Multiple  1   Live Births  2            Home Medications    Prior to Admission medications   Medication Sig Start Date End Date Taking? Authorizing Provider  amLODipine (NORVASC) 10 MG tablet Take 1 tablet (10 mg total) by mouth daily. Patient not taking: Reported on 06/24/2017 05/08/17   Levie Heritage, DO  enalapril (VASOTEC) 10 MG tablet Take 1 tablet (10 mg total) by mouth daily. Patient not taking: Reported on 06/24/2017 05/13/17   [name], MD  Ferrous Fumarate (HEMOCYTE - 106 MG FE) 324 (106 Fe) MG TABS tablet Take 1 tablet (106 mg of iron total) by mouth 2 (two) times daily. Patient not taking: Reported on 06/24/2017 04/30/17   Levie Heritage, DO  fluconazole (DIFLUCAN) 200 MG tablet Take one tablet by mouth, wait 72 hours, then take second tablet by mouth 11/28/17   Wurst, [place], PA-C  ibuprofen (ADVIL,MOTRIN) 600  MG tablet Take 1 tablet (600 mg total) by mouth every 6 (six) hours as needed for mild pain. Patient not taking: Reported on 06/24/2017 05/08/17   Levie HeritageStinson, Jacob J, DO  metroNIDAZOLE (FLAGYL) 500 MG tablet Take 1 tablet (500 mg total) by mouth 2 (two) times daily. 11/28/17   Wurst, GrenadaBrittany, PA-C  oxyCODONE-acetaminophen (PERCOCET/ROXICET) 5-325 MG tablet Take 1 tablet by mouth every 4 (four) hours as needed for moderate pain. Patient not taking: Reported on 06/24/2017 05/08/17   Levie HeritageStinson, Jacob J, DO  oxyCODONE-acetaminophen (ROXICET) 5-325 MG tablet Take 1-2 tablets by mouth every 4 (four) hours as needed for severe pain. Patient not taking: Reported on  06/24/2017 05/13/17   Lazaro ArmsEure, Luther H, MD  Prenatal Vit-Fe Fumarate-FA (PRENATAL COMPLETE) 14-0.4 MG TABS Take 1 tablet by mouth daily. 06/30/18   Asuka Dusseau, Tinnie GensJeffrey, PA-C  senna-docusate (SENOKOT-S) 8.6-50 MG tablet Take 2 tablets by mouth daily. Patient not taking: Reported on 06/24/2017 05/09/17   Levie HeritageStinson, Jacob J, DO  sulfamethoxazole-trimethoprim (BACTRIM DS,SEPTRA DS) 800-160 MG tablet Take 1 tablet by mouth 2 (two) times daily. Patient not taking: Reported on 06/24/2017 05/13/17   Lazaro ArmsEure, Luther H, MD    Family History Family History  Problem Relation Age of Onset  . COPD Mother   . Diabetes Mother   . Mental illness Mother   . Alcohol abuse Mother   . Drug abuse Mother   . Diabetes Father   . Hypertension Father   . Alcohol abuse Father   . Drug abuse Father     Social History Social History   Tobacco Use  . Smoking status: Former Games developermoker  . Smokeless tobacco: Never Used  Substance Use Topics  . Alcohol use: No  . Drug use: No     Allergies   Coconut flavor and Latex   Review of Systems Review of Systems  All other systems reviewed and are negative.    Physical Exam Updated Vital Signs BP 140/62 (BP Location: Right Arm)   Pulse 92   Temp 98.5 F (36.9 C) (Oral)   Resp 20   SpO2 100%   Physical Exam Vitals signs and nursing note reviewed.  Constitutional:      Appearance: She is well-developed.  HENT:     Head: Normocephalic and atraumatic.  Eyes:     General: No scleral icterus.       Right eye: No discharge.        Left eye: No discharge.     Conjunctiva/sclera: Conjunctivae normal.     Pupils: Pupils are equal, round, and reactive to light.  Neck:     Musculoskeletal: Normal range of motion.     Vascular: No JVD.     Trachea: No tracheal deviation.  Pulmonary:     Effort: Pulmonary effort is normal.     Breath sounds: No stridor.  Abdominal:     Comments: Abdomen soft nontender, no pelvic tenderness nongravid abdomen  Genitourinary:    Comments:  No significant vaginal discharge noted, no cervical motion tenderness Neurological:     Mental Status: She is alert and oriented to person, place, and time.     Coordination: Coordination normal.  Psychiatric:        Behavior: Behavior normal.        Thought Content: Thought content normal.        Judgment: Judgment normal.      ED Treatments / Results  Labs (all labs ordered are listed, but only abnormal results are displayed) Labs Reviewed  WET PREP, GENITAL - Abnormal; Notable for the following components:      Result Value   Trich, Wet Prep PRESENT (*)    WBC, Wet Prep HPF POC FEW (*)    All other components within normal limits  HCG, QUANTITATIVE, PREGNANCY - Abnormal; Notable for the following components:   hCG, Beta Chain, Quant, S 30,166 (*)    All other components within normal limits  URINALYSIS, ROUTINE W REFLEX MICROSCOPIC - Abnormal; Notable for the following components:   Leukocytes, UA TRACE (*)    All other components within normal limits  POC URINE PREG, ED - Abnormal; Notable for the following components:   Preg Test, Ur POSITIVE (*)    All other components within normal limits  HIV ANTIBODY (ROUTINE TESTING W REFLEX)  RPR  GC/CHLAMYDIA PROBE AMP ([place]    EKG None  Radiology [place] Ob Comp < 14 Wks  Result Date: 06/30/2018 CLINICAL DATA:  Pelvic pain and pregnant patient. EXAM: OBSTETRIC <14 WK US AND TRANSVAGINAL OB US TECHNIQUE: Both transabdominal and transvaginal ultrasound examinations were performed for complete evaluation of the gestation as well as the maternal uterus, adnexal regions, and pelvic cul-de-sac. Transvaginal technique was performed to assess early pregnancy. COMPARISON:  None. FINDINGS: Intrauterine gestational sac: Single Yolk sac:  Visualized. Embryo:  Visualized. Cardiac Activity: Visualized. Heart Rate: Cardiac activity could be seen on cine images but rate could not be accurately measured. MSD:   mm    w     d CRL:   3.1 mm   5 w   6 d Subchorionic hemorrhage:  A small subchorionic hemorrhage is noted. Maternal uterus/adnexae: Probable 2.1 cm corpus luteum cyst on the right. The ovaries are otherwise normal. IMPRESSION: 1. Single live IUP. 2. Probable corpus luteum cyst in the right ovary. 3. Small subchorionic hemorrhage. Electronically Signed   By: Gerome Samavid  Williams III M.D   On: 06/30/2018 13:58   [place] Ob Transvaginal  Result Date: 06/30/2018 CLINICAL DATA:  Pelvic pain and pregnant patient. EXAM: OBSTETRIC <14 WK US AND TRANSVAGINAL OB US TECHNIQUE: Both transabdominal and transvaginal ultrasound examinations were performed for complete evaluation of the gestation as well as the maternal uterus, adnexal regions, and pelvic cul-de-sac. Transvaginal technique was performed to assess early pregnancy. COMPARISON:  None. FINDINGS: Intrauterine gestational sac: Single Yolk sac:  Visualized. Embryo:  Visualized. Cardiac Activity: Visualized. Heart Rate: Cardiac activity could be seen on cine images but rate could not be accurately measured. MSD:   mm    w     d CRL:  3.1 mm   5 w   6 d Subchorionic hemorrhage:  A small subchorionic hemorrhage is noted. Maternal uterus/adnexae: Probable 2.1 cm corpus luteum cyst on the right. The ovaries are otherwise normal. IMPRESSION: 1. Single live IUP. 2. Probable corpus luteum cyst in the right ovary. 3. Small subchorionic hemorrhage. Electronically Signed   By: Gerome Samavid  Williams III M.D   On: 06/30/2018 13:58    Procedures Procedures (including critical care time)  Medications Ordered in ED Medications  metroNIDAZOLE (FLAGYL) tablet 2,000 mg (has no administration in time range)  azithromycin (ZITHROMAX) tablet 1,000 mg (has no administration in time range)  cefTRIAXone (ROCEPHIN) injection 250 mg (has no administration in time range)     Initial Impression / Assessment and Plan / ED Course  I have reviewed the triage vital signs and the nursing notes.  Pertinent labs & imaging  results that were available during my care  of the patient were reviewed by me and considered in my medical decision making (see chart for details).     Labs: hCG quant, RPR, HIV, GC, wet prep  Imaging: Ultrasound pelvis  Consults:  Therapeutics: Metronidazole, azithromycin, ceftriaxone  Discharge Meds: Prenatal  Assessment/Plan: 23 year old female presents today with early pregnancy.  Patient is approximately [redacted] weeks pregnant.  She is positive for trichomoniasis.  She notes that her boyfriend has been cheating on her.  She will be treated for STDs here.  She is encouraged to follow-up with OB/GYN for ongoing management of her pregnancy.  She is given strict return precautions, she verbalized understanding and agreement to today's plan.   Final Clinical Impressions(s) / ED Diagnoses   Final diagnoses:  First trimester pregnancy  Trichimoniasis    ED Discharge Orders         Ordered    Prenatal Vit-Fe Fumarate-FA (PRENATAL COMPLETE) 14-0.4 MG TABS  Daily     06/30/18 1444           Eyvonne Mechanic, PA-C 06/30/18 1445    Arby Barrette, MD 07/04/18 1310

## 2018-06-30 NOTE — Discharge Instructions (Addendum)
Please read attached information. If you experience any new or worsening signs or symptoms please return to the emergency room for evaluation. Please follow-up with your primary care provider or specialist as discussed. Please use medication prescribed only as directed and discontinue taking if you have any concerning signs or symptoms.   °

## 2018-06-30 NOTE — ED Notes (Signed)
Pelvic cart at bedside. 

## 2018-06-30 NOTE — ED Triage Notes (Signed)
Pt in stating she took a home pregnancy test last night and it was positive, came in to see how far along she is

## 2018-07-01 LAB — GC/CHLAMYDIA PROBE AMP (~~LOC~~) NOT AT ARMC
Chlamydia: NEGATIVE
Neisseria Gonorrhea: NEGATIVE

## 2018-07-01 LAB — RPR: RPR Ser Ql: NONREACTIVE

## 2018-07-01 LAB — HIV ANTIBODY (ROUTINE TESTING W REFLEX): HIV Screen 4th Generation wRfx: NONREACTIVE

## 2018-07-08 ENCOUNTER — Inpatient Hospital Stay (HOSPITAL_COMMUNITY)
Admission: AD | Admit: 2018-07-08 | Discharge: 2018-07-08 | Disposition: A | Discharge status: Home / Self Care | Payer: Medicaid Other | Attending: Family Medicine | Admitting: Family Medicine

## 2018-07-08 ENCOUNTER — Encounter (HOSPITAL_COMMUNITY): Payer: Self-pay | Admitting: *Deleted

## 2018-07-08 ENCOUNTER — Inpatient Hospital Stay (HOSPITAL_COMMUNITY): Payer: Medicaid Other

## 2018-07-08 DIAGNOSIS — O209 Hemorrhage in early pregnancy, unspecified: Secondary | ICD-10-CM

## 2018-07-08 DIAGNOSIS — O468X1 Other antepartum hemorrhage, first trimester: Secondary | ICD-10-CM

## 2018-07-08 DIAGNOSIS — O418X10 Other specified disorders of amniotic fluid and membranes, first trimester, not applicable or unspecified: Secondary | ICD-10-CM

## 2018-07-08 DIAGNOSIS — Z3A01 Less than 8 weeks gestation of pregnancy: Secondary | ICD-10-CM | POA: Insufficient documentation

## 2018-07-08 DIAGNOSIS — Z87891 Personal history of nicotine dependence: Secondary | ICD-10-CM | POA: Insufficient documentation

## 2018-07-08 DIAGNOSIS — O208 Other hemorrhage in early pregnancy: Secondary | ICD-10-CM | POA: Insufficient documentation

## 2018-07-08 LAB — URINALYSIS, ROUTINE W REFLEX MICROSCOPIC
Bilirubin Urine: NEGATIVE
Glucose, UA: NEGATIVE mg/dL
Hgb urine dipstick: NEGATIVE
Ketones, ur: NEGATIVE mg/dL
Leukocytes,Ua: NEGATIVE
NITRITE: NEGATIVE
Protein, ur: NEGATIVE mg/dL
Specific Gravity, Urine: 1.025 (ref 1.005–1.030)
pH: 6.5 (ref 5.0–8.0)

## 2018-07-08 NOTE — MAU Note (Signed)
Pt reports bleeding off/on , cramping.

## 2018-07-08 NOTE — Discharge Instructions (Signed)
Subchorionic Hematoma  A subchorionic hematoma is a gathering of blood between the outer wall of the embryo (chorion) and the inner wall of the womb (uterus). This condition can cause vaginal bleeding. If they cause little or no vaginal bleeding, early small hematomas usually shrink on their own and do not affect your baby or pregnancy. When bleeding starts later in pregnancy, or if the hematoma is larger or occurs in older pregnant women, the condition may be more serious. Larger hematomas may get bigger, which increases the chances of miscarriage. This condition also increases the risk of:  Premature separation of the placenta from the uterus.  Premature (preterm) labor.  Stillbirth. What are the causes? The exact cause of this condition is not known. It occurs when blood is trapped between the placenta and the uterine wall because the placenta has separated from the original site of implantation. What increases the risk? You are more likely to develop this condition if:  You were treated with fertility medicines.  You conceived through in vitro fertilization (IVF). What are the signs or symptoms? Symptoms of this condition include:  Vaginal spotting or bleeding.  Contractions of the uterus. These cause abdominal pain. Sometimes you may have no symptoms and the bleeding may only be seen when ultrasound images are taken (transvaginal ultrasound). How is this diagnosed? This condition is diagnosed based on a physical exam. This includes a pelvic exam. You may also have other tests, including:  Blood tests.  Urine tests.  Ultrasound of the abdomen. How is this treated? Treatment for this condition can vary. Treatment may include:  Watchful waiting. You will be monitored closely for any changes in bleeding. During this stage: ? The hematoma may be reabsorbed by the body. ? The hematoma may separate the fluid-filled space containing the embryo (gestational sac) from the wall of the  womb (endometrium).  Medicines.  Activity restriction. This may be needed until the bleeding stops. Follow these instructions at home:  Stay on bed rest if told to do so by your health care provider.  Do not lift anything that is heavier than 10 lbs. (4.5 kg) or as told by your health care provider.  Do not use any products that contain nicotine or tobacco, such as cigarettes and e-cigarettes. If you need help quitting, ask your health care provider.  Track and write down the number of pads you use each day and how soaked (saturated) they are.  Do not use tampons.  Keep all follow-up visits as told by your health care provider. This is important. Your health care provider may ask you to have follow-up blood tests or ultrasound tests or both. Contact a health care provider if:  You have any vaginal bleeding.  You have a fever. Get help right away if:  You have severe cramps in your stomach, back, abdomen, or pelvis.  You pass large clots or tissue. Save any tissue for your health care provider to look at.  You have more vaginal bleeding, and you faint or become lightheaded or weak. Summary  Vaginal Bleeding During Pregnancy, First Trimester  A small amount of bleeding from the vagina (spotting) is relatively common during early pregnancy. It usually stops on its own. Various things may cause bleeding or spotting during early pregnancy. Some bleeding may be related to the pregnancy, and some may not. In many cases, the bleeding is normal and is not a problem. However, bleeding can also be a sign of something serious. Be sure to tell your  health care provider about any vaginal bleeding right away. Some possible causes of vaginal bleeding during the first trimester include:  Infection or inflammation of the cervix.  Growths (polyps) on the cervix.  Miscarriage or threatened miscarriage.  Pregnancy tissue developing outside of the uterus (ectopic pregnancy).  A mass of tissue  developing in the uterus due to an egg being fertilized incorrectly (molar pregnancy). Follow these instructions at home: Activity  Follow instructions from your health care provider about limiting your activity. Ask what activities are safe for you.  If needed, make plans for someone to help with your regular activities.  Do not have sex or orgasms until your health care provider says that this is safe. General instructions  Take over-the-counter and prescription medicines only as told by your health care provider.  Pay attention to any changes in your symptoms.  Do not use tampons or douche.  Write down how many pads you use each day, how often you change pads, and how soaked (saturated) they are.  If you pass any tissue from your vagina, save the tissue so you can show it to your health care provider.  Keep all follow-up visits as told by your health care provider. This is important. Contact a health care provider if:  You have vaginal bleeding during any part of your pregnancy.  You have cramps or labor pains.  You have a fever. Get help right away if:  You have severe cramps in your back or abdomen.  You pass large clots or a large amount of tissue from your vagina.  Your bleeding increases.  You feel light-headed or weak, or you faint.  You have chills.  You are leaking fluid or have a gush of fluid from your vagina. Summary  A small amount of bleeding (spotting) from the vagina is relatively common during early pregnancy.  Various things may cause bleeding or spotting in early pregnancy.  Be sure to tell your health care provider about any vaginal bleeding right away. This information is not intended to replace advice given to you by your health care provider. Make sure you discuss any questions you have with your health care provider. Document Released: 02/14/2005 Document Revised: 08/09/2016 Document Reviewed: 08/09/2016 Elsevier Interactive Patient  Education  2019 [employer].   A subchorionic hematoma is a gathering of blood between the outer wall of the placenta and the uterus.  This condition can cause vaginal bleeding.  Sometimes you may have no symptoms and the bleeding may only be seen when ultrasound images are taken.  Treatment may include watchful waiting, medicines, or activity restriction. This information is not intended to replace advice given to you by your health care provider. Make sure you discuss any questions you have with your health care provider. Document Released: 08/22/2006 Document Revised: 07/03/2016 Document Reviewed: 07/03/2016 Elsevier Interactive Patient Education  2019 [employer].

## 2018-07-08 NOTE — MAU Provider Note (Signed)
History     CSN: [id number]  Arrival date and time: 07/08/18 1750   First Provider Initiated Contact with Patient 07/08/18 2025      Chief Complaint  Patient presents with  . Abdominal Pain  . Vaginal Bleeding   Jackie Chavez is a 23 y.o. G2P0102 at [id number] who presents for Abdominal Pain and Vaginal Bleeding.  She states she had some light vaginal spotting today with wiping, but had lots of bleeding two days ago.  She states she came to the MAU at that time, but could not be evaluated because she had her twin daughters with her.  Patient reports sexual activity within the last 72 hours.  She also reports treatment, last week, for known trichomoniasis and suspected PID while at the ER.  Patient denies issues with urination or bowel movements.  She also reports some intermittent cramping that lasts a few minutes and resolves without intervention.    OB History    Gravida  2   Para  1   Term      Preterm  1   AB      Living  2     SAB      TAB      Ectopic      Multiple  1   Live Births  2           Past Medical History:  Diagnosis Date  . Anemia   . Anxiety   . BV (bacterial vaginosis)   . Headache    migraines  . Hx of chlamydia infection 10/17/2016  . Pregnancy induced hypertension     Past Surgical History:  Procedure Laterality Date  . CESAREAN SECTION MULTI-GESTATIONAL N/A 05/05/2017   Procedure: CESAREAN SECTION MULTI-GESTATIONAL;  Surgeon: Catalina Antiguaonstant, Peggy, MD;  Location: [facility];  Service: Obstetrics;  Laterality: N/A;  . WOUND DEBRIDEMENT N/A 05/10/2017   Procedure: DEBRIDEMENT ABDOMINAL WOUND;  Surgeon: Lazaro ArmsEure, Luther H, MD;  Location: [facility];  Service: Gynecology;  Laterality: N/A;  physcian is requesting a wound vac    Family History  Problem Relation Age of Onset  . COPD Mother   . Diabetes Mother   . Mental illness Mother   . Alcohol abuse Mother   . Drug abuse Mother   . Diabetes Father   . Hypertension Father   .  Alcohol abuse Father   . Drug abuse Father     Social History   Tobacco Use  . Smoking status: Former Games developermoker  . Smokeless tobacco: Never Used  Substance Use Topics  . Alcohol use: No  . Drug use: No    Allergies:  Allergies  Allergen Reactions  . Coconut Flavor   . Latex Rash    Medications Prior to Admission  Medication Sig Dispense Refill Last Dose  . amLODipine (NORVASC) 10 MG tablet Take 1 tablet (10 mg total) by mouth daily. (Patient not taking: Reported on 06/24/2017) 30 tablet 1 Not Taking at Unknown time  . enalapril (VASOTEC) 10 MG tablet Take 1 tablet (10 mg total) by mouth daily. (Patient not taking: Reported on 06/24/2017) 30 tablet 1 Not Taking at Unknown time  . Ferrous Fumarate (HEMOCYTE - 106 MG FE) 324 (106 Fe) MG TABS tablet Take 1 tablet (106 mg of iron total) by mouth 2 (two) times daily. (Patient not taking: Reported on 06/24/2017) 30 tablet 2 Not Taking at Unknown time  . fluconazole (DIFLUCAN) 200 MG tablet Take one tablet by mouth, wait 72 hours, then  take second tablet by mouth 2 tablet 0   . ibuprofen (ADVIL,MOTRIN) 600 MG tablet Take 1 tablet (600 mg total) by mouth every 6 (six) hours as needed for mild pain. (Patient not taking: Reported on 06/24/2017) 30 tablet 0 Not Taking at Unknown time  . metroNIDAZOLE (FLAGYL) 500 MG tablet Take 1 tablet (500 mg total) by mouth 2 (two) times daily. 14 tablet 0   . oxyCODONE-acetaminophen (PERCOCET/ROXICET) 5-325 MG tablet Take 1 tablet by mouth every 4 (four) hours as needed for moderate pain. (Patient not taking: Reported on 06/24/2017) 20 tablet 0 Not Taking at Unknown time  . oxyCODONE-acetaminophen (ROXICET) 5-325 MG tablet Take 1-2 tablets by mouth every 4 (four) hours as needed for severe pain. (Patient not taking: Reported on 06/24/2017) 30 tablet 0 Not Taking at Unknown time  . Prenatal Vit-Fe Fumarate-FA (PRENATAL COMPLETE) 14-0.4 MG TABS Take 1 tablet by mouth daily. 60 each 0   . senna-docusate (SENOKOT-S) 8.6-50 MG  tablet Take 2 tablets by mouth daily. (Patient not taking: Reported on 06/24/2017) 60 tablet 1 Not Taking at Unknown time  . sulfamethoxazole-trimethoprim (BACTRIM DS,SEPTRA DS) 800-160 MG tablet Take 1 tablet by mouth 2 (two) times daily. (Patient not taking: Reported on 06/24/2017) 20 tablet 0 Not Taking at Unknown time    Review of Systems  Constitutional: Negative for chills and fever.  Gastrointestinal: Negative for abdominal pain, diarrhea, nausea and vomiting.  Genitourinary: Positive for vaginal bleeding. Negative for dysuria and vaginal discharge.  Neurological: Negative for dizziness, light-headedness and headaches.   Physical Exam   Blood pressure (!) 141/64, pulse 97, temperature 98.7 F (37.1 C), temperature source Oral, resp. rate 18, height 5\' 2"  (1.575 m), weight 91.6 kg, last menstrual period 11/21/2017, SpO2 100 %, unknown if currently breastfeeding.  Physical Exam  Constitutional: She is oriented to person, place, and time. She appears well-developed and well-nourished.  HENT:  Head: Normocephalic and atraumatic.  Eyes: Conjunctivae are normal.  Neck: Normal range of motion.  Cardiovascular: Normal rate, regular rhythm and normal heart sounds.  Respiratory: Effort normal and breath sounds normal.  GI: Soft. Bowel sounds are normal.  Genitourinary: Cervix exhibits no motion tenderness and no discharge.    No vaginal discharge or bleeding.  No bleeding in the vagina.    Genitourinary Comments: Speculum Exam: -Vaginal Vault: Pink mucosa.  Small amt thin white discharge -wet prep collected -Cervix:Pink, no lesions, cysts, or polyps.  Appears closed. No active bleeding from os. Bimanual Exam: Deferred   Musculoskeletal: Normal range of motion.  Neurological: She is alert and oriented to person, place, and time.  Skin: Skin is warm and dry.  Psychiatric: She has a normal mood and affect. Her behavior is normal.    MAU Course  Procedures Results for orders placed or  performed during the hospital encounter of 07/08/18 (from the past 24 hour(s))  Urinalysis, Routine w reflex microscopic     Status: Abnormal   Collection Time: 07/08/18  6:21 PM  Result Value Ref Range   Color, Urine YELLOW YELLOW   APPearance HAZY (A) CLEAR   Specific Gravity, Urine 1.025 1.005 - 1.030   pH 6.5 5.0 - 8.0   Glucose, UA NEGATIVE NEGATIVE mg/dL   Hgb urine dipstick NEGATIVE NEGATIVE   Bilirubin Urine NEGATIVE NEGATIVE   Ketones, ur NEGATIVE NEGATIVE mg/dL   Protein, ur NEGATIVE NEGATIVE mg/dL   Nitrite NEGATIVE NEGATIVE   Leukocytes,Ua NEGATIVE NEGATIVE   Intrauterine gestational sac: Present  Yolk sac:  Present  Embryo:  Present  Cardiac Activity: Present  Heart Rate: 129 bpm  MSD:   mm    w     d  CRL: 9.2 mm 6 w 6 d [place] EDC: 02/25/2019. Appropriate growth from prior ultrasound of 06/30/2018  Subchorionic hemorrhage: Small subchorionic hemorrhage measuring 3 x 9 mm. This appears slightly larger compared to the prior study  Maternal uterus/adnexae: Normal ovaries. Right corpus luteum. No free fluid.  IMPRESSION: Single living intrauterine pregnancy with appropriate growth from the prior study. Estimated gestational age [redacted] weeks 6 days.  Small subchorionic hemorrhage which appears slightly larger compared with the prior ultrasound of 06/30/2018  MDM Pelvic exam [place] while waiting UA Assessment and Plan  23 year old female G2P0102 at [facility]  -[place] results reviewed. -Discussed [facility] -Informed of need for TOC with primary ob once care is established. -[facility] -Bleeding precautions given -Encouraged to call or return to MAU if symptoms worsen or with the onset of new symptoms. -Discharged to home in stable condition   Cherre Robins MSN, CNM 07/08/2018, 8:25 PM

## 2018-10-23 ENCOUNTER — Inpatient Hospital Stay (HOSPITAL_COMMUNITY)
Admission: EM | Admit: 2018-10-23 | Discharge: 2018-10-23 | Disposition: A | Discharge status: Home / Self Care | Payer: Medicaid Other | Attending: Obstetrics & Gynecology | Admitting: Obstetrics & Gynecology

## 2018-10-23 ENCOUNTER — Other Ambulatory Visit: Payer: Self-pay

## 2018-10-23 ENCOUNTER — Encounter (HOSPITAL_COMMUNITY): Payer: Self-pay

## 2018-10-23 DIAGNOSIS — R109 Unspecified abdominal pain: Secondary | ICD-10-CM | POA: Diagnosis present

## 2018-10-23 DIAGNOSIS — O98812 Other maternal infectious and parasitic diseases complicating pregnancy, second trimester: Secondary | ICD-10-CM | POA: Insufficient documentation

## 2018-10-23 DIAGNOSIS — O10912 Unspecified pre-existing hypertension complicating pregnancy, second trimester: Secondary | ICD-10-CM

## 2018-10-23 DIAGNOSIS — O34219 Maternal care for unspecified type scar from previous cesarean delivery: Secondary | ICD-10-CM | POA: Diagnosis not present

## 2018-10-23 DIAGNOSIS — I10 Essential (primary) hypertension: Secondary | ICD-10-CM | POA: Diagnosis present

## 2018-10-23 DIAGNOSIS — Z87891 Personal history of nicotine dependence: Secondary | ICD-10-CM | POA: Insufficient documentation

## 2018-10-23 DIAGNOSIS — O10012 Pre-existing essential hypertension complicating pregnancy, second trimester: Secondary | ICD-10-CM | POA: Diagnosis not present

## 2018-10-23 DIAGNOSIS — Z20828 Contact with and (suspected) exposure to other viral communicable diseases: Secondary | ICD-10-CM | POA: Diagnosis not present

## 2018-10-23 DIAGNOSIS — B373 Candidiasis of vulva and vagina: Secondary | ICD-10-CM | POA: Insufficient documentation

## 2018-10-23 DIAGNOSIS — Z3A22 22 weeks gestation of pregnancy: Secondary | ICD-10-CM | POA: Insufficient documentation

## 2018-10-23 DIAGNOSIS — B3731 Acute candidiasis of vulva and vagina: Secondary | ICD-10-CM

## 2018-10-23 HISTORY — DX: Essential (primary) hypertension: I10

## 2018-10-23 LAB — COMPREHENSIVE METABOLIC PANEL
ALT: 12 U/L (ref 0–44)
AST: 10 U/L — ABNORMAL LOW (ref 15–41)
Albumin: 2.8 g/dL — ABNORMAL LOW (ref 3.5–5.0)
Alkaline Phosphatase: 56 U/L (ref 38–126)
Anion gap: 8 (ref 5–15)
BUN: 5 mg/dL — ABNORMAL LOW (ref 6–20)
CO2: 22 mmol/L (ref 22–32)
Calcium: 8.6 mg/dL — ABNORMAL LOW (ref 8.9–10.3)
Chloride: 107 mmol/L (ref 98–111)
Creatinine, Ser: 0.48 mg/dL (ref 0.44–1.00)
GFR calc Af Amer: >60 mL/min (ref >60)
GFR calc non Af Amer: >60 mL/min (ref >60)
Glucose, Bld: 100 mg/dL — ABNORMAL HIGH (ref 70–99)
Potassium: 3.8 mmol/L (ref 3.5–5.1)
Sodium: 137 mmol/L (ref 135–145)
Total Bilirubin: 0.3 mg/dL (ref 0.3–1.2)
Total Protein: 5.5 g/dL — ABNORMAL LOW (ref 6.5–8.1)

## 2018-10-23 LAB — WET PREP, GENITAL
Clue Cells Wet Prep HPF POC: NONE SEEN
Sperm: NONE SEEN
Trich, Wet Prep: NONE SEEN

## 2018-10-23 LAB — POCT FERN TEST: POCT Fern Test: NEGATIVE

## 2018-10-23 LAB — URINALYSIS, ROUTINE W REFLEX MICROSCOPIC
Bacteria, UA: NONE SEEN
Bilirubin Urine: NEGATIVE
Glucose, UA: NEGATIVE mg/dL
Hgb urine dipstick: NEGATIVE
Ketones, ur: NEGATIVE mg/dL
Nitrite: NEGATIVE
Protein, ur: NEGATIVE mg/dL
Specific Gravity, Urine: 1.023 (ref 1.005–1.030)
pH: 6 (ref 5.0–8.0)

## 2018-10-23 LAB — CBC
HCT: 32.9 % — ABNORMAL LOW (ref 36.0–46.0)
Hemoglobin: 11.2 g/dL — ABNORMAL LOW (ref 12.0–15.0)
MCH: 30.6 pg (ref 26.0–34.0)
MCHC: 34 g/dL (ref 30.0–36.0)
MCV: 89.9 fL (ref 80.0–100.0)
Platelets: 188 10*3/uL (ref 150–400)
RBC: 3.66 MIL/uL — ABNORMAL LOW (ref 3.87–5.11)
RDW: 13.3 % (ref 11.5–15.5)
WBC: 10.3 10*3/uL (ref 4.0–10.5)
nRBC: 0 % (ref 0.0–0.2)

## 2018-10-23 LAB — PROTEIN / CREATININE RATIO, URINE
Creatinine, Urine: 124.08 mg/dL
Protein Creatinine Ratio: 0.1 mg/mg{Cre} (ref 0.00–0.15)
Total Protein, Urine: 12 mg/dL

## 2018-10-23 LAB — OB RESULTS CONSOLE GC/CHLAMYDIA: Gonorrhea: NEGATIVE

## 2018-10-23 MED ORDER — TERCONAZOLE 0.8 % VA CREA: 1.0000 | TOPICAL_CREAM | Freq: Every day | VAGINAL | 0 refills | Status: DC

## 2018-10-23 MED ORDER — LABETALOL HCL 200 MG PO TABS: 200.0000 mg | ORAL_TABLET | Freq: Two times a day (BID) | ORAL | 0 refills | Status: DC

## 2018-10-23 NOTE — MAU Note (Signed)
Pt states she was laying in bed and felt like her panties were wet. Went to bathroom and noticed some clear, mucousy discharge around 11pm. Reports she is now having abdominal cramping. Pt denies vaginal bleeding. Pt has not had any PNC.

## 2018-10-23 NOTE — Discharge Instructions (Signed)
Hypertension During Pregnancy ° °Hypertension, commonly called high blood pressure, is when the force of blood pumping through your arteries is too strong. Arteries are blood vessels that carry blood from the heart throughout the body. Hypertension during pregnancy can cause problems for you and your baby. Your baby may be born early (prematurely) or may not weigh as much as he or she should at birth. Very bad cases of hypertension during pregnancy can be life-threatening. °Different types of hypertension can occur during pregnancy. These include: °· Chronic hypertension. This happens when: °? You have hypertension before pregnancy and it continues during pregnancy. °? You develop hypertension before you are [redacted] weeks pregnant, and it continues during pregnancy. °· Gestational hypertension. This is hypertension that develops after the 20th week of pregnancy. °· Preeclampsia, also called toxemia of pregnancy. This is a very serious type of hypertension that develops during pregnancy. It can be very dangerous for you and your baby. °? In rare cases, you may develop preeclampsia after giving birth (postpartum preeclampsia). This usually occurs within 48 hours after childbirth but may occur up to 6 weeks after giving birth. °Gestational hypertension and preeclampsia usually go away within 6 weeks after your baby is born. Women who have hypertension during pregnancy have a greater chance of developing hypertension later in life or during future pregnancies. °What are the causes? °The exact cause of hypertension during pregnancy is not known. °What increases the risk? °There are certain factors that make it more likely for you to develop hypertension during pregnancy. These include: °· Having hypertension during a previous pregnancy or prior to pregnancy. °· Being overweight. °· Being age 35 or older. °· Being pregnant for the first time. °· Being pregnant with more than one baby. °· Becoming pregnant using fertilization  methods such as IVF (in vitro fertilization). °· Having diabetes, kidney problems, or systemic lupus erythematosus. °· Having a family history of hypertension. °What are the signs or symptoms? °Chronic hypertension and gestational hypertension rarely cause symptoms. Preeclampsia causes symptoms, which may include: °· Increased protein in your urine. Your health care provider will check for this at every visit before you give birth (prenatal visit). °· Severe headaches. °· Sudden weight gain. °· Swelling of the hands, face, legs, and feet. °· Nausea and vomiting. °· Vision problems, such as blurred or double vision. °· Numbness in the face, arms, legs, and feet. °· Dizziness. °· Slurred speech. °· Sensitivity to bright lights. °· Abdominal pain. °· Convulsions or seizures. °How is this diagnosed? °You may be diagnosed with hypertension during a routine prenatal exam. At each prenatal visit, you may: °· Have a urine test to check for high amounts of protein in your urine. °· Have your blood pressure checked. A blood pressure reading is given as two numbers, such as "120 over 80" (or 120/80). The first ("top") number is a measure of the pressure in your arteries when your heart beats (systolic pressure). The second ("bottom") number is a measure of the pressure in your arteries as your heart relaxes between beats (diastolic pressure). Blood pressure is measured in a unit called mm Hg. For most women, a normal blood pressure reading is: °? Systolic: below 120. °? Diastolic: below 80. °The type of hypertension that you are diagnosed with depends on your test results and when your symptoms developed. °· Chronic hypertension is usually diagnosed before 20 weeks of pregnancy. °· Gestational hypertension is usually diagnosed after 20 weeks of pregnancy. °· Hypertension with high amounts of protein in   the urine is diagnosed as preeclampsia.  Blood pressure measurements that stay above 160 systolic, or above 110 diastolic,  are signs of severe preeclampsia. How is this treated? Treatment for hypertension during pregnancy varies depending on the type of hypertension you have and how serious it is.  If you take medicines called ACE inhibitors to treat chronic hypertension, you may need to switch medicines. ACE inhibitors should not be taken during pregnancy.  If you have gestational hypertension, you may need to take blood pressure medicine.  If you are at risk for preeclampsia, your health care provider may recommend that you take a low-dose aspirin during your pregnancy.  If you have severe preeclampsia, you may need to be hospitalized so you and your baby can be monitored closely. You may also need to take medicine (magnesium sulfate) to prevent seizures and to lower blood pressure. This medicine may be given as an injection or through an IV.  In some cases, if your condition gets worse, you may need to deliver your baby early. Follow these instructions at home: Eating and drinking   Drink enough fluid to keep your urine pale yellow.  Avoid caffeine. Lifestyle  Do not use any products that contain nicotine or tobacco, such as cigarettes and e-cigarettes. If you need help quitting, ask your health care provider.  Do not use alcohol or drugs.  Avoid stress as much as possible. Rest and get plenty of sleep. General instructions  Take over-the-counter and prescription medicines only as told by your health care provider.  While lying down, lie on your left side. This keeps pressure off your major blood vessels.  While sitting or lying down, raise (elevate) your feet. Try putting some pillows under your lower legs.  Exercise regularly. Ask your health care provider what kinds of exercise are best for you.  Keep all prenatal and follow-up visits as told by your health care provider. This is important. Contact a health care provider if:  You have symptoms that your health care provider told you may  require more treatment or monitoring, such as: ? Nausea or vomiting. ? Headache. Get help right away if you have:  Severe abdominal pain that does not get better with treatment.  A severe headache that does not get better.  Vomiting that does not get better.  Sudden, rapid weight gain.  Sudden swelling in your hands, ankles, or face.  Vaginal bleeding.  Blood in your urine.  Fewer movements from your baby than usual.  Blurred or double vision.  Muscle twitching or sudden muscle tightening (spasms).  Shortness of breath.  Blue fingernails or lips. Summary  Hypertension, commonly called high blood pressure, is when the force of blood pumping through your arteries is too strong.  Hypertension during pregnancy can cause problems for you and your baby.  Treatment for hypertension during pregnancy varies depending on the type of hypertension you have and how serious it is.  Get help right away if you have symptoms that your health care provider told you to watch for. This information is not intended to replace advice given to you by your health care provider. Make sure you discuss any questions you have with your health care provider. Document Released: 01/23/2011 Document Revised: 04/23/2017 Document Reviewed: 10/21/2015 Elsevier Interactive Patient Education  2019 [employer]  Vaginal Yeast infection, Adult  Vaginal yeast infection is a condition that causes vaginal discharge as well as soreness, swelling, and redness (inflammation) of the vagina. This is a common condition. Some  women get this infection frequently. What are the causes? This condition is caused by a change in the normal balance of the yeast (candida) and bacteria that live in the vagina. This change causes an overgrowth of yeast, which causes the inflammation. What increases the risk? The condition is more likely to develop in women who:  Take antibiotic medicines.  Have diabetes.  Take birth  control pills.  Are pregnant.  Douche often.  Have a weak body defense system (immune system).  Have been taking steroid medicines for a long time.  Frequently wear tight clothing. What are the signs or symptoms? Symptoms of this condition include:  White, thick, creamy vaginal discharge.  Swelling, itching, redness, and irritation of the vagina. The lips of the vagina (vulva) may be affected as well.  Pain or a burning feeling while urinating.  Pain during sex. How is this diagnosed? This condition is diagnosed based on:  Your medical history.  A physical exam.  A pelvic exam. Your health care provider will examine a sample of your vaginal discharge under a microscope. Your health care provider may send this sample for testing to confirm the diagnosis. How is this treated? This condition is treated with medicine. Medicines may be over-the-counter or prescription. You may be told to use one or more of the following:  Medicine that is taken by mouth (orally).  Medicine that is applied as a cream (topically).  Medicine that is inserted directly into the vagina (suppository). Follow these instructions at home:  Lifestyle  Do not have sex until your health care provider approves. Tell your sex partner that you have a yeast infection. That person should go to his or her health care provider and ask if they should also be treated.  Do not wear tight clothes, such as pantyhose or tight pants.  Wear breathable cotton underwear. General instructions  Take or apply over-the-counter and prescription medicines only as told by your health care provider.  Eat more yogurt. This may help to keep your yeast infection from returning.  Do not use tampons until your health care provider approves.  Try taking a sitz bath to help with discomfort. This is a warm water bath that is taken while you are sitting down. The water should only come up to your hips and should cover your  buttocks. Do this 3-4 times per day or as told by your health care provider.  Do not douche.  If you have diabetes, keep your blood sugar levels under control.  Keep all follow-up visits as told by your health care provider. This is important. Contact a health care provider if:  You have a fever.  Your symptoms go away and then return.  Your symptoms do not get better with treatment.  Your symptoms get worse.  You have new symptoms.  You develop blisters in or around your vagina.  You have blood coming from your vagina and it is not your menstrual period.  You develop pain in your abdomen. Summary  Vaginal yeast infection is a condition that causes discharge as well as soreness, swelling, and redness (inflammation) of the vagina.  This condition is treated with medicine. Medicines may be over-the-counter or prescription.  Take or apply over-the-counter and prescription medicines only as told by your health care provider.  Do not douche. Do not have sex or use tampons until your health care provider approves.  Contact a health care provider if your symptoms do not get better with treatment or your symptoms  go away and then return. This information is not intended to replace advice given to you by your health care provider. Make sure you discuss any questions you have with your health care provider. Document Released: 02/14/2005 Document Revised: 09/23/2017 Document Reviewed: 09/23/2017 Elsevier Interactive Patient Education  2019 [employer].

## 2018-10-23 NOTE — MAU Provider Note (Signed)
Chief Complaint: Vaginal Discharge and Abdominal Pain   First Provider Initiated Contact with Patient 10/23/18 0204     SUBJECTIVE HPI: Jackie Chavez is a 23 y.o. G2P0102 at [redacted]w[redacted]d who presents to [facility]missions reporting vaginal discharge & abdominal cramping. Has not had prenatal care with this pregnancy. Has hx of preterm twin delivery & preeclampsia.  Current symptoms started earlier tonight. States her underwear was wet. Clear & mucoid with ammonia like smell. Treated self for yeast infection a few weeks ago. Denies vaginal bleeding. Last intercourse was a few days ago.  Also reports lower abdominal cramping. Denies dysuria, n/v/d, constipation, or fever/chills.  To her knowledge, she does not have CHTN. Denies headache, visual disturbance or epigastric pain.   Location: abdomen Quality: cramping Severity: 6/10 on pain scale Duration: 1 days Timing: intermittent Modifying factors: none Associated signs and symptoms: vaginal discharge  Past Medical History:  Diagnosis Date  . Anemia   . Anxiety   . BV (bacterial vaginosis)   . Chronic hypertension   . Headache    migraines  . Hx of chlamydia infection 10/17/2016  . Pregnancy induced hypertension    preeclampsia   OB History  Gravida Para Term Preterm AB Living  2 1   1   2   SAB TAB Ectopic Multiple Live Births        1 2    # Outcome Date GA Lbr Len/2nd Weight Sex Delivery Anes PTL Lv  2 Current           1A Preterm 05/05/17 108w3d  2380 g F CS-LTranv Gen  LIV     Birth Comments: preterm     Complications: Preeclampsia  1B Preterm 05/05/17 [redacted]w[redacted]d  2030 g F CS-LTranv Gen  LIV     Birth Comments: perterm     Complications: Preeclampsia   Past Surgical History:  Procedure Laterality Date  . CESAREAN SECTION MULTI-GESTATIONAL N/A 05/05/2017   Procedure: CESAREAN SECTION MULTI-GESTATIONAL;  Surgeon: Catalina Antigua, MD;  Location: [facility];  Service: Obstetrics;  Laterality: N/A;  . WOUND DEBRIDEMENT  N/A 05/10/2017   Procedure: DEBRIDEMENT ABDOMINAL WOUND;  Surgeon: [name], MD;  Location: [facility];  Service: Gynecology;  Laterality: N/A;  physcian is requesting a wound vac   Social History   Socioeconomic History  . Marital status: Single    Spouse name: Not on file  . Number of children: Not on file  . Years of education: Not on file  . Highest education level: Not on file  Occupational History  . Not on file  Social Needs  . Financial resource strain: Not on file  . Food insecurity:    Worry: Not on file    Inability: Not on file  . Transportation needs:    Medical: Not on file    Non-medical: Not on file  Tobacco Use  . Smoking status: Former Games developer  . Smokeless tobacco: Never Used  Substance and Sexual Activity  . Alcohol use: No  . Drug use: No  . Sexual activity: Yes    Birth control/protection: None  Lifestyle  . Physical activity:    Days per week: Not on file    Minutes per session: Not on file  . Stress: Not on file  Relationships  . Social connections:    Talks on phone: Not on file    Gets together: Not on file    Attends religious service: Not on file    Active member of club or organization:  Not on file    Attends meetings of clubs or organizations: Not on file    Relationship status: Not on file  . Intimate partner violence:    Fear of current or ex partner: Not on file    Emotionally abused: Not on file    Physically abused: Not on file    Forced sexual activity: Not on file  Other Topics Concern  . Not on file  Social History Narrative  . Not on file   Family History  Problem Relation Age of Onset  . COPD Mother   . Diabetes Mother   . Mental illness Mother   . Alcohol abuse Mother   . Drug abuse Mother   . Diabetes Father   . Hypertension Father   . Alcohol abuse Father   . Drug abuse Father    No current facility-administered medications on file prior to encounter.    Current Outpatient Medications on File Prior to  Encounter  Medication Sig Dispense Refill  . Prenatal Vit-Fe Fumarate-FA (PRENATAL COMPLETE) 14-0.4 MG TABS Take 1 tablet by mouth daily. 60 each 0   Allergies  Allergen Reactions  . Coconut Flavor Anaphylaxis  . Latex Rash    I have reviewed patient's Past Medical Hx, Surgical Hx, Family Hx, Social Hx, medications and allergies.   Review of Systems  Constitutional: Negative.   Gastrointestinal: Positive for abdominal pain.  Genitourinary: Positive for vaginal discharge. Negative for dysuria and vaginal bleeding.  Neurological: Negative.     OBJECTIVE Patient Vitals for the past 24 hrs:  BP Temp Temp src Pulse Resp SpO2 Height Weight  10/23/18 0156 (!) 143/64 - - 90 - - - -  10/23/18 0135 (!) 144/85 98.1 F (36.7 C) Oral 87 16 100 % - -  10/23/18 0134 - - - - - -  (1.549 m) 97.3 kg   Constitutional: Well-developed, well-nourished female in no acute distress.  Cardiovascular: normal rate & rhythm, no murmur Respiratory: normal rate and effort. Lung sounds clear throughout GI: Abd soft, non-tender, Pos BS x 4. No guarding or rebound tenderness MS: Extremities nontender, no edema, normal ROM Neurologic: Alert and oriented x 4.  GU:     SPECULUM EXAM: NEFG, moderate amount of white clumpy discharge. No pooling of fluid & no blood.  Dilation: Closed Effacement (%): Thick Cervical Position: Posterior Exam by:: Judeth Horn NP       LAB RESULTS Results for orders placed or performed during the hospital encounter of 10/23/18 (from the past 24 hour(s))  Urinalysis, Routine w reflex microscopic     Status: Abnormal   Collection Time: 10/23/18  2:03 AM  Result Value Ref Range   Color, Urine YELLOW YELLOW   APPearance HAZY (A) CLEAR   Specific Gravity, Urine 1.023 1.005 - 1.030   pH 6.0 5.0 - 8.0   Glucose, UA NEGATIVE NEGATIVE mg/dL   Hgb urine dipstick NEGATIVE NEGATIVE   Bilirubin Urine NEGATIVE NEGATIVE   Ketones, ur NEGATIVE NEGATIVE mg/dL   Protein, ur NEGATIVE  NEGATIVE mg/dL   Nitrite NEGATIVE NEGATIVE   Leukocytes,Ua TRACE (A) NEGATIVE   RBC / HPF 0-5 0 - 5 RBC/hpf   WBC, UA 0-5 0 - 5 WBC/hpf   Bacteria, UA NONE SEEN NONE SEEN   Squamous Epithelial / LPF 0-5 0 - 5   Mucus PRESENT   Wet prep, genital     Status: Abnormal   Collection Time: 10/23/18  2:16 AM  Result Value Ref Range   Yeast  Wet Prep HPF POC PRESENT (A) NONE SEEN   Trich, Wet Prep NONE SEEN NONE SEEN   Clue Cells Wet Prep HPF POC NONE SEEN NONE SEEN   WBC, Wet Prep HPF POC MANY (A) NONE SEEN   Sperm NONE SEEN   POCT fern test     Status: None   Collection Time: 10/23/18  2:18 AM  Result Value Ref Range   POCT Fern Test Negative = intact amniotic membranes   CBC     Status: Abnormal   Collection Time: 10/23/18  2:24 AM  Result Value Ref Range   WBC 10.3 4.0 - 10.5 K/uL   RBC 3.66 (L) 3.87 - 5.11 MIL/uL   Hemoglobin 11.2 (L) 12.0 - 15.0 g/dL   HCT 91.4 (L) 78.2 - 95.6 %   MCV 89.9 80.0 - 100.0 fL   MCH 30.6 26.0 - 34.0 pg   MCHC 34.0 30.0 - 36.0 g/dL   RDW 21.3 08.6 - 57.8 %   Platelets 188 150 - 400 K/uL   nRBC 0.0 0.0 - 0.2 %  Comprehensive metabolic panel     Status: Abnormal   Collection Time: 10/23/18  2:24 AM  Result Value Ref Range   Sodium 137 135 - 145 mmol/L   Potassium 3.8 3.5 - 5.1 mmol/L   Chloride 107 98 - 111 mmol/L   CO2 22 22 - 32 mmol/L   Glucose, Bld 100 (H) 70 - 99 mg/dL   BUN 5 (L) 6 - 20 mg/dL   Creatinine, Ser 4.69 0.44 - 1.00 mg/dL   Calcium 8.6 (L) 8.9 - 10.3 mg/dL   Total Protein 5.5 (L) 6.5 - 8.1 g/dL   Albumin 2.8 (L) 3.5 - 5.0 g/dL   AST 10 (L) 15 - 41 U/L   ALT 12 0 - 44 U/L   Alkaline Phosphatase 56 38 - 126 U/L   Total Bilirubin 0.3 0.3 - 1.2 mg/dL   GFR calc non Af Amer >60 >60 mL/min   GFR calc Af Amer >60 >60 mL/min   Anion gap 8 5 - 15    IMAGING No results found.  MAU COURSE Orders Placed This Encounter  Procedures  . Culture, OB Urine  . Wet prep, genital  . Urinalysis, Routine w reflex microscopic  . CBC  .  Comprehensive metabolic panel  . Protein / creatinine ratio, urine  . POCT fern test  . Discharge patient   Meds ordered this encounter  Medications  . labetalol (NORMODYNE) 200 MG tablet    Sig: Take 1 tablet (200 mg total) by mouth 2 (two) times daily for 30 days.    Dispense:  60 tablet    Refill:  0    Order Specific Question:   Supervising Provider    Answer:   Despina Hidden, LUTHER H [[id number]]  . terconazole (TERAZOL 3) 0.8 % vaginal cream    Sig: Place 1 applicator vaginally at bedtime.    Dispense:  20 g    Refill:  0    Order Specific Question:   Supervising Provider    Answer:   [name] [[id number]]    MDM SSE performed. No pooling of fluid & fern negative. Exam consistent with yeast infection. GC/CT & wet prep collected.   Cervix closed/thick FHT present via doppler  Elevated BPs. None severe range. Per review of epic, patient has consistently had elevated BPs for the last year. CHTN added to problem list & med hx. Baseline labs collected tonight. Msg sent to  [facility] for pt to start care asap. Will d/c home on labetalol.   ASSESSMENT 1. Vaginal yeast infection   2. Chronic hypertension complicating or reason for care during pregnancy, second trimester   3. [redacted] weeks gestation of pregnancy     PLAN Discharge home in stable condition. PEC & PTL precautions Msg sent to [facility] to start care asap GC/CT & urine culture pending  Allergies as of 10/23/2018      Reactions   Coconut Flavor Anaphylaxis   Latex Rash      Medication List    TAKE these medications   labetalol 200 MG tablet Commonly known as:  NORMODYNE Take 1 tablet (200 mg total) by mouth 2 (two) times daily for 30 days.   Prenatal Complete 14-0.4 MG Tabs Take 1 tablet by mouth daily.   terconazole 0.8 % vaginal cream Commonly known as:  Terazol 3 Place 1 applicator vaginally at bedtime.        Judeth HornLawrence, Lanis Storlie, NP 10/23/2018  3:24 AM

## 2018-10-24 LAB — CULTURE, OB URINE: Culture: <10000 — AB

## 2018-10-24 LAB — GC/CHLAMYDIA PROBE AMP (~~LOC~~) NOT AT ARMC
Chlamydia: NEGATIVE
Neisseria Gonorrhea: NEGATIVE

## 2018-10-29 ENCOUNTER — Other Ambulatory Visit: Payer: Self-pay

## 2018-10-29 ENCOUNTER — Other Ambulatory Visit (HOSPITAL_COMMUNITY)
Admission: RE | Admit: 2018-10-29 | Discharge: 2018-10-29 | Disposition: A | Discharge status: Home / Self Care | Payer: Medicaid Other | Source: Ambulatory Visit | Attending: Obstetrics and Gynecology | Admitting: Obstetrics and Gynecology

## 2018-10-29 ENCOUNTER — Ambulatory Visit (INDEPENDENT_AMBULATORY_CARE_PROVIDER_SITE_OTHER): Payer: Medicaid Other | Admitting: Obstetrics and Gynecology

## 2018-10-29 ENCOUNTER — Encounter: Payer: Self-pay | Admitting: Obstetrics and Gynecology

## 2018-10-29 VITALS — BP 134/86 | HR 102 | Wt 214.0 lb

## 2018-10-29 DIAGNOSIS — I10 Essential (primary) hypertension: Secondary | ICD-10-CM

## 2018-10-29 DIAGNOSIS — O0990 Supervision of high risk pregnancy, unspecified, unspecified trimester: Secondary | ICD-10-CM | POA: Insufficient documentation

## 2018-10-29 DIAGNOSIS — O0992 Supervision of high risk pregnancy, unspecified, second trimester: Secondary | ICD-10-CM

## 2018-10-29 DIAGNOSIS — Z98891 History of uterine scar from previous surgery: Secondary | ICD-10-CM | POA: Diagnosis not present

## 2018-10-29 DIAGNOSIS — Z3A23 23 weeks gestation of pregnancy: Secondary | ICD-10-CM

## 2018-10-29 MED ORDER — BLOOD PRESSURE MONITOR KIT: 1.0000 | PACK | 0 refills | Status: DC

## 2018-10-29 MED ORDER — ASPIRIN EC 81 MG PO TBEC: 81.0000 mg | DELAYED_RELEASE_TABLET | Freq: Every day | ORAL | 2 refills | Status: DC

## 2018-10-29 MED ORDER — PRENATE MINI 29-0.6-0.4-350 MG PO CAPS: 1.0000 | ORAL_CAPSULE | Freq: Every day | ORAL | 11 refills | Status: DC

## 2018-10-29 NOTE — Progress Notes (Signed)
Subjective:  Jackie Chavez is a 23 y.o. G2P0102 at [redacted]w[redacted]d being seen today for her first OB visit. EDD by first trimester U/S. H/O twin gestation in 6160, complicated by [facility], cord prolapse requiring c section. Wound infection POD # 4 surgery debridment and wound vac. Pt did not return for postpartum visit. Chronic hypertension now.    She is currently monitored for the following issues for this high-risk pregnancy and has Chronic hypertension; Supervision of high risk pregnancy, antepartum; and History of cesarean section on their problem list.  Patient reports no complaints.  Contractions: Not present. Vag. Bleeding: None.  Movement: Present. Denies leaking of fluid.   The following portions of the patient's history were reviewed and updated as appropriate: allergies, current medications, past family history, past medical history, past social history, past surgical history and problem list. Problem list updated.  Objective:   Vitals:   10/29/18 1527  BP: 134/86  Pulse: (!) 102  Weight: 214 lb (97.1 kg)    Fetal Status: Fetal Heart Rate (bpm): 140   Movement: Present     General:  Alert, oriented and cooperative. Patient is in no acute distress.  Skin: Skin is warm and dry. No rash noted.   Cardiovascular: Normal heart rate noted  Respiratory: Normal respiratory effort, no problems with respiration noted  Abdomen: Soft, gravid, appropriate for gestational age. Pain/Pressure: Absent     Pelvic:  Cervical exam performed        Extremities: Normal range of motion.     Mental Status: Normal mood and affect. Normal behavior. Normal judgment and thought content.   Urinalysis:      Assessment and Plan:  Pregnancy: G2P0102 at [redacted]w[redacted]d  1. Supervision of high risk pregnancy, antepartum Prenatal labs and care reviewed with pt Genetic testing discussed - Cytology - PAP( [place]) - Obstetric Panel, Including HIV - Genetic Screening - Enroll Patient in Babyscripts - Babyscripts  Schedule Optimization - [place] MFM OB DETAIL +14 WK; Future  2. Chronic hypertension CHTN and pregnancy reviewed with pt. Baby Rx and BP cuff ordered BP monitoring reviewed with pt Continue with Labetalol and start BASA  3. History of cesarean section Discuss TOLAC at later visit Pt interested in TOLAC  Preterm labor symptoms and general obstetric precautions including but not limited to vaginal bleeding, contractions, leaking of fluid and fetal movement were reviewed in detail with the patient. Please refer to After Visit Summary for other counseling recommendations.  Return in about 2 weeks (around 11/12/2018) for OB visit, face to face.   Chancy Milroy, MD

## 2018-10-29 NOTE — Patient Instructions (Signed)

## 2018-10-31 LAB — CYTOLOGY - PAP: Diagnosis: NEGATIVE

## 2018-10-31 LAB — OBSTETRIC PANEL, INCLUDING HIV
Antibody Screen: NEGATIVE
Basophils Absolute: 0 10*3/uL (ref 0.0–0.2)
Basos: 0 %
EOS (ABSOLUTE): 0.2 10*3/uL (ref 0.0–0.4)
Eos: 2 %
HIV Screen 4th Generation wRfx: NONREACTIVE
Hematocrit: 35.6 % (ref 34.0–46.6)
Hemoglobin: 12 g/dL (ref 11.1–15.9)
Hepatitis B Surface Ag: NEGATIVE
Immature Grans (Abs): 0.1 10*3/uL (ref 0.0–0.1)
Immature Granulocytes: 1 %
Lymphocytes Absolute: 2.2 10*3/uL (ref 0.7–3.1)
Lymphs: 21 %
MCH: 30.5 pg (ref 26.6–33.0)
MCHC: 33.7 g/dL (ref 31.5–35.7)
MCV: 91 fL (ref 79–97)
Monocytes Absolute: 0.6 10*3/uL (ref 0.1–0.9)
Monocytes: 6 %
Neutrophils Absolute: 7.5 10*3/uL — ABNORMAL HIGH (ref 1.4–7.0)
Neutrophils: 70 %
Platelets: 199 10*3/uL (ref 150–450)
RBC: 3.93 x10E6/uL (ref 3.77–5.28)
RDW: 12.7 % (ref 11.7–15.4)
RPR Ser Ql: NONREACTIVE
Rh Factor: POSITIVE
Rubella Antibodies, IGG: 2.22 index (ref >0.99)
WBC: 10.6 10*3/uL (ref 3.4–10.8)

## 2018-11-05 ENCOUNTER — Encounter: Payer: Self-pay | Admitting: Obstetrics and Gynecology

## 2018-11-06 ENCOUNTER — Encounter (HOSPITAL_COMMUNITY): Payer: Self-pay

## 2018-11-06 ENCOUNTER — Ambulatory Visit (HOSPITAL_COMMUNITY)
Admission: RE | Admit: 2018-11-06 | Discharge: 2018-11-06 | Disposition: A | Discharge status: Home / Self Care | Payer: Medicaid Other | Source: Ambulatory Visit | Attending: Obstetrics and Gynecology | Admitting: Obstetrics and Gynecology

## 2018-11-06 ENCOUNTER — Ambulatory Visit (HOSPITAL_COMMUNITY): Payer: Medicaid Other | Admitting: *Deleted

## 2018-11-06 ENCOUNTER — Other Ambulatory Visit: Payer: Self-pay

## 2018-11-06 DIAGNOSIS — O34219 Maternal care for unspecified type scar from previous cesarean delivery: Secondary | ICD-10-CM | POA: Diagnosis not present

## 2018-11-06 DIAGNOSIS — O0990 Supervision of high risk pregnancy, unspecified, unspecified trimester: Secondary | ICD-10-CM | POA: Insufficient documentation

## 2018-11-06 DIAGNOSIS — O09212 Supervision of pregnancy with history of pre-term labor, second trimester: Secondary | ICD-10-CM

## 2018-11-06 DIAGNOSIS — Z363 Encounter for antenatal screening for malformations: Secondary | ICD-10-CM | POA: Diagnosis not present

## 2018-11-06 DIAGNOSIS — O10012 Pre-existing essential hypertension complicating pregnancy, second trimester: Secondary | ICD-10-CM

## 2018-11-06 DIAGNOSIS — Z3A24 24 weeks gestation of pregnancy: Secondary | ICD-10-CM | POA: Diagnosis not present

## 2018-11-07 ENCOUNTER — Other Ambulatory Visit (HOSPITAL_COMMUNITY): Payer: Self-pay | Admitting: *Deleted

## 2018-11-07 DIAGNOSIS — Z3490 Encounter for supervision of normal pregnancy, unspecified, unspecified trimester: Secondary | ICD-10-CM | POA: Diagnosis not present

## 2018-11-07 DIAGNOSIS — O10913 Unspecified pre-existing hypertension complicating pregnancy, third trimester: Secondary | ICD-10-CM

## 2018-11-07 DIAGNOSIS — Z136 Encounter for screening for cardiovascular disorders: Secondary | ICD-10-CM | POA: Diagnosis not present

## 2018-11-10 ENCOUNTER — Encounter: Payer: Self-pay | Admitting: Obstetrics and Gynecology

## 2018-11-12 ENCOUNTER — Ambulatory Visit (INDEPENDENT_AMBULATORY_CARE_PROVIDER_SITE_OTHER): Payer: Medicaid Other | Admitting: Obstetrics

## 2018-11-12 ENCOUNTER — Encounter: Payer: Self-pay | Admitting: Obstetrics

## 2018-11-12 VITALS — BP 153/76 | HR 100

## 2018-11-12 DIAGNOSIS — I10 Essential (primary) hypertension: Secondary | ICD-10-CM

## 2018-11-12 DIAGNOSIS — Z98891 History of uterine scar from previous surgery: Secondary | ICD-10-CM | POA: Diagnosis not present

## 2018-11-12 DIAGNOSIS — Z3A25 25 weeks gestation of pregnancy: Secondary | ICD-10-CM

## 2018-11-12 DIAGNOSIS — O0992 Supervision of high risk pregnancy, unspecified, second trimester: Secondary | ICD-10-CM | POA: Diagnosis not present

## 2018-11-12 DIAGNOSIS — O0990 Supervision of high risk pregnancy, unspecified, unspecified trimester: Secondary | ICD-10-CM

## 2018-11-12 NOTE — Progress Notes (Addendum)
S/ pt via tele-visit, pt's phone does not work for virtual visits. Patient reports fetal movement with a little pressure. Pt is currently on labetalol, states that she did not take it yet today. BP is 153/76, pulse 100. Pt states that when she takes medication it makes her nauseous and drowsy. Pt states that she has been seeing floaters, headaches and occasional dizziness.

## 2018-11-12 NOTE — Progress Notes (Signed)
   TELEHEALTH VIRTUAL OBSTETRICS VISIT ENCOUNTER NOTE  I connected with Jackie Chavez on 11/12/18 at 10:15 AM EDT by telephone at home and verified that I am speaking with the correct person using two identifiers.   I discussed the limitations, risks, security and privacy concerns of performing an evaluation and management service by telephone and the availability of in person appointments. I also discussed with the patient that there may be a patient responsible charge related to this service. The patient expressed understanding and agreed to proceed.  Subjective:  Jackie Chavez is a 23 y.o. G2P0102 at [redacted]w[redacted]d being followed for ongoing prenatal care.  She is currently monitored for the following issues for this high-risk pregnancy and has Chronic hypertension; Supervision of high risk pregnancy, antepartum; and History of cesarean section on their problem list.  Patient reports headache and nausea. Reports fetal movement. Denies any contractions, bleeding or leaking of fluid.   The following portions of the patient's history were reviewed and updated as appropriate: allergies, current medications, past family history, past medical history, past social history, past surgical history and problem list.   Objective:   General:  Alert, oriented and cooperative.   Mental Status: Normal mood and affect perceived. Normal judgment and thought content.  Rest of physical exam deferred due to type of encounter  Assessment and Plan:  Pregnancy: G2P0102 at [redacted]w[redacted]d 1. Supervision of high risk pregnancy, antepartum  2. History of cesarean section  3. Chronic hypertension - taking Labetalol 200 mg bid, with BP's 150's / 70's - having nausea, dizziness and headaches after taking Labetalol - continue Labetalol as prescribed.  Preeclampsia precautions given    Preterm labor symptoms and general obstetric precautions including but not limited to vaginal bleeding, contractions, leaking of fluid and  fetal movement were reviewed in detail with the patient.  I discussed the assessment and treatment plan with the patient. The patient was provided an opportunity to ask questions and all were answered. The patient agreed with the plan and demonstrated an understanding of the instructions. The patient was advised to call back or seek an in-person office evaluation/go to MAU at [facility] for any urgent or concerning symptoms. Please refer to After Visit Summary for other counseling recommendations.   I provided 10 minutes of non-face-to-face time during this encounter.    Future Appointments  Date Time Provider [place]  12/04/2018  1:30 PM [place] [facility]  12/04/2018  1:30 PM [place] 1 [facility]    Baltazar Najjar, [place] for [facility] 11-12-2018

## 2018-11-19 ENCOUNTER — Encounter: Payer: Medicaid Other | Admitting: Certified Nurse Midwife

## 2018-11-19 ENCOUNTER — Encounter: Payer: Medicaid Other | Admitting: Obstetrics & Gynecology

## 2018-11-20 ENCOUNTER — Telehealth: Payer: Self-pay | Admitting: Obstetrics & Gynecology

## 2018-11-27 ENCOUNTER — Other Ambulatory Visit: Payer: Self-pay

## 2018-11-27 ENCOUNTER — Ambulatory Visit (INDEPENDENT_AMBULATORY_CARE_PROVIDER_SITE_OTHER): Payer: Medicaid Other | Admitting: Family Medicine

## 2018-11-27 VITALS — BP 134/83 | HR 102 | Wt 218.7 lb

## 2018-11-27 DIAGNOSIS — Z3A27 27 weeks gestation of pregnancy: Secondary | ICD-10-CM

## 2018-11-27 DIAGNOSIS — I10 Essential (primary) hypertension: Secondary | ICD-10-CM

## 2018-11-27 DIAGNOSIS — O9989 Other specified diseases and conditions complicating pregnancy, childbirth and the puerperium: Secondary | ICD-10-CM

## 2018-11-27 DIAGNOSIS — O0992 Supervision of high risk pregnancy, unspecified, second trimester: Secondary | ICD-10-CM

## 2018-11-27 DIAGNOSIS — O0990 Supervision of high risk pregnancy, unspecified, unspecified trimester: Secondary | ICD-10-CM

## 2018-11-27 DIAGNOSIS — O10012 Pre-existing essential hypertension complicating pregnancy, second trimester: Secondary | ICD-10-CM

## 2018-11-27 DIAGNOSIS — M549 Dorsalgia, unspecified: Secondary | ICD-10-CM | POA: Diagnosis not present

## 2018-11-27 MED ORDER — COMFORT FIT MATERNITY SUPP LG MISC: 0 refills | Status: DC

## 2018-11-27 NOTE — Progress Notes (Signed)
Patient reports fetal movement with some pressure and occasional uterine irritability. Pt states that she stopped taking labetalol last week because it was making her feel sick and dizzy.

## 2018-11-27 NOTE — Progress Notes (Signed)
PRENATAL VISIT NOTE Subjective:  Jackie Chavez is a 23 y.o. G2P0102 at [redacted]w[redacted]d being seen today for ongoing prenatal care.  She is currently monitored for the following issues for this high-risk pregnancy and has Chronic hypertension; Supervision of high risk pregnancy, antepartum; and History of cesarean section on their problem list.  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 Vag. Bleeding: None.  Movement: Present. Denies leaking of fluid.   The following portions of the patient's history were reviewed and updated as appropriate: allergies, current medications, past family history, past medical history, past social history, past surgical history and problem list.   Objective:   Vitals:   11/27/18 1358  BP: 134/83  Pulse: (!) 102  Weight: 218 lb 11.2 oz (99.2 kg)    Fetal Status: Fetal Heart Rate (bpm): 142 Fundal Height: 29 cm Movement: Present     General:  Alert, oriented and cooperative. Patient is in no acute distress.  Skin: Skin is warm and dry. No rash noted.   Cardiovascular: Normal heart rate noted  Respiratory: Normal respiratory effort, no problems with respiration noted  Abdomen: Soft, gravid, appropriate for gestational age.  Pain/Pressure: Present     Pelvic: Cervical exam deferred        Extremities: Normal range of motion.  Edema: Trace  Mental Status: Normal mood and affect. Normal behavior. Normal judgment and thought content.   Assessment and Plan:  Pregnancy: G2P0102 at [redacted]w[redacted]d   Supervision of High Risk Pregnancy -- 28-week labs ordered, patient to return next week -- counseled on Tdap -- Rx for pregnancy support belt  Chronic HTN: Asymptomatic today. BP normal today off of labetalol for last week (not tolerated). Discussed she will likely still need a BP medication at some point in pregnancy based on reported elevated BP at home. BP Readings from Last 3 Encounters:  11/27/18 134/83  11/12/18 (!) 153/76  11/06/18 (!) 153/69  -- encouraged to upload BP to babyscripts app or bring in copy of numbers -- discussed that unable to provide letter for bedrest or medical leave but would write letter stating being monitored for BP per patient request. Discussed that still feel it is appropriate for her to keep working at this time -- next growth U/S already scheduled -- continue ASA -- return in 1 week for BP check and to discuss possible initiation of procardia or other anti-hypertensive   Preterm labor symptoms and general obstetric precautions including but not limited to vaginal bleeding, contractions, leaking of fluid and fetal movement were reviewed in detail with the patient. Please refer to After Visit Summary for other counseling recommendations.   Return in about 2 weeks (around 12/11/2018) for BP check .  Future Appointments  Date Time Provider [place]  12/04/2018  8:30 AM [facility] None  12/04/2018  8:45 AM Gavin Pound, CNM [facility] None  12/04/2018  1:30 PM [place] [facility]  12/04/2018  1:30 PM [facility] [place] 1 [facility]  12/11/2018  1:15 PM [facility] NURSE [facility] None    Tamera StandsLaurel S Curtez Brallier, DO

## 2018-11-27 NOTE — Patient Instructions (Addendum)

## 2018-12-04 ENCOUNTER — Ambulatory Visit (HOSPITAL_COMMUNITY)
Admission: RE | Admit: 2018-12-04 | Discharge: 2018-12-04 | Disposition: A | Discharge status: Home / Self Care | Payer: Medicaid Other | Source: Ambulatory Visit | Attending: Obstetrics and Gynecology | Admitting: Obstetrics and Gynecology

## 2018-12-04 ENCOUNTER — Encounter (HOSPITAL_COMMUNITY): Payer: Self-pay

## 2018-12-04 ENCOUNTER — Ambulatory Visit (HOSPITAL_COMMUNITY): Payer: Medicaid Other

## 2018-12-04 ENCOUNTER — Other Ambulatory Visit: Payer: Medicaid Other

## 2018-12-10 ENCOUNTER — Other Ambulatory Visit: Payer: Medicaid Other

## 2018-12-10 ENCOUNTER — Other Ambulatory Visit: Payer: Self-pay

## 2018-12-11 ENCOUNTER — Other Ambulatory Visit: Payer: Medicaid Other

## 2018-12-11 ENCOUNTER — Encounter: Payer: Medicaid Other | Admitting: Obstetrics and Gynecology

## 2018-12-22 ENCOUNTER — Inpatient Hospital Stay (HOSPITAL_COMMUNITY): Payer: Medicaid Other

## 2018-12-22 ENCOUNTER — Inpatient Hospital Stay (HOSPITAL_COMMUNITY)
Admission: AD | Admit: 2018-12-22 | Discharge: 2018-12-22 | Disposition: A | Discharge status: Home / Self Care | Payer: Medicaid Other | Attending: Obstetrics & Gynecology | Admitting: Obstetrics & Gynecology

## 2018-12-22 ENCOUNTER — Encounter (HOSPITAL_COMMUNITY): Payer: Self-pay | Admitting: *Deleted

## 2018-12-22 ENCOUNTER — Other Ambulatory Visit: Payer: Self-pay

## 2018-12-22 DIAGNOSIS — R0602 Shortness of breath: Secondary | ICD-10-CM | POA: Diagnosis not present

## 2018-12-22 DIAGNOSIS — O10913 Unspecified pre-existing hypertension complicating pregnancy, third trimester: Secondary | ICD-10-CM

## 2018-12-22 DIAGNOSIS — Z3A30 30 weeks gestation of pregnancy: Secondary | ICD-10-CM | POA: Diagnosis not present

## 2018-12-22 DIAGNOSIS — Z20828 Contact with and (suspected) exposure to other viral communicable diseases: Secondary | ICD-10-CM | POA: Diagnosis not present

## 2018-12-22 DIAGNOSIS — R05 Cough: Secondary | ICD-10-CM | POA: Diagnosis not present

## 2018-12-22 DIAGNOSIS — R059 Cough, unspecified: Secondary | ICD-10-CM

## 2018-12-22 DIAGNOSIS — O99513 Diseases of the respiratory system complicating pregnancy, third trimester: Secondary | ICD-10-CM | POA: Diagnosis not present

## 2018-12-22 DIAGNOSIS — Z87891 Personal history of nicotine dependence: Secondary | ICD-10-CM | POA: Diagnosis not present

## 2018-12-22 DIAGNOSIS — J209 Acute bronchitis, unspecified: Secondary | ICD-10-CM | POA: Diagnosis not present

## 2018-12-22 DIAGNOSIS — O10013 Pre-existing essential hypertension complicating pregnancy, third trimester: Secondary | ICD-10-CM | POA: Diagnosis not present

## 2018-12-22 LAB — CBC
HCT: 30.4 % — ABNORMAL LOW (ref 36.0–46.0)
Hemoglobin: 10.2 g/dL — ABNORMAL LOW (ref 12.0–15.0)
MCH: 30.6 pg (ref 26.0–34.0)
MCHC: 33.6 g/dL (ref 30.0–36.0)
MCV: 91.3 fL (ref 80.0–100.0)
Platelets: 195 10*3/uL (ref 150–400)
RBC: 3.33 MIL/uL — ABNORMAL LOW (ref 3.87–5.11)
RDW: 14 % (ref 11.5–15.5)
WBC: 10 10*3/uL (ref 4.0–10.5)
nRBC: 0 % (ref 0.0–0.2)

## 2018-12-22 LAB — SARS CORONAVIRUS 2 BY RT PCR (HOSPITAL ORDER, PERFORMED IN ~~LOC~~ HOSPITAL LAB): SARS Coronavirus 2: NEGATIVE

## 2018-12-22 LAB — COMPREHENSIVE METABOLIC PANEL
ALT: 13 U/L (ref 0–44)
AST: 13 U/L — ABNORMAL LOW (ref 15–41)
Albumin: 2.8 g/dL — ABNORMAL LOW (ref 3.5–5.0)
Alkaline Phosphatase: 105 U/L (ref 38–126)
Anion gap: 9 (ref 5–15)
BUN: 7 mg/dL (ref 6–20)
CO2: 22 mmol/L (ref 22–32)
Calcium: 8.5 mg/dL — ABNORMAL LOW (ref 8.9–10.3)
Chloride: 105 mmol/L (ref 98–111)
Creatinine, Ser: 0.57 mg/dL (ref 0.44–1.00)
GFR calc Af Amer: >60 mL/min (ref >60)
GFR calc non Af Amer: >60 mL/min (ref >60)
Glucose, Bld: 84 mg/dL (ref 70–99)
Potassium: 3.6 mmol/L (ref 3.5–5.1)
Sodium: 136 mmol/L (ref 135–145)
Total Bilirubin: 0.2 mg/dL — ABNORMAL LOW (ref 0.3–1.2)
Total Protein: 5.8 g/dL — ABNORMAL LOW (ref 6.5–8.1)

## 2018-12-22 LAB — PROTEIN / CREATININE RATIO, URINE
Creatinine, Urine: 195.77 mg/dL
Protein Creatinine Ratio: 0.09 mg/mg{Cre} (ref 0.00–0.15)
Total Protein, Urine: 18 mg/dL

## 2018-12-22 LAB — URINALYSIS, ROUTINE W REFLEX MICROSCOPIC
Bilirubin Urine: NEGATIVE
Glucose, UA: NEGATIVE mg/dL
Hgb urine dipstick: NEGATIVE
Ketones, ur: NEGATIVE mg/dL
Leukocytes,Ua: NEGATIVE
Nitrite: NEGATIVE
Protein, ur: NEGATIVE mg/dL
Specific Gravity, Urine: 1.027 (ref 1.005–1.030)
pH: 6 (ref 5.0–8.0)

## 2018-12-22 MED ORDER — IPRATROPIUM-ALBUTEROL 0.5-2.5 (3) MG/3ML IN SOLN
3.0000 mL | Freq: Once | RESPIRATORY_TRACT | Status: AC
  Administered 2018-12-22: 3 mL via RESPIRATORY_TRACT
  Filled 2018-12-22 (×2): qty 3

## 2018-12-22 MED ORDER — NIFEDIPINE ER OSMOTIC RELEASE 30 MG PO TB24: 30.0000 mg | ORAL_TABLET | Freq: Every day | ORAL | 0 refills | Status: DC

## 2018-12-22 MED ORDER — ALBUTEROL SULFATE HFA 108 (90 BASE) MCG/ACT IN AERS: 2.0000 | INHALATION_SPRAY | RESPIRATORY_TRACT | 0 refills | Status: DC | PRN

## 2018-12-22 NOTE — Discharge Instructions (Signed)
Safe Medications in Pregnancy   Acne: Benzoyl Peroxide Salicylic Acid  Backache/Headache: Tylenol: 2 regular strength every 4 hours OR              2 Extra strength every 6 hours  Colds/Coughs/Allergies: Benadryl (alcohol free) 25 mg every 6 hours as needed Breath right strips Claritin Cepacol throat lozenges Chloraseptic throat spray Cold-Eeze- up to three times per day Cough drops, alcohol free Flonase (by prescription only) Guaifenesin Mucinex Robitussin DM (plain only, alcohol free) Saline nasal spray/drops Sudafed (pseudoephedrine) & Actifed ** use only after [redacted] weeks gestation and if you do not have high blood pressure Tylenol Vicks Vaporub Zinc lozenges Zyrtec   Constipation: Colace Ducolax suppositories Fleet enema Glycerin suppositories Metamucil Milk of magnesia Miralax Senokot Smooth move tea  Diarrhea: Kaopectate Imodium A-D  *NO pepto Bismol  Hemorrhoids: Anusol Anusol HC Preparation H Tucks  Indigestion: Tums Maalox Mylanta Zantac  Pepcid  Insomnia: Benadryl (alcohol free) 25mg  every 6 hours as needed Tylenol PM Unisom, no Gelcaps  Leg Cramps: Tums MagGel  Nausea/Vomiting:  Bonine Dramamine Emetrol Ginger extract Sea bands Meclizine  Nausea medication to take during pregnancy:  Unisom (doxylamine succinate 25 mg tablets) Take one tablet daily at bedtime. If symptoms are not adequately controlled, the dose can be increased to a maximum recommended dose of two tablets daily (1/2 tablet in the morning, 1/2 tablet mid-afternoon and one at bedtime). Vitamin B6 100mg  tablets. Take one tablet twice a day (up to 200 mg per day).  Skin Rashes: Aveeno products Benadryl cream or 25mg  every 6 hours as needed Calamine Lotion 1% cortisone cream  Yeast infection: Gyne-lotrimin 7 Monistat 7  Gum/tooth pain: Anbesol  **If taking multiple medications, please check labels to avoid duplicating the same active ingredients **take  medication as directed on the label ** Do not exceed 4000 mg of tylenol in 24 hours **Do not take medications that contain aspirin or ibuprofen      Acute Bronchitis, Adult  Acute bronchitis is sudden (acute) swelling of the air tubes (bronchi) in the lungs. Acute bronchitis causes these tubes to fill with mucus, which can make it hard to breathe. It can also cause coughing or wheezing. In adults, acute bronchitis usually goes away within 2 weeks. A cough caused by bronchitis may last up to 3 weeks. Smoking, allergies, and asthma can make the condition worse. Repeated episodes of bronchitis may cause further lung problems, such as chronic obstructive pulmonary disease (COPD). What are the causes? This condition can be caused by germs and by substances that irritate the lungs, including:  Cold and flu viruses. This condition is most often caused by the same virus that causes a cold.  Bacteria.  Exposure to tobacco smoke, dust, fumes, and air pollution. What increases the risk? This condition is more likely to develop in people who:  Have close contact with someone with acute bronchitis.  Are exposed to lung irritants, such as tobacco smoke, dust, fumes, and vapors.  Have a weak immune system.  Have a respiratory condition such as asthma. What are the signs or symptoms? Symptoms of this condition include:  A cough.  Coughing up clear, yellow, or green mucus.  Wheezing.  Chest congestion.  Shortness of breath.  A fever.  Body aches.  Chills.  A sore throat. How is this diagnosed? This condition is usually diagnosed with a physical exam. During the exam, your health care provider may order tests, such as chest X-rays, to rule out other conditions. He  or she may also:  Test a sample of your mucus for bacterial infection.  Check the level of oxygen in your blood. This is done to check for pneumonia.  Do a chest X-ray or lung function testing to rule out pneumonia  and other conditions.  Perform blood tests. Your health care provider will also ask about your symptoms and medical history. How is this treated? Most cases of acute bronchitis clear up over time without treatment. Your health care provider may recommend:  Drinking more fluids. Drinking more makes your mucus thinner, which may make it easier to breathe.  Taking a medicine for a fever or cough.  Taking an antibiotic medicine.  Using an inhaler to help improve shortness of breath and to control a cough.  Using a cool mist vaporizer or humidifier to make it easier to breathe. Follow these instructions at home: Medicines  Take over-the-counter and prescription medicines only as told by your health care provider.  If you were prescribed an antibiotic, take it as told by your health care provider. Do not stop taking the antibiotic even if you start to feel better. General instructions   Get plenty of rest.  Drink enough fluids to keep your urine pale yellow.  Avoid smoking and secondhand smoke. Exposure to cigarette smoke or irritating chemicals will make bronchitis worse. If you smoke and you need help quitting, ask your health care provider. Quitting smoking will help your lungs heal faster.  Use an inhaler, cool mist vaporizer, or humidifier as told by your health care provider.  Keep all follow-up visits as told by your health care provider. This is important. How is this prevented? To lower your risk of getting this condition again:  Wash your hands often with soap and water. If soap and water are not available, use hand sanitizer.  Avoid contact with people who have cold symptoms.  Try not to touch your hands to your mouth, nose, or eyes.  Make sure to get the flu shot every year. Contact a health care provider if:  Your symptoms do not improve in 2 weeks of treatment. Get help right away if:  You cough up blood.  You have chest pain.  You have severe shortness of  breath.  You become dehydrated.  You faint or keep feeling like you are going to faint.  You keep vomiting.  You have a severe headache.  Your fever or chills gets worse. This information is not intended to replace advice given to you by your health care provider. Make sure you discuss any questions you have with your health care provider. Document Released: 06/14/2004 Document Revised: 03/20/2018 Document Reviewed: 10/26/2015 Elsevier Patient Education  2020 Elsevier Inc.     Hypertension During Pregnancy High blood pressure (hypertension) is when the force of blood pumping through the arteries is too strong. Arteries are blood vessels that carry blood from the heart throughout the body. Hypertension during pregnancy can be mild or severe. Severe hypertension during pregnancy (preeclampsia) is a medical emergency that requires prompt evaluation and treatment. Different types of hypertension can happen during pregnancy. These include:  Chronic hypertension. This happens when you had high blood pressure before you became pregnant, and it continues during the pregnancy. Hypertension that develops before you are [redacted] weeks pregnant and continues during the pregnancy is also called chronic hypertension. If you have chronic hypertension, it will not go away after you have your baby. You will need follow-up visits with your health care provider after you  have your baby. Your doctor may want you to keep taking medicine for your blood pressure.  Gestational hypertension. This is hypertension that develops after the 20th week of pregnancy. Gestational hypertension usually goes away after you have your baby, but your health care provider will need to monitor your blood pressure to make sure that it is getting better.  Preeclampsia. This is severe hypertension during pregnancy. This can cause serious complications for you and your baby and can also cause complications for you after the delivery of your  baby.  Postpartum preeclampsia. You may develop severe hypertension after giving birth. This usually occurs within 48 hours after childbirth but may occur up to 6 weeks after giving birth. This is rare. How does this affect me? Women who have hypertension during pregnancy have a greater chance of developing hypertension later in life or during future pregnancies. In some cases, hypertension during pregnancy can cause serious complications, such as:  Stroke.  Heart attack.  Injury to other organs, such as kidneys, lungs, or liver.  Preeclampsia.  Convulsions or seizures.  Placental abruption. How does this affect my baby? Hypertension during pregnancy can affect your baby. Your baby may:  Be born early (prematurely).  Not weigh as much as he or she should at birth (low birth weight).  Not tolerate labor well, leading to an unplanned cesarean delivery. What are the risks? There are certain factors that make it more likely for you to develop hypertension during pregnancy. These include:  Having hypertension during a previous pregnancy.  Being overweight.  Being age 22 or older.  Being pregnant for the first time.  Being pregnant with more than one baby.  Becoming pregnant using fertilization methods, such as IVF (in vitro fertilization).  Having other medical problems, such as diabetes, kidney disease, or lupus.  Having a family history of hypertension. What can I do to lower my risk? The exact cause of hypertension during pregnancy is not known. You may be able to lower your risk by:  Maintaining a healthy weight.  Eating a healthy and balanced diet.  Following your health care provider's instructions about treating any long-term conditions that you had before becoming pregnant. It is very important to keep all of your prenatal care appointments. Your health care provider will check your blood pressure and make sure that your pregnancy is progressing as expected. If a  problem is found, early treatment can prevent complications. How is this treated? Treatment for hypertension during pregnancy varies depending on the type of hypertension you have and how serious it is.  If you were taking medicine for high blood pressure before you became pregnant, talk with your health care provider. You may need to change medicine during pregnancy because some medicines, like ACE inhibitors, may not be considered safe for your baby.  If you have gestational hypertension, your health care provider may order medicine to treat this during pregnancy.  If you are at risk for preeclampsia, your health care provider may recommend that you take a low-dose aspirin during your pregnancy.  If you have severe hypertension, you may need to be hospitalized so you and your baby can be monitored closely. You may also need to be given medicine to lower your blood pressure. This medicine may be given by mouth or through an IV.  In some cases, if your condition gets worse, you may need to deliver your baby early. Follow these instructions at home: Eating and drinking   Drink enough fluid to keep your  urine pale yellow.  Avoid caffeine. Lifestyle  Do not use any products that contain nicotine or tobacco, such as cigarettes, e-cigarettes, and chewing tobacco. If you need help quitting, ask your health care provider.  Do not use alcohol or drugs.  Avoid stress as much as possible.  Rest and get plenty of sleep.  Regular exercise can help to reduce your blood pressure. Ask your health care provider what kinds of exercise are best for you. General instructions  Take over-the-counter and prescription medicines only as told by your health care provider.  Keep all prenatal and follow-up visits as told by your health care provider. This is important. Contact a health care provider if:  You have symptoms that your health care provider told you may require more treatment or monitoring,  such as: ? Headaches. ? Nausea or vomiting. ? Abdominal pain. ? Dizziness. ? Light-headedness. Get help right away if:  You have: ? Severe abdominal pain that does not get better with treatment. ? A severe headache that does not get better. ? Vomiting that does not get better. ? Sudden, rapid weight gain. ? Sudden swelling in your hands, ankles, or face. ? Vaginal bleeding. ? Blood in your urine. ? Blurred or double vision. ? Shortness of breath or chest pain. ? Weakness on one side of your body. ? Difficulty speaking.  Your baby is not moving as much as usual. Summary  High blood pressure (hypertension) is when the force of blood pumping through the arteries is too strong.  Hypertension during pregnancy can cause problems for you and your baby.  Treatment for hypertension during pregnancy varies depending on the type of hypertension you have and how serious it is.  Keep all prenatal and follow-up visits as told by your health care provider. This is important. This information is not intended to replace advice given to you by your health care provider. Make sure you discuss any questions you have with your health care provider. Document Released: 01/23/2011 Document Revised: 08/28/2018 Document Reviewed: 06/03/2018 Elsevier Patient Education  2020 Elsevier Inc.     Fetal Movement Counts Patient Name: ________________________________________________ Patient Due Date: ____________________ What is a fetal movement count?  A fetal movement count is the number of times that you feel your baby move during a certain amount of time. This may also be called a fetal kick count. A fetal movement count is recommended for every pregnant woman. You may be asked to start counting fetal movements as early as week 28 of your pregnancy. Pay attention to when your baby is most active. You may notice your baby's sleep and wake cycles. You may also notice things that make your baby move more.  You should do a fetal movement count:  When your baby is normally most active.  At the same time each day. A good time to count movements is while you are resting, after having something to eat and drink. How do I count fetal movements? 1. Find a quiet, comfortable area. Sit, or lie down on your side. 2. Write down the date, the start time and stop time, and the number of movements that you felt between those two times. Take this information with you to your health care visits. 3. For 2 hours, count kicks, flutters, swishes, rolls, and jabs. You should feel at least 10 movements during 2 hours. 4. You may stop counting after you have felt 10 movements. 5. If you do not feel 10 movements in 2 hours, have something to  eat and drink. Then, keep resting and counting for 1 hour. If you feel at least 4 movements during that hour, you may stop counting. Contact a health care provider if:  You feel fewer than 4 movements in 2 hours.  Your baby is not moving like he or she usually does. Date: ____________ Start time: ____________ Stop time: ____________ Movements: ____________ Date: ____________ Start time: ____________ Stop time: ____________ Movements: ____________ Date: ____________ Start time: ____________ Stop time: ____________ Movements: ____________ Date: ____________ Start time: ____________ Stop time: ____________ Movements: ____________ Date: ____________ Start time: ____________ Stop time: ____________ Movements: ____________ Date: ____________ Start time: ____________ Stop time: ____________ Movements: ____________ Date: ____________ Start time: ____________ Stop time: ____________ Movements: ____________ Date: ____________ Start time: ____________ Stop time: ____________ Movements: ____________ Date: ____________ Start time: ____________ Stop time: ____________ Movements: ____________ This information is not intended to replace advice given to you by your health care provider. Make  sure you discuss any questions you have with your health care provider. Document Released: 06/06/2006 Document Revised: 05/27/2018 Document Reviewed: 06/16/2015 Elsevier Patient Education  2020 [employer].

## 2018-12-22 NOTE — MAU Note (Signed)
.   Jackie Chavez is a 23 y.o. at [redacted]w[redacted]d here in MAU reporting: N/V/D with a cough that started a couple of days ago but worse today. PT denies any vaginal bleeding at present. Pt states she was taking labetalol but it made her sick and was told she did not have to take it. She is taking baby ASA due to PREE  Onset of complaint: couple of days Pain score: 8 Vitals:   12/22/18 1834  BP: (!) 169/83  Pulse: 100  Resp: 18  Temp: 98 F (36.7 C)     FHT 130 Lab orders placed from triage: UA

## 2018-12-22 NOTE — MAU Note (Addendum)
Pt notified of negative covid test. She was sleeping, 02 sats 95%. Respiratory therapist notified to come administer nebulizer. She said she cannot come now but will be there.  Provider notifed.  2110-  RT at bedside

## 2018-12-22 NOTE — MAU Provider Note (Signed)
Chief Complaint:  Shortness of Breath, Nausea, Emesis, and Diarrhea   First Provider Initiated Contact with Patient 12/22/18 1846     HPI: Jackie Chavez is a 23 y.o. G2P0102 at 38w6dwho presents to maternity admissions reporting cough, sore throat, SOB. Symptoms started yesterday. Denies sick contacts or recent travel. Symptoms include sore/scratchy throat, non productive cough, wheezing, and SOB. Reports that she coughs so much if causes her to vomit. SOB is worse with walking. Had some diarrhea as well. No fever/chills. No history of asthma. No chest pain. Has not treated her symptoms.  Hx of CHTN. Was on labetalol but discontinued it due to side effects. Was supposed to discuss replacement with her ob/gyn but she didn't keep her subsequent appointments & hasn't been seen in the office in over a month. Denies headache, visual disturbance, or epigastric pain.  Denies contractions, leakage of fluid or vaginal bleeding. Good fetal movement.  Location: throat Quality: burning Severity: 8/10 in pain scale Duration: 2 days Timing: constant Modifying factors: worse with swallowing Associated signs and symptoms: SOB, cough  Pregnancy Course: CHTN. Limited prenatal care. Hx of preterm c/s due to preeclampsia  Past Medical History:  Diagnosis Date  . Anemia   . Anxiety   . BV (bacterial vaginosis)   . Chronic hypertension   . Headache    migraines  . Hx of chlamydia infection 10/17/2016  . Pregnancy induced hypertension    preeclampsia   OB History  Gravida Para Term Preterm AB Living  '2 1   1   2  ' SAB TAB Ectopic Multiple Live Births        1 2    # Outcome Date GA Lbr Len/2nd Weight Sex Delivery Anes PTL Lv  2 Current           1A Preterm 05/05/17 [id number] g F CS-LTranv Gen  LIV     Birth Comments: preterm     Complications: Preeclampsia  1B Preterm 05/05/17 [id number] g F CS-LTranv Gen  LIV     Birth Comments: perterm     Complications: Preeclampsia   Past Surgical  History:  Procedure Laterality Date  . CESAREAN SECTION MULTI-GESTATIONAL N/A 05/05/2017   Procedure: CESAREAN SECTION MULTI-GESTATIONAL;  Surgeon: ConMora BellmanD;  [facility];  Laterality: N/A;  . WOUND DEBRIDEMENT N/A 05/10/2017   Procedure: DEBRIDEMENT ABDOMINAL WOUND;  Surgeon: EurFlorian BuffD;  [facility];  Service: Gynecology;  Laterality: N/A;  physcian is requesting a wound vac   Family History  Problem Relation Age of Onset  . COPD Mother   . Diabetes Mother   . Mental illness Mother   . Alcohol abuse Mother   . Drug abuse Mother   . Diabetes Father   . Hypertension Father   . Alcohol abuse Father   . Drug abuse Father    Social History   Tobacco Use  . 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 Alcohol use: No  . Drug use: No   Allergies  Allergen Reactions  . Coconut Flavor Anaphylaxis  . Latex Rash   No medications prior to admission.    I have reviewed patient's Past Medical Hx, Surgical Hx, Family Hx, Social Hx, medications and allergies.   ROS:  Review of Systems  Constitutional: Negative.   Eyes: Negative for visual disturbance.  Respiratory: Positive for cough, shortness of breath and wheezing. Negative for  chest tightness.   Cardiovascular: Negative for chest pain.  Gastrointestinal: Positive for diarrhea, nausea and vomiting. Negative for abdominal pain.  Genitourinary: Negative.   Neurological: Negative for headaches.    Physical Exam   Patient Vitals for the past 24 hrs:  BP Temp Pulse Resp SpO2  12/22/18 2202 (!) 126/45 - 84 - -  12/22/18 2101 (!) 108/93 - - - -  12/22/18 2045 (!) 123/51 - - - -  12/22/18 2031 (!) 120/54 - - - -  12/22/18 2030 - - - - 94 %  12/22/18 2016 (!) 125/56 - 87 - -  12/22/18 2005 - - - - 98 %  12/22/18 2000 (!) 126/56 - 85 - 94 %  12/22/18 1946 (!) 126/55 - 87 - -  12/22/18 1940 - - - - 92 %  12/22/18 1915  136/69 - 93 - 93 %  12/22/18 1905 - - - - 94 %  12/22/18 1901 118/88 - 89 - -  12/22/18 1900 - - - - 96 %  12/22/18 1855 - - - - 97 %  12/22/18 1846 (!) 148/64 - 95 - -  12/22/18 1834 (!) 169/83 98 F (36.7 C) 100 18 -    Constitutional: Well-developed, well-nourished female in no acute distress.  Cardiovascular: normal rate & rhythm, no murmur Respiratory: wheezing throughout lung fields GI: Abd soft, non-tender, gravid appropriate for gestational age. Pos BS x 4 MS: Extremities nontender, no edema, normal ROM Neurologic: Alert and oriented x 4.  HENT: no oropharyngeal erythema or exudate  NST:  Baseline: 130 bpm, Variability: Good {> 6 bpm), Accelerations: Reactive and Decelerations: Absent   Labs: Results for orders placed or performed during the hospital encounter of 12/22/18 (from the past 24 hour(s))  CBC     Status: Abnormal   Collection Time: 12/22/18  7:10 PM  Result Value Ref Range   WBC 10.0 4.0 - 10.5 K/uL   RBC 3.33 (L) 3.87 - 5.11 MIL/uL   Hemoglobin 10.2 (L) 12.0 - 15.0 g/dL   HCT 30.4 (L) 36.0 - 46.0 %   MCV 91.3 80.0 - 100.0 fL   MCH 30.6 26.0 - 34.0 pg   MCHC 33.6 30.0 - 36.0 g/dL   RDW 14.0 11.5 - 15.5 %   Platelets 195 150 - 400 K/uL   nRBC 0.0 0.0 - 0.2 %  Comprehensive metabolic panel     Status: Abnormal   Collection Time: 12/22/18  7:10 PM  Result Value Ref Range   Sodium 136 135 - 145 mmol/L   Potassium 3.6 3.5 - 5.1 mmol/L   Chloride 105 98 - 111 mmol/L   CO2 22 22 - 32 mmol/L   Glucose, Bld 84 70 - 99 mg/dL   BUN 7 6 - 20 mg/dL   Creatinine, Ser 0.57 0.44 - 1.00 mg/dL   Calcium 8.5 (L) 8.9 - 10.3 mg/dL   Total Protein 5.8 (L) 6.5 - 8.1 g/dL   Albumin 2.8 (L) 3.5 - 5.0 g/dL   AST 13 (L) 15 - 41 U/L   ALT 13 0 - 44 U/L   Alkaline Phosphatase 105 38 - 126 U/L   Total Bilirubin 0.2 (L) 0.3 - 1.2 mg/dL   GFR calc non Af Amer >60 >60 mL/min   GFR calc Af Amer >60 >60 mL/min   Anion gap 9 5 - 15  Protein / creatinine ratio, urine     Status:  None   Collection Time: 12/22/18  7:20 PM  Result Value  Ref Range   Creatinine, Urine 195.77 mg/dL   Total Protein, Urine 18 mg/dL   Protein Creatinine Ratio 0.09 0.00 - 0.15 mg/mg[Cre]  Urinalysis, Routine w reflex microscopic     Status: None   Collection Time: 12/22/18  7:20 PM  Result Value Ref Range   Color, Urine YELLOW YELLOW   APPearance CLEAR CLEAR   Specific Gravity, Urine 1.027 1.005 - 1.030   pH 6.0 5.0 - 8.0   Glucose, UA NEGATIVE NEGATIVE mg/dL   Hgb urine dipstick NEGATIVE NEGATIVE   Bilirubin Urine NEGATIVE NEGATIVE   Ketones, ur NEGATIVE NEGATIVE mg/dL   Protein, ur NEGATIVE NEGATIVE mg/dL   Nitrite NEGATIVE NEGATIVE   Leukocytes,Ua NEGATIVE NEGATIVE  SARS Coronavirus 2 [facility] order, Performed in [facility] hospital lab) Nasopharyngeal Nasopharyngeal Swab     Status: None   Collection Time: 12/22/18  7:22 PM   Specimen: Nasopharyngeal Swab  Result Value Ref Range   SARS Coronavirus 2 NEGATIVE NEGATIVE    Imaging:  Dg Chest Port 1 View  Result Date: 12/22/2018 CLINICAL DATA:  Shortness of breath and cough EXAM: PORTABLE CHEST 1 VIEW COMPARISON:  May 09, 2017 FINDINGS: The heart size and mediastinal contours are within normal limits. Both lungs are clear. The visualized skeletal structures are unremarkable. IMPRESSION: No acute cardiopulmonary disease identified. Electronically Signed   By: Abelardo Diesel M.D.   On: 12/22/2018 19:52    MAU Course: Orders Placed This Encounter  Procedures  . SARS Coronavirus 2 [facility] order, Performed in [facility] hospital lab) Nasopharyngeal Nasopharyngeal Swab  . DG Chest Port 1 View  . CBC  . Comprehensive metabolic panel  . Protein / creatinine ratio, urine  . Urinalysis, Routine w reflex microscopic  . Discharge patient   Meds ordered this encounter  Medications  . ipratropium-albuterol (DUONEB) 0.5-2.5 (3) MG/3ML nebulizer solution 3 mL  . albuterol (VENTOLIN HFA) 108 (90 Base) MCG/ACT inhaler    Sig:  Inhale 2 puffs into the lungs every 4 (four) hours as needed for wheezing or shortness of breath.    Dispense:  6.7 g    Refill:  0    Order Specific Question:   Supervising Provider    Answer:   [place] Mode [[id number]]  . NIFEdipine (PROCARDIA-XL/NIFEDICAL-XL) 30 MG 24 hr tablet    Sig: Take 1 tablet (30 mg total) by mouth daily.    Dispense:  30 tablet    Refill:  0    Order Specific Question:   Supervising Provider    Answer:   [place] Mode [[id number]]    MDM: Reactive NST. No ob complaints  Initial BP severe range. Repeat still elevated but not severe. Other BPs normal range. PEC labs negative. Pt asymptomatic. Discussed with Dr. Roselie Awkward. Will start on Procardia XL 30 QD.   Covid testing negative. Chest xray negative. Pt afebrile.  Symptoms improved after duoneb treatment. Will give Rx for albuterol inhaler 2 puffs Q 4 hours for the next 2 days. Discussed OTC meds for her other symptoms.   Patient has missed several ob appointments. Encouraged to reschedule asap & keep appts due to high risk status. Message sent to [facility] to initiate f/u appt.   Assessment: 1. Chronic hypertension with exacerbation during pregnancy in third trimester   2. Cough   3. SOB (shortness of breath)   4. [redacted] weeks gestation of pregnancy   5. Acute bronchitis, unspecified organism     Plan: Discharge home in stable condition.  Discussed reasons to  return to MAU Rx albuterol List of safe OTC meds in pregnancy given  Follow-up [facility]. Schedule an appointment as soon as possible for a visit.   Specialty: Obstetrics and Gynecology Contact information: [address] [place] [facility] Follow up.   Specialty: Obstetrics and Gynecology Why: return for worsening symptoms Contact information: [address] [phone] [place] [phone]          Allergies as of 12/22/2018      Reactions   Coconut Flavor Anaphylaxis   Latex Rash      Medication List    TAKE these medications   albuterol 108 (90 Base) MCG/ACT inhaler Commonly known as: VENTOLIN HFA Inhale 2 puffs into the lungs every 4 (four) hours as needed for wheezing or shortness of breath.   aspirin EC 81 MG tablet Take 1 tablet (81 mg total) by mouth daily. Take after 12 weeks for prevention of preeclampsia later in pregnancy   Blood Pressure Monitor Kit 1 kit by Does not apply route once a week. Check BP weekly.  DX: Z13.6        Z34.90   Comfort Fit Maternity Supp Lg Misc Use as directed.   NIFEdipine 30 MG 24 hr tablet Commonly known as: PROCARDIA-XL/NIFEDICAL-XL Take 1 tablet (30 mg total) by mouth daily.   Prenatal Gummies/DHA & FA 0.4-32.5 MG Chew Chew by mouth.       Jorje Guild, NP 12/23/2018 10:11 AM

## 2018-12-24 ENCOUNTER — Encounter: Payer: Medicaid Other | Admitting: Obstetrics & Gynecology

## 2018-12-24 ENCOUNTER — Other Ambulatory Visit: Payer: Medicaid Other

## 2018-12-31 ENCOUNTER — Encounter: Payer: Medicaid Other | Admitting: Obstetrics and Gynecology

## 2018-12-31 ENCOUNTER — Other Ambulatory Visit: Payer: Medicaid Other

## 2019-01-01 ENCOUNTER — Telehealth: Payer: Self-pay | Admitting: Obstetrics and Gynecology

## 2019-01-13 ENCOUNTER — Inpatient Hospital Stay (HOSPITAL_COMMUNITY)
Admission: AD | Admit: 2019-01-13 | Discharge: 2019-01-13 | Disposition: A | Discharge status: Home / Self Care | Payer: Medicaid Other | Attending: Obstetrics & Gynecology | Admitting: Obstetrics & Gynecology

## 2019-01-13 ENCOUNTER — Encounter (HOSPITAL_COMMUNITY): Payer: Self-pay | Admitting: *Deleted

## 2019-01-13 ENCOUNTER — Other Ambulatory Visit: Payer: Self-pay

## 2019-01-13 DIAGNOSIS — Z3A34 34 weeks gestation of pregnancy: Secondary | ICD-10-CM | POA: Diagnosis not present

## 2019-01-13 DIAGNOSIS — O34219 Maternal care for unspecified type scar from previous cesarean delivery: Secondary | ICD-10-CM | POA: Diagnosis not present

## 2019-01-13 DIAGNOSIS — Z91040 Latex allergy status: Secondary | ICD-10-CM | POA: Insufficient documentation

## 2019-01-13 DIAGNOSIS — Z3689 Encounter for other specified antenatal screening: Secondary | ICD-10-CM | POA: Diagnosis not present

## 2019-01-13 DIAGNOSIS — Z825 Family history of asthma and other chronic lower respiratory diseases: Secondary | ICD-10-CM | POA: Diagnosis not present

## 2019-01-13 DIAGNOSIS — N898 Other specified noninflammatory disorders of vagina: Secondary | ICD-10-CM | POA: Insufficient documentation

## 2019-01-13 DIAGNOSIS — Z87891 Personal history of nicotine dependence: Secondary | ICD-10-CM | POA: Diagnosis not present

## 2019-01-13 DIAGNOSIS — Z833 Family history of diabetes mellitus: Secondary | ICD-10-CM | POA: Diagnosis not present

## 2019-01-13 DIAGNOSIS — O26893 Other specified pregnancy related conditions, third trimester: Secondary | ICD-10-CM | POA: Diagnosis not present

## 2019-01-13 DIAGNOSIS — Z8249 Family history of ischemic heart disease and other diseases of the circulatory system: Secondary | ICD-10-CM | POA: Diagnosis not present

## 2019-01-13 DIAGNOSIS — Z7982 Long term (current) use of aspirin: Secondary | ICD-10-CM | POA: Insufficient documentation

## 2019-01-13 LAB — URINALYSIS, ROUTINE W REFLEX MICROSCOPIC
Bacteria, UA: NONE SEEN
Bilirubin Urine: NEGATIVE
Glucose, UA: NEGATIVE mg/dL
Hgb urine dipstick: NEGATIVE
Ketones, ur: 20 mg/dL — AB
Leukocytes,Ua: NEGATIVE
Nitrite: NEGATIVE
Protein, ur: 30 mg/dL — AB
Specific Gravity, Urine: 1.029 (ref 1.005–1.030)
pH: 6 (ref 5.0–8.0)

## 2019-01-13 LAB — POCT FERN TEST: POCT Fern Test: NEGATIVE

## 2019-01-13 NOTE — Discharge Instructions (Signed)
Glucose Tolerance Test During Pregnancy Why am I having this test? The glucose tolerance test (GTT) is done to check how your body processes sugar (glucose). This is one of several tests used to diagnose diabetes that develops during pregnancy (gestational diabetes mellitus). Gestational diabetes is a temporary form of diabetes that some women develop during pregnancy. It usually occurs during the second trimester of pregnancy and goes away after delivery. Testing (screening) for gestational diabetes usually occurs between 24 and 28 weeks of pregnancy. You may have the GTT test after having a 1-hour glucose screening test if the results from that test indicate that you may have gestational diabetes. You may also have this test if:  You have a history of gestational diabetes.  You have a history of giving birth to very large babies or have experienced repeated fetal loss (stillbirth).  You have signs and symptoms of diabetes, such as: ? Changes in your vision. ? Tingling or numbness in your hands or feet. ? Changes in hunger, thirst, and urination that are not otherwise explained by your pregnancy. What is being tested? This test measures the amount of glucose in your blood at different times during a period of 3 hours. This indicates how well your body is able to process glucose. What kind of sample is taken?  Blood samples are required for this test. They are usually collected by inserting a needle into a blood vessel. How do I prepare for this test?  For 3 days before your test, eat normally. Have plenty of carbohydrate-rich foods.  Follow instructions from your health care provider about: ? Eating or drinking restrictions on the day of the test. You may be asked to not eat or drink anything other than water (fast) starting 8-10 hours before the test. ? Changing or stopping your regular medicines. Some medicines may interfere with this test. Tell a health care provider about:  All  medicines you are taking, including vitamins, herbs, eye drops, creams, and over-the-counter medicines.  Any blood disorders you have.  Any surgeries you have had.  Any medical conditions you have. What happens during the test? First, your blood glucose will be measured. This is referred to as your fasting blood glucose, since you fasted before the test. Then, you will drink a glucose solution that contains a certain amount of glucose. Your blood glucose will be measured again 1, 2, and 3 hours after drinking the solution. This test takes about 3 hours to complete. You will need to stay at the testing location during this time. During the testing period:  Do not eat or drink anything other than the glucose solution.  Do not exercise.  Do not use any products that contain nicotine or tobacco, such as cigarettes and e-cigarettes. If you need help stopping, ask your health care provider. The testing procedure may vary among health care providers and hospitals. How are the results reported? Your results will be reported as milligrams of glucose per deciliter of blood (mg/dL) or millimoles per liter (mmol/L). Your health care provider will compare your results to normal ranges that were established after testing a large group of people (reference ranges). Reference ranges may vary among labs and hospitals. For this test, common reference ranges are:  Fasting: less than 95-105 mg/dL (5.3-5.8 mmol/L).  1 hour after drinking glucose: less than 180-190 mg/dL (10.0-10.5 mmol/L).  2 hours after drinking glucose: less than 155-165 mg/dL (8.6-9.2 mmol/L).  3 hours after drinking glucose: 140-145 mg/dL (7.8-8.1 mmol/L). What do the   results mean? Results within reference ranges are considered normal, meaning that your glucose levels are well-controlled. If two or more of your blood glucose levels are high, you may be diagnosed with gestational diabetes. If only one level is high, your health care  provider may suggest repeat testing or other tests to confirm a diagnosis. Talk with your health care provider about what your results mean. Questions to ask your health care provider Ask your health care provider, or the department that is doing the test:  When will my results be ready?  How will I get my results?  What are my treatment options?  What other tests do I need?  What are my next steps? Summary  The glucose tolerance test (GTT) is one of several tests used to diagnose diabetes that develops during pregnancy (gestational diabetes mellitus). Gestational diabetes is a temporary form of diabetes that some women develop during pregnancy.  You may have the GTT test after having a 1-hour glucose screening test if the results from that test indicate that you may have gestational diabetes. You may also have this test if you have any symptoms or risk factors for gestational diabetes.  Talk with your health care provider about what your results mean. This information is not intended to replace advice given to you by your health care provider. Make sure you discuss any questions you have with your health care provider. Document Released: 11/06/2011 Document Revised: 08/28/2018 Document Reviewed: 12/17/2016 Elsevier Patient Education  2020 Elsevier Inc.  

## 2019-01-13 NOTE — MAU Provider Note (Signed)
History     CSN: [id number]  Arrival date and time: 01/13/19 9702   First Provider Initiated Contact with Patient 01/13/19 [phone]      Chief Complaint  Patient presents with  . Contractions  . Rupture of Membranes   Jackie Chavez is a 23 y.o. G2P0102 at 48w0dwho receives care at [facility].  She presents today for Contractions and Rupture of Membranes.  She states she started having leaking around 130pm while at work.  Patient states she rested and drank some water then returned to work, but started having contractions.  She denies leaking since arrival and states it was "just that one time."  Patient states the fluid was clear, but smelled like "rotten pee." She endorses fetal movement and denies vaginal bleeding or discharge prior to the leaking.  Patient also reports that she hasn't had an OB appt "in awhile" because she broke her phone.       OB History    Gravida  2   Para  1   Term      Preterm  1   AB      Living  2     SAB      TAB      Ectopic      Multiple  1   Live Births  2           Past Medical History:  Diagnosis Date  . Anemia   . Anxiety   . BV (bacterial vaginosis)   . Chronic hypertension   . Headache    migraines  . Hx of chlamydia infection 10/17/2016  . Pregnancy induced hypertension    preeclampsia    Past Surgical History:  Procedure Laterality Date  . CESAREAN SECTION MULTI-GESTATIONAL N/A 05/05/2017   Procedure: CESAREAN SECTION MULTI-GESTATIONAL;  Surgeon: CMora Bellman MD;  [facility]  Service: Obstetrics;  Laterality: N/A;  . WOUND DEBRIDEMENT N/A 05/10/2017   Procedure: DEBRIDEMENT ABDOMINAL WOUND;  Surgeon: EFlorian Buff MD;  [facility];  Service: Gynecology;  Laterality: N/A;  physcian is requesting a wound vac    Family History  Problem Relation Age of Onset  . COPD Mother   . Diabetes Mother   . Mental illness Mother   . Alcohol abuse Mother   . Drug abuse Mother   .  Diabetes Father   . Hypertension Father   . Alcohol abuse Father   . Drug abuse Father     Social History   Tobacco Use  . Smoking status: Former SResearch scientist (life sciences) . Smokeless tobacco: Never Used  Substance Use Topics  . Alcohol use: No  . Drug use: No    Allergies:  Allergies  Allergen Reactions  . Coconut Flavor Anaphylaxis  . Latex Rash    Medications Prior to Admission  Medication Sig Dispense Refill Last Dose  . aspirin EC 81 MG tablet Take 1 tablet (81 mg total) by mouth daily. Take after 12 weeks for prevention of preeclampsia later in pregnancy 300 tablet 2 01/12/2019 at Unknown time  . Prenatal MV-Min-FA-Omega-3 (PRENATAL GUMMIES/DHA & FA) 0.4-32.5 MG CHEW Chew by mouth.   01/12/2019 at Unknown time  . albuterol (VENTOLIN HFA) 108 (90 Base) MCG/ACT inhaler Inhale 2 puffs into the lungs every 4 (four) hours as needed for wheezing or shortness of breath. 6.7 g 0 Unknown at Unknown time  . Blood Pressure Monitor KIT 1 kit by Does not apply route once a week. Check BP weekly.  DX:  Z13.6        Z34.90 1 each 0 Unknown at Unknown time  . Elastic Bandages & Supports (COMFORT FIT MATERNITY SUPP LG) MISC Use as directed. 1 each 0 Unknown at Unknown time  . NIFEdipine (PROCARDIA-XL/NIFEDICAL-XL) 30 MG 24 hr tablet Take 1 tablet (30 mg total) by mouth daily. 30 tablet 0 Unknown at Unknown time    Review of Systems  Constitutional: Negative for chills and fever.  Respiratory: Negative for cough and shortness of breath.   Gastrointestinal: Negative for constipation, diarrhea, nausea and vomiting.  Genitourinary: Negative for difficulty urinating, dysuria, vaginal bleeding and vaginal discharge.  Neurological: Negative for dizziness, light-headedness and headaches.   Physical Exam   Blood pressure 125/72, temperature 98.1 F (36.7 C), temperature source Oral, last menstrual period 11/21/2017, unknown if currently breastfeeding.  Physical Exam  Constitutional: She is oriented to  person, place, and time. She appears well-developed and well-nourished.  HENT:  Head: Normocephalic and atraumatic.  Eyes: Conjunctivae are normal.  Neck: Normal range of motion.  Cardiovascular: Normal rate.  Respiratory: Effort normal.  GI: Soft.  Gravid--fundal height appears AGA, Soft, NT   Musculoskeletal: Normal range of motion.  Neurological: She is oriented to person, place, and time.  Skin: Skin is warm and dry.  Psychiatric: She has a normal mood and affect. Her behavior is normal.    Fetal Assessment 135 bpm, Mod Var, -Decels, +Accels Toco: None graphed  MAU Course   Results for orders placed or performed during the hospital encounter of 01/13/19 (from the past 24 hour(s))  Urinalysis, Routine w reflex microscopic     Status: Abnormal   Collection Time: 01/13/19  3:50 AM  Result Value Ref Range   Color, Urine YELLOW YELLOW   APPearance HAZY (A) CLEAR   Specific Gravity, Urine 1.029 1.005 - 1.030   pH 6.0 5.0 - 8.0   Glucose, UA NEGATIVE NEGATIVE mg/dL   Hgb urine dipstick NEGATIVE NEGATIVE   Bilirubin Urine NEGATIVE NEGATIVE   Ketones, ur 20 (A) NEGATIVE mg/dL   Protein, ur 30 (A) NEGATIVE mg/dL   Nitrite NEGATIVE NEGATIVE   Leukocytes,Ua NEGATIVE NEGATIVE   RBC / HPF 0-5 0 - 5 RBC/hpf   WBC, UA 0-5 0 - 5 WBC/hpf   Bacteria, UA NONE SEEN NONE SEEN   Squamous Epithelial / LPF 11-20 0 - 5   Mucus PRESENT   Fern Test     Status: None   Collection Time: 01/13/19  4:42 AM  Result Value Ref Range   POCT Fern Test Negative = intact amniotic membranes    No results found.  MDM PE Labs: UA, Fern EFM  Assessment and Plan  23 year old G60P0102  SIUP at [place] of Fluid  -Patient declines exam or further evaluation for leaking stating that she feels better. -Extensive discussion regarding what to expect regarding ROM. -Extensive discussion regarding Labor Onset and Precautions. -Extensive discussion regarding necessity of in office visit  for completion of GTT. *Message sent to [facility] instructing to call patient and schedule appt accordingly. *Patient informed that she can possibly come in at 36 weeks to have GTT and GBS completed together. -Provider strongly recommends evaluation of leaking, patient again declines.  -NST reactive -Encouraged to call or return to MAU if symptoms worsen or with the onset of new symptoms. -Discharged to home in stable condition.  Maryann Conners MSN, CNM 01/13/2019, 4:19 AM

## 2019-01-13 NOTE — MAU Note (Signed)
Pt reports to MAU c/o possible SROM @ 0130. Pt reports she was standing at work taking a order when she felt warm fluid run down her leg. Pt reports she was unsure if it was her water or if she peed. Pt states when she went to the bathroom and looked it was a white/brown color. Pt reports ctx that started after. Pt reports the pain is a 8/10 on the pain scale and it is in her back and abdomen and radiates down to her legs. +FM. No bleeding. Pt reports pelvic pressure.

## 2019-01-13 NOTE — MAU Note (Signed)
Patient left without signing AVS signature pad.  Patient not in room when RN returned to review discharge instructions.  Emly, CNM provided thorough instructions to patient when RN was in the room, prior to patient leaving.

## 2019-02-06 ENCOUNTER — Encounter: Payer: Medicaid Other | Admitting: Internal Medicine

## 2019-02-06 DIAGNOSIS — Z5181 Encounter for therapeutic drug level monitoring: Secondary | ICD-10-CM | POA: Diagnosis not present

## 2019-02-12 DIAGNOSIS — Z5181 Encounter for therapeutic drug level monitoring: Secondary | ICD-10-CM | POA: Diagnosis not present

## 2019-02-18 DIAGNOSIS — Z5181 Encounter for therapeutic drug level monitoring: Secondary | ICD-10-CM | POA: Diagnosis not present

## 2019-02-19 ENCOUNTER — Ambulatory Visit (INDEPENDENT_AMBULATORY_CARE_PROVIDER_SITE_OTHER): Payer: Medicaid Other | Admitting: Certified Nurse Midwife

## 2019-02-19 ENCOUNTER — Other Ambulatory Visit: Payer: Self-pay

## 2019-02-19 ENCOUNTER — Other Ambulatory Visit (HOSPITAL_COMMUNITY)
Admission: RE | Admit: 2019-02-19 | Discharge: 2019-02-19 | Disposition: A | Discharge status: Home / Self Care | Payer: Medicaid Other | Source: Ambulatory Visit | Attending: Medical | Admitting: Medical

## 2019-02-19 ENCOUNTER — Encounter: Payer: Self-pay | Admitting: Certified Nurse Midwife

## 2019-02-19 VITALS — BP 141/84 | HR 96 | Wt 231.0 lb

## 2019-02-19 DIAGNOSIS — O0933 Supervision of pregnancy with insufficient antenatal care, third trimester: Secondary | ICD-10-CM | POA: Diagnosis not present

## 2019-02-19 DIAGNOSIS — O0993 Supervision of high risk pregnancy, unspecified, third trimester: Secondary | ICD-10-CM

## 2019-02-19 DIAGNOSIS — O10013 Pre-existing essential hypertension complicating pregnancy, third trimester: Secondary | ICD-10-CM | POA: Diagnosis not present

## 2019-02-19 DIAGNOSIS — O0990 Supervision of high risk pregnancy, unspecified, unspecified trimester: Secondary | ICD-10-CM

## 2019-02-19 DIAGNOSIS — Z3A39 39 weeks gestation of pregnancy: Secondary | ICD-10-CM

## 2019-02-19 DIAGNOSIS — I10 Essential (primary) hypertension: Secondary | ICD-10-CM

## 2019-02-19 DIAGNOSIS — Z98891 History of uterine scar from previous surgery: Secondary | ICD-10-CM

## 2019-02-19 NOTE — Patient Instructions (Signed)
Reasons to return to MAU:  1.  Contractions are  5 minutes apart or less, each last 1 minute, these have been going on for 1-2 hours, and you cannot walk or talk during them 2.  You have a large gush of fluid, or a trickle of fluid that will not stop and you have to wear a pad 3.  You have bleeding that is bright red, heavier than spotting--like menstrual bleeding (spotting can be normal in early labor or after a check of your cervix) 4.  You do not feel the baby moving like he/she normally does   Labor Induction  Labor induction is when steps are taken to cause a pregnant woman to begin the labor process. Most women go into labor on their own between 37 weeks and 42 weeks of pregnancy. When this does not happen or when there is a medical need for labor to begin, steps may be taken to induce labor. Labor induction causes a pregnant woman's uterus to contract. It also causes the cervix to soften (ripen), open (dilate), and thin out (efface). Usually, labor is not induced before 39 weeks of pregnancy unless there is a medical reason to do so. Your health care provider will determine if labor induction is needed. Before inducing labor, your health care provider will consider a number of factors, including:  Your medical condition and your baby's.  How many weeks along you are in your pregnancy.  How mature your baby's lungs are.  The condition of your cervix.  The position of your baby.  The size of your birth canal. What are some reasons for labor induction? Labor may be induced if:  Your health or your baby's health is at risk.  Your pregnancy is overdue by 1 week or more.  Your water breaks but labor does not start on its own.  There is a low amount of amniotic fluid around your baby. You may also choose (elect) to have labor induced at a certain time. Generally, elective labor induction is done no earlier than 39 weeks of pregnancy. What methods are used for labor induction?  Methods used for labor induction include:  Prostaglandin medicine. This medicine starts contractions and causes the cervix to dilate and ripen. It can be taken by mouth (orally) or by being inserted into the vagina (suppository).  Inserting a small, thin tube (catheter) with a balloon into the vagina and then expanding the balloon with water to dilate the cervix.  Stripping the membranes. In this method, your health care provider gently separates amniotic sac tissue from the cervix. This causes the cervix to stretch, which in turn causes the release of a hormone called progesterone. The hormone causes the uterus to contract. This procedure is often done during an office visit, after which you will be sent home to wait for contractions to begin.  Breaking the water. In this method, your health care provider uses a small instrument to make a small hole in the amniotic sac. This eventually causes the amniotic sac to break. Contractions should begin after a few hours.  Medicine to trigger or strengthen contractions. This medicine is given through an IV that is inserted into a vein in your arm. Except for membrane stripping, which can be done in a clinic, labor induction is done in the hospital so that you and your baby can be carefully monitored. How long does it take for labor to be induced? The length of time it takes to induce labor depends on how  ready your body is for labor. Some inductions can take up to 2-3 days, while others may take less than a day. Induction may take longer if:  You are induced early in your pregnancy.  It is your first pregnancy.  Your cervix is not ready. What are some risks associated with labor induction? Some risks associated with labor induction include:  Changes in fetal heart rate, such as being too high, too low, or irregular (erratic).  Failed induction.  Infection in the mother or the baby.  Increased risk of having a cesarean delivery.  Fetal death.   Breaking off (abruption) of the placenta from the uterus (rare).  Rupture of the uterus (very rare). When induction is needed for medical reasons, the benefits of induction generally outweigh the risks. What are some reasons for not inducing labor? Labor induction should not be done if:  Your baby does not tolerate contractions.  You have had previous surgeries on your uterus, such as a myomectomy, removal of fibroids, or a vertical scar from a previous cesarean delivery.  Your placenta lies very low in your uterus and blocks the opening of the cervix (placenta previa).  Your baby is not in a head-down position.  The umbilical cord drops down into the birth canal in front of the baby.  There are unusual circumstances, such as the baby being very early (premature).  You have had more than 2 previous cesarean deliveries. Summary  Labor induction is when steps are taken to cause a pregnant woman to begin the labor process.  Labor induction causes a pregnant woman's uterus to contract. It also causes the cervix to ripen, dilate, and efface.  Labor is not induced before 39 weeks of pregnancy unless there is a medical reason to do so.  When induction is needed for medical reasons, the benefits of induction generally outweigh the risks. This information is not intended to replace advice given to you by your health care provider. Make sure you discuss any questions you have with your health care provider. Document Released: 09/26/2006 Document Revised: 05/10/2017 Document Reviewed: 06/20/2016 Elsevier Patient Education  2020 [employer].

## 2019-02-20 ENCOUNTER — Inpatient Hospital Stay (HOSPITAL_COMMUNITY)
Admission: AD | Admit: 2019-02-20 | Discharge: 2019-02-22 | DRG: 807 | Disposition: A | Discharge status: Home / Self Care | Payer: Medicaid Other | Attending: Obstetrics and Gynecology | Admitting: Obstetrics and Gynecology

## 2019-02-20 ENCOUNTER — Encounter (HOSPITAL_COMMUNITY): Payer: Self-pay

## 2019-02-20 ENCOUNTER — Other Ambulatory Visit: Payer: Self-pay

## 2019-02-20 DIAGNOSIS — O26893 Other specified pregnancy related conditions, third trimester: Secondary | ICD-10-CM | POA: Diagnosis present

## 2019-02-20 DIAGNOSIS — O164 Unspecified maternal hypertension, complicating childbirth: Secondary | ICD-10-CM | POA: Diagnosis not present

## 2019-02-20 DIAGNOSIS — O34219 Maternal care for unspecified type scar from previous cesarean delivery: Secondary | ICD-10-CM | POA: Diagnosis present

## 2019-02-20 DIAGNOSIS — Z87891 Personal history of nicotine dependence: Secondary | ICD-10-CM | POA: Diagnosis not present

## 2019-02-20 DIAGNOSIS — O99214 Obesity complicating childbirth: Secondary | ICD-10-CM | POA: Diagnosis present

## 2019-02-20 DIAGNOSIS — Z20828 Contact with and (suspected) exposure to other viral communicable diseases: Secondary | ICD-10-CM | POA: Diagnosis present

## 2019-02-20 DIAGNOSIS — I10 Essential (primary) hypertension: Secondary | ICD-10-CM | POA: Diagnosis not present

## 2019-02-20 DIAGNOSIS — O1002 Pre-existing essential hypertension complicating childbirth: Secondary | ICD-10-CM | POA: Diagnosis present

## 2019-02-20 DIAGNOSIS — Z3A39 39 weeks gestation of pregnancy: Secondary | ICD-10-CM

## 2019-02-20 DIAGNOSIS — Z3689 Encounter for other specified antenatal screening: Secondary | ICD-10-CM | POA: Diagnosis not present

## 2019-02-20 DIAGNOSIS — O4292 Full-term premature rupture of membranes, unspecified as to length of time between rupture and onset of labor: Secondary | ICD-10-CM | POA: Diagnosis present

## 2019-02-20 DIAGNOSIS — O479 False labor, unspecified: Secondary | ICD-10-CM | POA: Diagnosis not present

## 2019-02-20 DIAGNOSIS — O0990 Supervision of high risk pregnancy, unspecified, unspecified trimester: Secondary | ICD-10-CM

## 2019-02-20 LAB — COMPREHENSIVE METABOLIC PANEL
ALT: 11 U/L (ref 0–44)
AST: 12 U/L — ABNORMAL LOW (ref 15–41)
Albumin: 2.3 g/dL — ABNORMAL LOW (ref 3.5–5.0)
Alkaline Phosphatase: 213 U/L — ABNORMAL HIGH (ref 38–126)
Anion gap: 7 (ref 5–15)
BUN: 7 mg/dL (ref 6–20)
CO2: 23 mmol/L (ref 22–32)
Calcium: 8.5 mg/dL — ABNORMAL LOW (ref 8.9–10.3)
Chloride: 106 mmol/L (ref 98–111)
Creatinine, Ser: 0.56 mg/dL (ref 0.44–1.00)
GFR calc Af Amer: >60 mL/min (ref >60)
GFR calc non Af Amer: >60 mL/min (ref >60)
Glucose, Bld: 82 mg/dL (ref 70–99)
Potassium: 3.8 mmol/L (ref 3.5–5.1)
Sodium: 136 mmol/L (ref 135–145)
Total Bilirubin: 0.7 mg/dL (ref 0.3–1.2)
Total Protein: 5.7 g/dL — ABNORMAL LOW (ref 6.5–8.1)

## 2019-02-20 LAB — RAPID URINE DRUG SCREEN, HOSP PERFORMED
Amphetamines: NOT DETECTED
Barbiturates: NOT DETECTED
Benzodiazepines: NOT DETECTED
Cocaine: NOT DETECTED
Opiates: NOT DETECTED
Tetrahydrocannabinol: NOT DETECTED

## 2019-02-20 LAB — CERVICOVAGINAL ANCILLARY ONLY
Chlamydia: NEGATIVE
Neisseria Gonorrhea: NEGATIVE
Trichomonas: NEGATIVE

## 2019-02-20 LAB — CBC
HCT: 32 % — ABNORMAL LOW (ref 36.0–46.0)
Hematocrit: 32.1 % — ABNORMAL LOW (ref 34.0–46.6)
Hemoglobin: 10.6 g/dL — ABNORMAL LOW (ref 11.1–15.9)
Hemoglobin: 10.5 g/dL — ABNORMAL LOW (ref 12.0–15.0)
MCH: 27.7 pg (ref 26.6–33.0)
MCH: 28.1 pg (ref 26.0–34.0)
MCHC: 33 g/dL (ref 31.5–35.7)
MCHC: 32.8 g/dL (ref 30.0–36.0)
MCV: 84 fL (ref 79–97)
MCV: 85.6 fL (ref 80.0–100.0)
Platelets: 209 10*3/uL (ref 150–450)
Platelets: 226 10*3/uL (ref 150–400)
RBC: 3.83 x10E6/uL (ref 3.77–5.28)
RBC: 3.74 MIL/uL — ABNORMAL LOW (ref 3.87–5.11)
RDW: 13.9 % (ref 11.7–15.4)
RDW: 14.7 % (ref 11.5–15.5)
WBC: 8.6 10*3/uL (ref 3.4–10.8)
WBC: 9.7 10*3/uL (ref 4.0–10.5)
nRBC: 0 % (ref 0.0–0.2)

## 2019-02-20 LAB — TYPE AND SCREEN
ABO/RH(D): AB POS
Antibody Screen: NEGATIVE

## 2019-02-20 LAB — GLUCOSE TOLERANCE, 1 HOUR: Glucose, 1Hr PP: 109 mg/dL (ref 65–199)

## 2019-02-20 LAB — SARS CORONAVIRUS 2 BY RT PCR (HOSPITAL ORDER, PERFORMED IN ~~LOC~~ HOSPITAL LAB): SARS Coronavirus 2: NEGATIVE

## 2019-02-20 LAB — PROTEIN / CREATININE RATIO, URINE
Creatinine, Urine: 37.25 mg/dL
Total Protein, Urine: <6 mg/dL

## 2019-02-20 LAB — RPR: RPR Ser Ql: NONREACTIVE

## 2019-02-20 LAB — HIV ANTIBODY (ROUTINE TESTING W REFLEX): HIV Screen 4th Generation wRfx: NONREACTIVE

## 2019-02-20 MED ORDER — LACTATED RINGERS IV SOLN
500.0000 mL | INTRAVENOUS | Status: DC | PRN
  Administered 2019-02-21: 500 mL via INTRAVENOUS

## 2019-02-20 MED ORDER — LACTATED RINGERS IV SOLN
INTRAVENOUS | Status: DC
  Administered 2019-02-20: 21:00:00 125 mL/h via INTRAVENOUS
  Administered 2019-02-20 – 2019-02-21 (×3): via INTRAVENOUS

## 2019-02-20 MED ORDER — OXYCODONE-ACETAMINOPHEN 5-325 MG PO TABS: 2.0000 | ORAL_TABLET | ORAL | Status: DC | PRN

## 2019-02-20 MED ORDER — SOD CITRATE-CITRIC ACID 500-334 MG/5ML PO SOLN: 30.0000 mL | ORAL | Status: DC | PRN

## 2019-02-20 MED ORDER — SODIUM CHLORIDE 0.9 % IV SOLN
5.0000 10*6.[IU] | Freq: Once | INTRAVENOUS | Status: AC
  Administered 2019-02-20: 5 10*6.[IU] via INTRAVENOUS
  Filled 2019-02-20: qty 5

## 2019-02-20 MED ORDER — ACETAMINOPHEN 325 MG PO TABS: 650.0000 mg | ORAL_TABLET | ORAL | Status: DC | PRN

## 2019-02-20 MED ORDER — OXYCODONE-ACETAMINOPHEN 5-325 MG PO TABS: 1.0000 | ORAL_TABLET | ORAL | Status: DC | PRN

## 2019-02-20 MED ORDER — PENICILLIN G 3 MILLION UNITS IVPB - SIMPLE MED
3.0000 10*6.[IU] | INTRAVENOUS | Status: DC
  Administered 2019-02-20 – 2019-02-21 (×6): 3 10*6.[IU] via INTRAVENOUS
  Filled 2019-02-20 (×6): qty 100

## 2019-02-20 MED ORDER — LIDOCAINE HCL (PF) 1 % IJ SOLN: 30.0000 mL | INTRAMUSCULAR | Status: DC | PRN

## 2019-02-20 MED ORDER — OXYTOCIN BOLUS FROM INFUSION
500.0000 mL | Freq: Once | INTRAVENOUS | Status: AC
  Administered 2019-02-21: 15:00:00 500 mL via INTRAVENOUS

## 2019-02-20 MED ORDER — FENTANYL CITRATE (PF) 100 MCG/2ML IJ SOLN
100.0000 ug | INTRAMUSCULAR | Status: DC | PRN
  Administered 2019-02-21 (×2): 100 ug via INTRAVENOUS
  Filled 2019-02-20 (×2): qty 2

## 2019-02-20 MED ORDER — TERBUTALINE SULFATE 1 MG/ML IJ SOLN: 0.2500 mg | Freq: Once | INTRAMUSCULAR | Status: DC | PRN

## 2019-02-20 MED ORDER — OXYTOCIN 40 UNITS IN NORMAL SALINE INFUSION - SIMPLE MED
2.5000 [IU]/h | INTRAVENOUS | Status: DC
  Administered 2019-02-21: 2.5 [IU]/h via INTRAVENOUS

## 2019-02-20 MED ORDER — OXYTOCIN 40 UNITS IN NORMAL SALINE INFUSION - SIMPLE MED
1.0000 m[IU]/min | INTRAVENOUS | Status: DC
  Administered 2019-02-20: 1 m[IU]/min via INTRAVENOUS
  Filled 2019-02-20: qty 1000

## 2019-02-20 MED ORDER — ONDANSETRON HCL 4 MG/2ML IJ SOLN
4.0000 mg | Freq: Four times a day (QID) | INTRAMUSCULAR | Status: DC | PRN
  Administered 2019-02-21: 4 mg via INTRAVENOUS
  Filled 2019-02-20: qty 2

## 2019-02-20 NOTE — Progress Notes (Signed)
LABOR PROGRESS NOTE  Jackie Chavez is a 23 y.o. G3P0102 at [redacted]w[redacted]d  admitted for SROM..  Subjective: Starting to feel contractions more strongly since FB fell out  Objective: BP (!) 125/54   Pulse 92   Temp 98.5 F (36.9 C) (Oral)   Resp 16   Ht 5\' 1"  (1.549 m)   Wt 104.8 kg   LMP 11/21/2017   SpO2 100%   BMI 43.65 kg/m  or  Vitals:   02/20/19 1821 02/20/19 2022 02/20/19 2145 02/20/19 2230  BP: 117/75 134/71 (!) 124/59 (!) 125/54  Pulse: 86 81 81 92  Resp: 18 18 18 16   Temp: (!) 97 F (36.1 C) 98.5 F (36.9 C)    TempSrc: Oral Oral    SpO2:      Weight:      Height:         Dilation: 4 Effacement (%): 70 Cervical Position: Posterior Station: Ballotable Presentation: Vertex Exam by:: Harrell Lark RN  FHT: baseline rate 140, moderate varibility, +acel, -decel Toco: q5-7 min  Labs: Lab Results  Component Value Date   WBC 9.7 02/20/2019   HGB 10.5 (L) 02/20/2019   HCT 32.0 (L) 02/20/2019   MCV 85.6 02/20/2019   PLT 226 02/20/2019    Patient Active Problem List   Diagnosis Date Noted  . Normal labor 02/20/2019  . Limited prenatal care in third trimester 02/19/2019  . Supervision of high risk pregnancy, antepartum 10/29/2018  . History of cesarean section 10/29/2018  . Chronic hypertension 10/23/2018    Assessment / Plan: 23 y.o. G3P0102 at [redacted]w[redacted]d here for SROM.  Labor: SROM at 1000, s/p FB, now on pitocin, uptitrate PRN.  Fetal Wellbeing:  Cat I Pain Control:  Deciding about epidural Anticipated MOD:  SVD TOLAC: prior CS for cord prolapse GBS unknown: on PCN CHTN: previously on labetalol but says she stopped 2 months ago, two mild range pressures two hours apart soon after arrival but normotensive since, PIH labs normal, ctm.   Augustin Coupe, MD/[facility] OB Fellow  02/20/2019, 11:15 PM

## 2019-02-20 NOTE — Progress Notes (Signed)
Jackie Chavez is a 23 y.o. G3P0102 at [redacted]w[redacted]d admitted for rupture of membranes at 1000 (TOLAC).   Subjective: Not feeling contractions anymore, feeling baby move, occasionally leaking fluid  Objective: BP 117/75   Pulse 86   Temp (!) 97 F (36.1 C) (Oral)   Resp 18   Ht 5\' 1"  (1.549 m)   Wt 104.8 kg   LMP 11/21/2017   SpO2 100%   BMI 43.65 kg/m  No intake/output data recorded.  FHT:  FHR: 120-125 bpm, variability: moderate,  accelerations:  Present,  decelerations:  Absent UC:   Irregular, more uterine irritibility  SVE:   Dilation: 2 Effacement (%): 70 Station: -2 Exam by:: Hindy Perrault DO  Labs: Lab Results  Component Value Date   WBC 9.7 02/20/2019   HGB 10.5 (L) 02/20/2019   HCT 32.0 (L) 02/20/2019   MCV 85.6 02/20/2019   PLT 226 02/20/2019    Assessment / Plan: PROM with SROM at 1000 hours  Labor: FB placed as no cervical dilation. Will start pitocin after she eats, low-dose Fetal Wellbeing:  Category I Pain Control:  IV pain meds, epidural as desired I/D:  GBS unknown, CX pending, PCN Anticipated MOD:  hopeful for VBAC, CS as appropriate  Merilyn Baba DO OB Fellow, [facility] 02/20/2019, 7:50 PM

## 2019-02-20 NOTE — Progress Notes (Signed)
   PRENATAL VISIT NOTE  Subjective:  Jackie Chavez is a 23 y.o. G2P0102 at [redacted]w[redacted]d being seen today for ongoing prenatal care.  She is currently monitored for the following issues for this high-risk pregnancy and has Chronic hypertension; Supervision of high risk pregnancy, antepartum; History of cesarean section; and Limited prenatal care in third trimester on their problem list.  Patient reports occasional contractions.  Contractions: Irregular. Vag. Bleeding: None.  Movement: Present. Denies leaking of fluid.   The following portions of the patient's history were reviewed and updated as appropriate: allergies, current medications, past family history, past medical history, past social history, past surgical history and problem list.   Objective:   Vitals:   02/19/19 1542  BP: (!) 141/84  Pulse: 96  Weight: 231 lb (104.8 kg)    Fetal Status: Fetal Heart Rate (bpm): 145 Fundal Height: 39 cm Movement: Present  Presentation: Vertex  General:  Alert, oriented and cooperative. Patient is in no acute distress.  Skin: Skin is warm and dry. No rash noted.   Cardiovascular: Normal heart rate noted  Respiratory: Normal respiratory effort, no problems with respiration noted  Abdomen: Soft, gravid, appropriate for gestational age.  Pain/Pressure: Present     Pelvic: Cervical exam performed Dilation: 1.5 Effacement (%): 50 Station: -3  Extremities: Normal range of motion.     Mental Status: Normal mood and affect. Normal behavior. Normal judgment and thought content.   Assessment and Plan:  Pregnancy: G2P0102 at 101w3d 1. Supervision of high risk pregnancy, antepartum - Patient has not been seen in care since July- she reports "not having time"- single mom with 2yo twins  - Educated and discussed importance of prenatal care d/t high risk pregnancy  - CHTN, has not been taking BP at home- "does not know where cuff is"  - 3rd trimester labs and GC/C plus GBS obtained today  - Anticipatory  guidance on plan of care  - Glucose tolerance, 1 hour - HIV Antibody (routine testing w rflx) - RPR - Cervicovaginal ancillary only( [facility]) - Strep Gp B NAA - CBC  2. History of cesarean section - Hx of C/S preterm for cord prolapse with twins   3. Chronic hypertension - Has not been taking BP at home, she reports she stopped taking her Labetalol "months ago" because "did not like the way it made me feel"  - CHTN to be inducted around 39-40 weeks, patient is [redacted]w[redacted]d today, scheduled for induction on Sunday- patient is aware   4. Limited prenatal care in third trimester - Has not been seen since July  - 3rd trimester labs and GTT obtained today including GBS and GC/C  Term labor symptoms and general obstetric precautions including but not limited to vaginal bleeding, contractions, leaking of fluid and fetal movement were reviewed in detail with the patient. Please refer to After Visit Summary for other counseling recommendations.   Future Appointments  Date Time Provider [place]  02/22/2019  7:15 AM [facility] [place] None    Lajean Manes, CNM

## 2019-02-20 NOTE — MAU Note (Signed)
.  Jackie Chavez is a 23 y.o. at [redacted]w[redacted]d here in MAU reporting: Arrived via EMS for possible rupture of membrane of clear fluid at 1000. No VB and positive fetal movement. Onset of complaint: 1000 Pain score: 3  FHT:130

## 2019-02-20 NOTE — H&P (Addendum)
OBSTETRIC ADMISSION HISTORY AND PHYSICAL  Jackie Chavez is a 23 y.o. female [phone] with IUP at 34w3dpresenting for SOL with SROM at 1000 (TOLAC). She reports +FMs. No LOF, VB, blurry vision, headaches, peripheral edema, or RUQ pain. She plans on breast and bottle feeding. She requests BTL for birth control, but has not signed a consent for this.  Dating: By [place]--->  Estimated Date of Delivery: 02/24/19  Sono:    '@[redacted]w[redacted]d' , CWD, normal anatomy for what was seen (heart not well visualized), cephalic presentation, 6147W 44%ile, EFW 1#7  Prenatal History/Complications: H/o previous cesarean section (twins 34 weeks, cord prolapse) HS pre-eclampsia Chronic hypertension, no meds Inadequate prenatal care  Past Medical History: Past Medical History:  Diagnosis Date  . Anemia   . Anxiety   . BV (bacterial vaginosis)   . Chronic hypertension   . Headache    migraines  . Hx of chlamydia infection 10/17/2016  . Pregnancy induced hypertension    preeclampsia    Past Surgical History: Past Surgical History:  Procedure Laterality Date  . CESAREAN SECTION MULTI-GESTATIONAL N/A 05/05/2017   Procedure: CESAREAN SECTION MULTI-GESTATIONAL;  Surgeon: CMora Bellman MD;  [facility]  Service: Obstetrics;  Laterality: N/A;  . WOUND DEBRIDEMENT N/A 05/10/2017   Procedure: DEBRIDEMENT ABDOMINAL WOUND;  Surgeon: EFlorian Buff MD;  [facility];  Service: Gynecology;  Laterality: N/A;  physcian is requesting a wound vac    Obstetrical History: OB History    Gravida  3   Para  2   Term      Preterm  1   AB      Living  2     SAB      TAB      Ectopic      Multiple  1   Live Births  2           Social History: Social History   Socioeconomic History  . Marital status: Single    Spouse name: Not on file  . Number of children: Not on file  . Years of education: Not on file  . Highest education level: Not on file  Occupational History  . Not on  file  Social Needs  . Financial resource strain: Not on file  . Food insecurity    Worry: Not on file    Inability: Not on file  . Transportation needs    Medical: Not on file    Non-medical: Not on file  Tobacco Use  . Smoking status: Former SResearch scientist (life sciences) . Smokeless tobacco: Never Used  Substance and Sexual Activity  . Alcohol use: No  . Drug use: No  . Sexual activity: Not Currently    Birth control/protection: None  Lifestyle  . Physical activity    Days per week: Not on file    Minutes per session: Not on file  . Stress: Not on file  Relationships  . Social [place]: Not on file    Gets together: Not on file    Attends religious service: Not on file    Active member of club or organization: Not on file    Attends meetings of clubs or organizations: Not on file    Relationship status: Not on file  Other Topics Concern  . Not on file  Social History Narrative  . Not on file    Family History: Family History  Problem Relation Age of Onset  . COPD Mother   .  Diabetes Mother   . Mental illness Mother   . Alcohol abuse Mother   . Drug abuse Mother   . Diabetes Father   . Hypertension Father   . Alcohol abuse Father   . Drug abuse Father     Allergies: Allergies  Allergen Reactions  . Coconut Flavor Anaphylaxis  . Latex Rash    Medications Prior to Admission  Medication Sig Dispense Refill Last Dose  . albuterol (VENTOLIN HFA) 108 (90 Base) MCG/ACT inhaler Inhale 2 puffs into the lungs every 4 (four) hours as needed for wheezing or shortness of breath. (Patient not taking: Reported on 02/19/2019) 6.7 g 0   . aspirin EC 81 MG tablet Take 1 tablet (81 mg total) by mouth daily. Take after 12 weeks for prevention of preeclampsia later in pregnancy (Patient not taking: Reported on 02/19/2019) 300 tablet 2   . Blood Pressure Monitor KIT 1 kit by Does not apply route once a week. Check BP weekly.  DX: Z13.6        Z34.90 (Patient not taking:  Reported on 02/19/2019) 1 each 0   . Elastic Bandages & Supports (COMFORT FIT MATERNITY SUPP LG) MISC Use as directed. (Patient not taking: Reported on 02/19/2019) 1 each 0   . NIFEdipine (PROCARDIA-XL/NIFEDICAL-XL) 30 MG 24 hr tablet Take 1 tablet (30 mg total) by mouth daily. (Patient not taking: Reported on 02/19/2019) 30 tablet 0   . Prenatal MV-Min-FA-Omega-3 (PRENATAL GUMMIES/DHA & FA) 0.4-32.5 MG CHEW Chew by mouth.        Review of Systems   All systems reviewed and negative except as stated in HPI  Blood pressure (!) 151/87, pulse 90, temperature 98.4 F (36.9 C), temperature source Oral, resp. rate 20, height '5\' 1"'  (1.549 m), weight 104.3 kg, last menstrual period 11/21/2017, SpO2 98 %, unknown if currently breastfeeding. General appearance: alert, cooperative and no distress Lungs: regular rate and effort Heart: regular rate  Abdomen: soft, non-tender Extremities: Homans sign is negative, no sign of DVT Presentation: cephalic Fetal monitoringBaseline: 120 bpm, moderate variability, accels, no decels, Cat 1 Uterine activity irregular q2-5 minutes    Prenatal labs: ABO, Rh: AB/Positive/-- (06/10 1550) Antibody: Negative (06/10 1550) Rubella: 2.22 (06/10 1550) RPR: Non Reactive (10/01 1528)  HBsAg: Negative (06/10 1550)  HIV: Non Reactive (10/01 1528)  GBS:   unknown 1 hr GTT 109  Prenatal Transfer Tool  Maternal Diabetes: No Genetic Screening: Declined Maternal Ultrasounds/Referrals: Other: Anatomy incomplete Fetal Ultrasounds or other Referrals:  None Maternal Substance Abuse:  No Significant Maternal Medications:  None Significant Maternal Lab Results: None  Results for orders placed or performed in visit on 02/19/19 (from the past 24 hour(s))  Glucose tolerance, 1 hour   Collection Time: 02/19/19  3:28 PM  Result Value Ref Range   Glucose, 1Hr PP 109 65 - 199 mg/dL  HIV Antibody (routine testing w rflx)   Collection Time: 02/19/19  3:28 PM  Result Value Ref  Range   HIV Screen 4th Generation wRfx Non Reactive Non Reactive  RPR   Collection Time: 02/19/19  3:28 PM  Result Value Ref Range   RPR Ser Ql Non Reactive Non Reactive  CBC   Collection Time: 02/19/19  3:28 PM  Result Value Ref Range   WBC 8.6 3.4 - 10.8 x10E3/uL   RBC 3.83 3.77 - 5.28 x10E6/uL   Hemoglobin 10.6 (L) 11.1 - 15.9 g/dL   Hematocrit 32.1 (L) 34.0 - 46.6 %   MCV 84 79 - 97 fL  MCH 27.7 26.6 - 33.0 pg   MCHC 33.0 31.5 - 35.7 g/dL   RDW 13.9 11.7 - 15.4 %   Platelets 209 150 - 450 x10E3/uL    Patient Active Problem List   Diagnosis Date Noted  . Limited prenatal care in third trimester 02/19/2019  . Supervision of high risk pregnancy, antepartum 10/29/2018  . History of cesarean section 10/29/2018  . Chronic hypertension 10/23/2018    Assessment: Jackie Chavez is a 23 y.o. G3P0102 at 82w3dhere for SROM at 1000 with onset of contractions. She is a TOLAC.  1. Labor: Expectant management, may consider low-dose pitocin augmentation if labor stalls out. 2. FWB: Cat 1 tracing, continue to monitor 3. Pain: IV pain meds, epidural as desired 4. GBS: unknown, culture sent yesterday. Will treat with PCN. 5. Birth control: desires BTL, will have to do outpatient/post partum as consent has not been signed.   Plan: TOLAC and repeat Cesarean section consents signed in MAU.  Trial of labor after cesarean section (TOLAC) versus elective repeat cesarean delivery (ERCD) were discussed. The following risks were discussed with the patient.  Risk of uterine rupture at term is 0.78 percent with TOLAC and 0.22 percent with ERCD. 1 in 10 uterine ruptures will result in neonatal death or neurological injury. The benefits of a trial of labor after cesarean (TOLAC) resulting in a vaginal birth after cesarean (VBAC) include the following: shorter length of hospital stay and postpartum recovery (in most cases); fewer complications, such as postpartum fever, wound or uterine infection,  thromboembolism (blood clots in the leg or lung), need for blood transfusion and fewer neonatal breathing problems. The risks of an attempted VBAC or TOLAC include the following: . Risk of failed trial of labor after cesarean (TOLAC) without a vaginal birth after cesarean (VBAC) resulting in repeat cesarean delivery (RCD) in about 20 to 431percent of women who attempt VBAC.  .Marland KitchenRisk of rupture of uterus resulting in an emergency cesarean delivery. The risk of uterine rupture may be related in part to the type of uterine incision made during the first cesarean delivery. A previous transverse uterine incision has the lowest risk of rupture (0.2 to 1.5 percent risk). Vertical or T-shaped uterine incisions have a higher risk of uterine rupture (4 to 9 percent risk)The risk of fetal death is very low with both VBAC and elective repeat cesarean delivery (ERCD), but the likelihood of fetal death is higher with VBAC than with ERCD. Maternal death is very rare with either type of delivery. The risks of an elective repeat cesarean delivery (ERCD) were reviewed with the patient including but not limited to: 06/998 risk of uterine rupture which could have serious consequences, bleeding which may require transfusion; infection which may require antibiotics; injury to bowel, bladder or other surrounding organs (bowel, bladder, ureters); injury to the fetus; need for additional procedures including hysterectomy in the event of a life-threatening hemorrhage; thromboembolic phenomenon; abnormal placentation; incisional problems; death and other postoperative or anesthesia complications.    These risks and benefits are summarized on the consent form, which was reviewed with the patient during the visit.  All her questions answered and she signed a consent indicating a preference for TOLAC/ERCD. A copy of the consent was given to the patient.  Consent for repeat Cesarean section was also signed and witnessed.  Anticipate  VBAC, CS as appropriate or if emergently indicated.  [place] DO  02/20/2019, 11:51 AM

## 2019-02-21 ENCOUNTER — Inpatient Hospital Stay (HOSPITAL_COMMUNITY): Payer: Medicaid Other | Admitting: Anesthesiology

## 2019-02-21 ENCOUNTER — Other Ambulatory Visit (HOSPITAL_COMMUNITY): Payer: Medicaid Other

## 2019-02-21 DIAGNOSIS — Z3A39 39 weeks gestation of pregnancy: Secondary | ICD-10-CM

## 2019-02-21 LAB — CBC
HCT: 34.1 % — ABNORMAL LOW (ref 36.0–46.0)
Hemoglobin: 10.8 g/dL — ABNORMAL LOW (ref 12.0–15.0)
MCH: 27.4 pg (ref 26.0–34.0)
MCHC: 31.7 g/dL (ref 30.0–36.0)
MCV: 86.5 fL (ref 80.0–100.0)
Platelets: 224 10*3/uL (ref 150–400)
RBC: 3.94 MIL/uL (ref 3.87–5.11)
RDW: 14.6 % (ref 11.5–15.5)
WBC: 8.7 10*3/uL (ref 4.0–10.5)
nRBC: 0 % (ref 0.0–0.2)

## 2019-02-21 LAB — ABO/RH: ABO/RH(D): AB POS

## 2019-02-21 LAB — RPR: RPR Ser Ql: NONREACTIVE

## 2019-02-21 LAB — STREP GP B NAA: Strep Gp B NAA: POSITIVE — AB

## 2019-02-21 MED ORDER — FENTANYL-BUPIVACAINE-NACL 0.5-0.125-0.9 MG/250ML-% EP SOLN
12.0000 mL/h | EPIDURAL | Status: DC | PRN
  Filled 2019-02-21: qty 250

## 2019-02-21 MED ORDER — FAMOTIDINE IN NACL 20-0.9 MG/50ML-% IV SOLN
20.0000 mg | Freq: Once | INTRAVENOUS | Status: AC
  Administered 2019-02-21: 11:00:00 20 mg via INTRAVENOUS
  Filled 2019-02-21: qty 50

## 2019-02-21 MED ORDER — LIDOCAINE HCL (PF) 1 % IJ SOLN
INTRAMUSCULAR | Status: DC | PRN
  Administered 2019-02-21: 5 mL via EPIDURAL
  Administered 2019-02-21: 7 mL via EPIDURAL

## 2019-02-21 MED ORDER — EPHEDRINE 5 MG/ML INJ: 10.0000 mg | INTRAVENOUS | Status: DC | PRN

## 2019-02-21 MED ORDER — ZOLPIDEM TARTRATE 5 MG PO TABS: 5.0000 mg | ORAL_TABLET | Freq: Every evening | ORAL | Status: DC | PRN

## 2019-02-21 MED ORDER — SODIUM CHLORIDE (PF) 0.9 % IJ SOLN
INTRAMUSCULAR | Status: DC | PRN
  Administered 2019-02-21: 12 mL/h via EPIDURAL

## 2019-02-21 MED ORDER — PHENYLEPHRINE 40 MCG/ML (10ML) SYRINGE FOR IV PUSH (FOR BLOOD PRESSURE SUPPORT): 80.0000 ug | PREFILLED_SYRINGE | INTRAVENOUS | Status: DC | PRN

## 2019-02-21 MED ORDER — SIMETHICONE 80 MG PO CHEW: 80.0000 mg | CHEWABLE_TABLET | ORAL | Status: DC | PRN

## 2019-02-21 MED ORDER — SENNOSIDES-DOCUSATE SODIUM 8.6-50 MG PO TABS
2.0000 | ORAL_TABLET | ORAL | Status: DC
  Administered 2019-02-21: 23:00:00 2 via ORAL
  Filled 2019-02-21: qty 2

## 2019-02-21 MED ORDER — ACETAMINOPHEN 325 MG PO TABS
650.0000 mg | ORAL_TABLET | ORAL | Status: DC | PRN
  Administered 2019-02-21 – 2019-02-22 (×2): 650 mg via ORAL
  Filled 2019-02-21 (×2): qty 2

## 2019-02-21 MED ORDER — OXYTOCIN 40 UNITS IN NORMAL SALINE INFUSION - SIMPLE MED: 1.0000 m[IU]/min | INTRAVENOUS | Status: DC

## 2019-02-21 MED ORDER — TETANUS-DIPHTH-ACELL PERTUSSIS 5-2.5-18.5 LF-MCG/0.5 IM SUSP: 0.5000 mL | Freq: Once | INTRAMUSCULAR | Status: DC

## 2019-02-21 MED ORDER — ONDANSETRON HCL 4 MG/2ML IJ SOLN: 4.0000 mg | INTRAMUSCULAR | Status: DC | PRN

## 2019-02-21 MED ORDER — IBUPROFEN 600 MG PO TABS
600.0000 mg | ORAL_TABLET | Freq: Four times a day (QID) | ORAL | Status: DC
  Administered 2019-02-21 – 2019-02-22 (×5): 600 mg via ORAL
  Filled 2019-02-21 (×5): qty 1

## 2019-02-21 MED ORDER — LACTATED RINGERS AMNIOINFUSION
INTRAVENOUS | Status: DC
  Administered 2019-02-21 (×2): via INTRAUTERINE

## 2019-02-21 MED ORDER — DIPHENHYDRAMINE HCL 50 MG/ML IJ SOLN: 12.5000 mg | INTRAMUSCULAR | Status: DC | PRN

## 2019-02-21 MED ORDER — COCONUT OIL OIL: 1.0000 "application " | TOPICAL_OIL | Status: DC | PRN

## 2019-02-21 MED ORDER — DIPHENHYDRAMINE HCL 25 MG PO CAPS: 25.0000 mg | ORAL_CAPSULE | Freq: Four times a day (QID) | ORAL | Status: DC | PRN

## 2019-02-21 MED ORDER — LACTATED RINGERS IV SOLN
500.0000 mL | Freq: Once | INTRAVENOUS | Status: AC
  Administered 2019-02-21: 02:00:00 500 mL via INTRAVENOUS

## 2019-02-21 MED ORDER — BENZOCAINE-MENTHOL 20-0.5 % EX AERO
1.0000 "application " | INHALATION_SPRAY | CUTANEOUS | Status: DC | PRN
  Administered 2019-02-21: 1 via TOPICAL
  Filled 2019-02-21: qty 56

## 2019-02-21 MED ORDER — PRENATAL MULTIVITAMIN CH
1.0000 | ORAL_TABLET | Freq: Every day | ORAL | Status: DC
  Administered 2019-02-22: 14:00:00 1 via ORAL
  Filled 2019-02-21: qty 1

## 2019-02-21 MED ORDER — WITCH HAZEL-GLYCERIN EX PADS: 1.0000 "application " | MEDICATED_PAD | CUTANEOUS | Status: DC | PRN

## 2019-02-21 MED ORDER — DIBUCAINE (PERIANAL) 1 % EX OINT: 1.0000 "application " | TOPICAL_OINTMENT | CUTANEOUS | Status: DC | PRN

## 2019-02-21 MED ORDER — ONDANSETRON HCL 4 MG PO TABS: 4.0000 mg | ORAL_TABLET | ORAL | Status: DC | PRN

## 2019-02-21 NOTE — Anesthesia Procedure Notes (Signed)
Epidural Patient location during procedure: OB Start time: 02/21/2019 2:30 AM End time: 02/21/2019 2:34 AM  Staffing Anesthesiologist: Lyn Hollingshead, MD Performed: anesthesiologist   Preanesthetic Checklist Completed: patient identified, site marked, surgical consent, pre-op evaluation, timeout performed, IV checked, risks and benefits discussed and monitors and equipment checked  Epidural Patient position: sitting Prep: site prepped and draped and DuraPrep Patient monitoring: continuous pulse ox and blood pressure Approach: midline Location: L3-L4 Injection technique: LOR air  Needle:  Needle type: Tuohy  Needle gauge: 17 G Needle length: 9 cm and 9 Needle insertion depth: 6 cm Catheter type: closed end flexible Catheter size: 19 Gauge Catheter at skin depth: 11 cm Test dose: negative and Other  Assessment Events: blood not aspirated, injection not painful, no injection resistance, negative IV test and no paresthesia  Additional Notes Reason for block:procedure for pain

## 2019-02-21 NOTE — Progress Notes (Signed)
Labor Progress Note Jackie Chavez is a 23 y.o. G3P0102 at [redacted]w[redacted]d presented for SROM  S:  Patient comfortable.   O:  BP 91/62   Pulse (!) 149   Temp 98.7 F (37.1 C) (Oral)   Resp 16   Ht 5\' 1"  (1.549 m)   Wt 104.8 kg   LMP 11/21/2017   SpO2 99%   BMI 43.65 kg/m   Fetal Tracing:  Baseline: 120 Variability: moderate Accels: 15x15 Decels: none  Toco: 2-5   CVE: Dilation: 5 Effacement (%): 80 Cervical Position: Posterior Station: -1 Presentation: Vertex Exam by:: Eckstat MD   [name]: 23 y.o. G3P0102 [redacted]w[redacted]d SROM #Labor: Will restart pitocin since FHR reassuring.  #Pain: epidural #FWB: Cat 1 #GBS unknown PCN  [name], CNM 9:07 AM

## 2019-02-21 NOTE — Discharge Summary (Signed)
  Postpartum Discharge Summary  Patient Name: Jackie Chavez DOB: 03/27/1996 MRN: [id number]  Date of admission: 02/20/2019 Delivering Provider: NEILL, CAROLINE M   Date of discharge: 02/22/2019  Admitting diagnosis: 39wks leaking fluid Intrauterine pregnancy: [redacted]w[redacted]d     Secondary diagnosis:  Active Problems:   Normal labor  Additional problems: Hx c/s     Discharge diagnosis: Term Pregnancy Delivered and VBAC                                                                                                Post partum procedures:n/a  Augmentation: Pitocin and Foley Balloon  Complications: None  Hospital course:  Onset of Labor With Vaginal Delivery     23 y.o. yo G3P0102 at [redacted]w[redacted]d was admitted in Latent Labor on 02/20/2019. Patient had an uncomplicated labor course as follows: PPROM and had FB placed. Started on pitocin after FB out and delivered after 20 minutes pushing.  Membrane Rupture Time/Date: 10:00 AM ,02/20/2019   Intrapartum Procedures: Episiotomy: None [1]                                         Lacerations:  2nd degree [3];Perineal [11]  Patient had a delivery of a Viable infant. 02/21/2019  Information for the patient's newborn:  Mccall, Girl Julina [[id number]]  Delivery Method: VBAC, Spontaneous(Filed from Delivery Summary)     Pateint had an uncomplicated postpartum course.  She is ambulating, tolerating a regular diet, passing flatus, and urinating well. Patient is discharged home in stable condition on 02/22/19.  Delivery time: 2:40 PM    Magnesium Sulfate received: No BMZ received: No Rhophylac:No MMR:No Transfusion:No  Physical exam  Vitals:   02/21/19 2134 02/22/19 0228 02/22/19 0531 02/22/19 1551  BP:  135/72 129/69 133/82  Pulse: 97 86 80 86  Resp: 18 18 18 19  Temp: 97.6 F (36.4 C) 97.8 F (36.6 C) 98 F (36.7 C) 98.5 F (36.9 C)  TempSrc: Oral Oral Oral Oral  SpO2:    100%  Weight:      Height:       General: alert, cooperative and no  distress Lochia: appropriate Uterine Fundus: firm Incision: Dressing is clean, dry, and intact DVT Evaluation: No evidence of DVT seen on physical exam. Labs: Lab Results  Component Value Date   WBC 10.1 02/22/2019   HGB 8.2 (L) 02/22/2019   HCT 25.0 (L) 02/22/2019   MCV 86.8 02/22/2019   PLT 175 02/22/2019   CMP Latest Ref Rng & Units 02/20/2019  Glucose 70 - 99 mg/dL 82  BUN 6 - 20 mg/dL 7  Creatinine 0.44 - 1.00 mg/dL 0.56  Sodium 135 - 145 mmol/L 136  Potassium 3.5 - 5.1 mmol/L 3.8  Chloride 98 - 111 mmol/L 106  CO2 22 - 32 mmol/L 23  Calcium 8.9 - 10.3 mg/dL 8.5(L)  Total Protein 6.5 - 8.1 g/dL 5.7(L)  Total Bilirubin 0.3 - 1.2 mg/dL 0.7  Alkaline Phos 38 - 126 U/L 213(H)  AST 15 -   41 U/L 12(L)  ALT 0 - 44 U/L 11    Discharge instruction: per After Visit Summary and "Baby and Me Booklet".  After visit meds:  Allergies as of 02/22/2019      Reactions   Coconut Flavor Anaphylaxis   Latex Swelling, Rash   Had swelling from foley cath and irritation       Medication List    TAKE these medications   albuterol 108 (90 Base) MCG/ACT inhaler Commonly known as: VENTOLIN HFA Inhale 2 puffs into the lungs every 4 (four) hours as needed for wheezing or shortness of breath.   aspirin EC 81 MG tablet Take 1 tablet (81 mg total) by mouth daily. Take after 12 weeks for prevention of preeclampsia later in pregnancy   Blood Pressure Monitor Kit 1 kit by Does not apply route once a week. Check BP weekly.  DX: Z13.6        Z34.90   Comfort Fit Maternity Supp Lg Misc Use as directed.   ferrous sulfate 325 (65 FE) MG tablet Take 1 tablet (325 mg total) by mouth daily with breakfast. Start taking on: February 23, 2019   ibuprofen 600 MG tablet Commonly known as: ADVIL Take 1 tablet (600 mg total) by mouth every 6 (six) hours.   NIFEdipine 30 MG 24 hr tablet Commonly known as: PROCARDIA-XL/NIFEDICAL-XL Take 1 tablet (30 mg total) by mouth daily.   Prenatal Gummies/DHA  & FA 0.4-32.5 MG Chew Chew by mouth.   senna-docusate 8.6-50 MG tablet Commonly known as: Senokot-S Take 2 tablets by mouth daily. Start taking on: February 23, 2019       Diet: routine diet  Activity: Advance as tolerated. Pelvic rest for 6 weeks.   Outpatient follow up:2 weeks for BP check Follow up Appt:No future appointments. Follow up Visit: [place]. Schedule an appointment as soon as possible for a visit in 4 week(s).   Specialty: Obstetrics and Gynecology Why: Postpartum follow up  Contact information: [address], [place] [phone]         Please schedule this patient for Postpartum visit in: 2 weeks for BP check. 4 weeks for postpartum visit with Any provider For C/S patients schedule nurse incision check in weeks 2 weeks: no Low risk pregnancy complicated by: hx c/s Delivery mode:  SVD Anticipated Birth Control:  Plans Interval BTL PP Procedures needed: n/a  Schedule Integrated [place] visit: no   Newborn Data: Live born female  Birth Weight:  3660g APGAR: 25, 9  Newborn Delivery   Birth date/time: 02/21/2019 14:40:00 Delivery type: VBAC, Spontaneous      Baby Feeding: Bottle and Breast Disposition:home with mother

## 2019-02-21 NOTE — Progress Notes (Signed)
LABOR PROGRESS NOTE  Jackie Chavez is a 23 y.o. G3P0102 at [redacted]w[redacted]d admitted for SROM..  Subjective: Sleeping comfortably since epidural  Objective: BP (!) 117/59   Pulse 100   Temp 98.7 F (37.1 C) (Oral)   Resp 16   Ht 5\' 1"  (1.549 m)   Wt 104.8 kg   LMP 11/21/2017   SpO2 99%   BMI 43.65 kg/m  or  Vitals:   02/21/19 0255 02/21/19 0300 02/21/19 0305 02/21/19 0310  BP: 120/62 129/69 (!) 115/47 (!) 117/59  Pulse: 82 83 97 100  Resp: 16 16 16 16   Temp:      TempSrc:      SpO2: 96% 96% 96% 99%  Weight:      Height:         Dilation: 4 Effacement (%): 50 Cervical Position: Posterior Station: -3 Presentation: Vertex Exam by:: B [employer] RN  FHT: baseline rate 120, moderate varibility, +acel, -decel Toco: q1-3 min  Labs: Lab Results  Component Value Date   WBC 8.7 02/21/2019   HGB 10.8 (L) 02/21/2019   HCT 34.1 (L) 02/21/2019   MCV 86.5 02/21/2019   PLT 224 02/21/2019    Patient Active Problem List   Diagnosis Date Noted  . Normal labor 02/20/2019  . Limited prenatal care in third trimester 02/19/2019  . Supervision of high risk pregnancy, antepartum 10/29/2018  . History of cesarean section 10/29/2018  . Chronic hypertension 10/23/2018    Assessment / Plan: 23 y.o. G3P0102 at [redacted]w[redacted]d here for SROM.  Labor: SROM at 1000 on 10/2, s/p FB, unchanged exam, cont to uptitrate pitocin currently at 8, consider IUPC at next check.   Fetal Wellbeing:  Cat I Pain Control:  epidural Anticipated MOD:  SVD TOLAC: prior CS for cord prolapse GBS unknown: on PCN CHTN: previously on labetalol but says she stopped 2 months ago, intermittent mild range BP's, PIH labs normal, ctm.   Augustin Coupe, MD/MPH OB Fellow  02/21/2019, 4:27 AM

## 2019-02-21 NOTE — Progress Notes (Signed)
Labor Progress Note Jackie Chavez is a 22 y.o. G3P0102 at [redacted]w[redacted]d presented for SROM  S:  Patient feeling more contractions now  O:  BP 136/64   Pulse (!) 105   Temp 98.5 F (36.9 C) (Oral)   Resp 20   Ht 5\' 1"  (1.549 m)   Wt 104.8 kg   LMP 11/21/2017   SpO2 99%   BMI 43.65 kg/m   Fetal Tracing:   Baseline: 125 Variability: moderate Accels: 15x15 Decels: none  Toco: 2-3   CVE: Dilation: 6 Effacement (%): 90 Cervical Position: Posterior Station: -1 Presentation: Vertex Exam by:: Haynes Bast CNM   [name]: 23 y.o. G3P0102 [redacted]w[redacted]d SROM #Labor: Progressing well. Continue pitocin #Pain: epidural #FWB: Cat 1 #GBS unknown, PCN  [name], CNM 11:50 AM

## 2019-02-21 NOTE — Anesthesia Preprocedure Evaluation (Signed)
Anesthesia Evaluation  Patient identified by MRN, date of birth, ID band Patient awake    Reviewed: Allergy & Precautions, NPO status , Patient's Chart, lab work & pertinent test results  Airway Mallampati: III  TM Distance: >3 FB Neck ROM: Full    Dental no notable dental hx. (+) Teeth Intact   Pulmonary former smoker,    Pulmonary exam normal breath sounds clear to auscultation       Cardiovascular hypertension, negative cardio ROS Normal cardiovascular exam Rhythm:Regular Rate:Normal     Neuro/Psych PSYCHIATRIC DISORDERS Anxiety    GI/Hepatic negative GI ROS, Neg liver ROS,   Endo/Other  Morbid obesity  Renal/GU negative Renal ROS  negative genitourinary   Musculoskeletal negative musculoskeletal ROS (+)   Abdominal (+) + obese,   Peds negative pediatric ROS (+)  Hematology  (+) Blood dyscrasia, anemia ,   Anesthesia Other Findings   Reproductive/Obstetrics negative OB ROS (+) Pregnancy                             Anesthesia Physical  Anesthesia Plan  ASA: III  Anesthesia Plan: Epidural   Post-op Pain Management:    Induction:   PONV Risk Score and Plan:   Airway Management Planned:   Additional Equipment:   Intra-op Plan:   Post-operative Plan:   Informed Consent: I have reviewed the patients History and Physical, chart, labs and discussed the procedure including the risks, benefits and alternatives for the proposed anesthesia with the patient or authorized representative who has indicated his/her understanding and acceptance.       Plan Discussed with:   Anesthesia Plan Comments:         Anesthesia Quick Evaluation

## 2019-02-21 NOTE — Progress Notes (Signed)
Since about 0720 pt appears restless continuously.  Attempting to reposition feet and legs until the point that legs are dangling legs off of the bed.  Requested that pt attempt to not move so frequently that it is causing her legs to dangle but pt states she needs "to get comfortable"

## 2019-02-22 ENCOUNTER — Inpatient Hospital Stay (HOSPITAL_COMMUNITY): Payer: Medicaid Other

## 2019-02-22 ENCOUNTER — Inpatient Hospital Stay (HOSPITAL_COMMUNITY)
Admission: AD | Admit: 2019-02-22 | Payer: Medicaid Other | Source: Home / Self Care | Admitting: Obstetrics and Gynecology

## 2019-02-22 LAB — CBC
HCT: 25 % — ABNORMAL LOW (ref 36.0–46.0)
Hemoglobin: 8.2 g/dL — ABNORMAL LOW (ref 12.0–15.0)
MCH: 28.5 pg (ref 26.0–34.0)
MCHC: 32.8 g/dL (ref 30.0–36.0)
MCV: 86.8 fL (ref 80.0–100.0)
Platelets: 175 10*3/uL (ref 150–400)
RBC: 2.88 MIL/uL — ABNORMAL LOW (ref 3.87–5.11)
RDW: 14.9 % (ref 11.5–15.5)
WBC: 10.1 10*3/uL (ref 4.0–10.5)
nRBC: 0 % (ref 0.0–0.2)

## 2019-02-22 MED ORDER — IBUPROFEN 600 MG PO TABS: 600.0000 mg | ORAL_TABLET | Freq: Four times a day (QID) | ORAL | 0 refills | Status: DC

## 2019-02-22 MED ORDER — FERROUS SULFATE 325 (65 FE) MG PO TABS: 325.0000 mg | ORAL_TABLET | Freq: Every day | ORAL | 3 refills | Status: DC

## 2019-02-22 MED ORDER — SENNOSIDES-DOCUSATE SODIUM 8.6-50 MG PO TABS: 2.0000 | ORAL_TABLET | ORAL | 0 refills | Status: DC

## 2019-02-22 MED ORDER — FERROUS SULFATE 325 (65 FE) MG PO TABS
325.0000 mg | ORAL_TABLET | Freq: Every day | ORAL | Status: DC
  Administered 2019-02-22: 10:00:00 325 mg via ORAL
  Filled 2019-02-22: qty 1

## 2019-02-22 NOTE — Progress Notes (Signed)
MOB was referred for history of depression/anxiety. * Referral screened out by Clinical Social Worker because none of the following criteria appear to apply: ~ History of anxiety/depression during this pregnancy, or of post-partum depression following prior delivery. ~ Diagnosis of anxiety and/or depression within last 3 years OR * MOB's symptoms currently being treated with medication and/or therapy.  Please contact the Clinical Social Worker if needs arise, by MOB request, or if MOB scores greater than 9/yes to question 10 on Edinburgh Postpartum Depression Screen.  MOB does not meet the criteria for late/limited PNC clinical assessment. CSW will not monitor infant's UDS or CDS.   Jackie Chavez, MSW, LCSW Clinical Social Work [phone] 

## 2019-02-22 NOTE — Progress Notes (Signed)
Dr. Juleen China called regarding pt d/c paperwork.  Pt was not taking Procardia during delivery.  She does not remember the last time she took the medication.    Dr.  Juleen China stated that if pt was not taking Procardia, okay to not start it postparutm, at d/c.  Pt should continue 81mg  Aspirin for 6 weeks after delivery to decrease chance of developing Pre Eclampsia once d/c to home.  Pt agrees to take Aspirin and verbalized understanding.    Pt and infant d/c home in stable condition.

## 2019-02-22 NOTE — Progress Notes (Addendum)
The Rn educated mom about signs of hypertension and encouraged mom to discuss with doctor possible medications, since mom was  on medications at one time and mom stopped taking the medications for hypertension.

## 2019-02-22 NOTE — Discharge Instructions (Signed)

## 2019-02-22 NOTE — Progress Notes (Addendum)
POSTPARTUM PROGRESS NOTE  Post Partum Day #1  Subjective:  Jackie Chavez is a 23 y.o. Q6P6195 s/p NSVD at [redacted]w[redacted]d.  She reports she is doing well. No acute events overnight. She denies any problems with ambulating, voiding or po intake. Denies nausea or vomiting.  Pain is moderately controlled.  Lochia is moderate.  Objective: Blood pressure 129/69, pulse 80, temperature 98 F (36.7 C), temperature source Oral, resp. rate 18, height 5\' 1"  (1.549 m), weight 104.8 kg, last menstrual period 11/21/2017, SpO2 100 %, unknown if currently breastfeeding.  Physical Exam:  General: alert, cooperative and no distress Chest: no respiratory distress Heart:regular rate, distal pulses intact Abdomen: soft, nontender,  Uterine Fundus: firm, appropriately tender DVT Evaluation: No calf swelling or tenderness Extremities: No LE edema Skin: warm, dry  Recent Labs    02/20/19 1112 02/21/19 0141  HGB 10.5* 10.8*  HCT 32.0* 34.1*    Assessment/Plan: Jackie Chavez is a 23 y.o. [id number] s/p NSVD at [redacted]w[redacted]d   PPD#1 - Doing well  Routine postpartum care H/o CHTN: Blood pressures normotensive this AM. She had few elevated BP's overnight. Continue to monitor. Recommend blood pressure check in 2 weeks. Consider starting anti-HTN if remains elevated. Contraception: outpatient BTL Feeding: Both Dispo: Plan for discharge 10/4 or 10/5. Patient requests discharge today if possible.   LOS: 2 days   Jackie Chavez, D.O. [place] Resident, PGY2 02/22/2019, 5:47 AM  OB FELLOW ATTESTATION  I have seen and examined this patient and agree with above documentation in the resident's note except as noted below.  Start iron cHTN not on meds for past two months, intermittent mild range BP's postpartum, message sent to clinic for 7-10d BP check  Jackie Coupe, MD/MPH OB Fellow  02/22/2019, 8:01 AM

## 2019-02-22 NOTE — Anesthesia Postprocedure Evaluation (Signed)
Anesthesia Post Note  Patient: Jackie Chavez  Procedure(s) Performed: AN AD [place]     Patient location during evaluation: Mother Baby Anesthesia Type: Epidural Level of consciousness: awake Pain management: satisfactory to patient Vital Signs Assessment: post-procedure vital signs reviewed and stable Respiratory status: spontaneous breathing Cardiovascular status: stable Anesthetic complications: no    Last Vitals:  Vitals:   02/22/19 0531 02/22/19 1551  BP: 129/69 133/82  Pulse: 80 86  Resp: 18 19  Temp: 36.7 C 36.9 C  SpO2:  100%    Last Pain:  Vitals:   02/22/19 1551  TempSrc: Oral  PainSc:    Pain Goal:                   [employer]

## 2019-02-22 NOTE — Plan of Care (Signed)
  Problem: Education: Goal: Knowledge of General Education information will improve Description: Including pain rating scale, medication(s)/side effects and non-pharmacologic comfort measures Outcome: Completed/Met   Problem: Education: Goal: Knowledge of condition will improve Outcome: Completed/Met   Problem: Activity: Goal: Will verbalize the importance of balancing activity with adequate rest periods Outcome: Completed/Met Goal: Ability to tolerate increased activity will improve Outcome: Completed/Met   Problem: Life Cycle: Goal: Chance of risk for complications during the postpartum period will decrease Outcome: Completed/Met   Problem: Role Relationship: Goal: Ability to demonstrate positive interaction with newborn will improve Outcome: Completed/Met   Problem: Skin Integrity: Goal: Demonstration of wound healing without infection will improve Outcome: Completed/Met   

## 2019-02-23 ENCOUNTER — Encounter (HOSPITAL_COMMUNITY): Payer: Self-pay | Admitting: *Deleted

## 2019-02-26 DIAGNOSIS — Z5181 Encounter for therapeutic drug level monitoring: Secondary | ICD-10-CM | POA: Diagnosis not present

## 2019-03-04 DIAGNOSIS — Z5181 Encounter for therapeutic drug level monitoring: Secondary | ICD-10-CM | POA: Diagnosis not present

## 2019-03-12 DIAGNOSIS — Z5181 Encounter for therapeutic drug level monitoring: Secondary | ICD-10-CM | POA: Diagnosis not present

## 2019-03-19 DIAGNOSIS — Z5181 Encounter for therapeutic drug level monitoring: Secondary | ICD-10-CM | POA: Diagnosis not present

## 2019-03-23 ENCOUNTER — Other Ambulatory Visit: Payer: Self-pay

## 2019-03-23 ENCOUNTER — Ambulatory Visit (INDEPENDENT_AMBULATORY_CARE_PROVIDER_SITE_OTHER): Payer: Medicaid Other | Admitting: Obstetrics and Gynecology

## 2019-03-23 ENCOUNTER — Encounter: Payer: Self-pay | Admitting: Obstetrics and Gynecology

## 2019-03-23 DIAGNOSIS — Z1389 Encounter for screening for other disorder: Secondary | ICD-10-CM | POA: Diagnosis not present

## 2019-03-23 MED ORDER — NORELGESTROMIN-ETH ESTRADIOL 150-35 MCG/24HR TD PTWK: 1.0000 | MEDICATED_PATCH | TRANSDERMAL | 12 refills | Status: DC

## 2019-03-23 NOTE — Progress Notes (Signed)
Post Partum Exam  Jackie Chavez is a 23 y.o. [phone] female who presents for a postpartum visit. She is 4 weeks postpartum following a spontaneous vaginal delivery. I have fully reviewed the prenatal and intrapartum course. The delivery was at 76 gestational weeks.  Anesthesia: epidural. Postpartum course has been uncomplicated. Baby's course has been uncomplicated. Baby is feeding by bottle - [name]. Bleeding no bleeding. Bowel function is normal. Bladder function is normal. Patient is not sexually active. Contraception method is none. Postpartum depression screening:neg  Last pap smear done 10/2018 and was Normal  Review of Systems Pertinent items are noted in HPI.    Objective:  Blood pressure 119/79, pulse 73, height 5\' 1"  (1.549 m), weight 204 lb 12.8 oz (92.9 kg), last menstrual period 11/21/2017, not currently breastfeeding.  General:  alert, cooperative and no distress   Breasts:  inspection negative, no nipple discharge or bleeding, no masses or nodularity palpable  Lungs: clear to auscultation bilaterally  Heart:  regular rate and rhythm  Abdomen: soft, non-tender; bowel sounds normal; no masses,  no organomegaly   Vulva:  normal  Vagina: normal vagina, no discharge, exudate, lesion, or erythema and second degree laceration is healing well  Cervix:  multiparous appearance  Corpus: normal size, contour, position, consistency, mobility, non-tender  Adnexa:  normal adnexa and no mass, fullness, tenderness  Rectal Exam: Not performed.        Assessment:    Normal postpartum exam. Pap smear not done at today's visit.   Plan:   1. Contraception: patch 2. Patient is medically cleared to resume all activities of daily living. Patient is normotensive without medication, reporting issues with elevated BP only during pregnancies 3. Follow up in: 8 months or as needed.

## 2019-03-25 DIAGNOSIS — Z5181 Encounter for therapeutic drug level monitoring: Secondary | ICD-10-CM | POA: Diagnosis not present

## 2019-03-27 NOTE — Progress Notes (Signed)
This encounter was created in error - please disregard.

## 2019-03-31 DIAGNOSIS — Z5181 Encounter for therapeutic drug level monitoring: Secondary | ICD-10-CM | POA: Diagnosis not present

## 2019-04-08 DIAGNOSIS — Z5181 Encounter for therapeutic drug level monitoring: Secondary | ICD-10-CM | POA: Diagnosis not present

## 2019-04-20 DIAGNOSIS — Z5181 Encounter for therapeutic drug level monitoring: Secondary | ICD-10-CM | POA: Diagnosis not present

## 2019-04-27 DIAGNOSIS — Z5181 Encounter for therapeutic drug level monitoring: Secondary | ICD-10-CM | POA: Diagnosis not present

## 2019-05-04 DIAGNOSIS — Z5181 Encounter for therapeutic drug level monitoring: Secondary | ICD-10-CM | POA: Diagnosis not present

## 2019-05-11 DIAGNOSIS — Z5181 Encounter for therapeutic drug level monitoring: Secondary | ICD-10-CM | POA: Diagnosis not present

## 2019-05-18 DIAGNOSIS — Z5181 Encounter for therapeutic drug level monitoring: Secondary | ICD-10-CM | POA: Diagnosis not present

## 2019-06-01 DIAGNOSIS — Z5181 Encounter for therapeutic drug level monitoring: Secondary | ICD-10-CM | POA: Diagnosis not present

## 2019-06-08 DIAGNOSIS — Z5181 Encounter for therapeutic drug level monitoring: Secondary | ICD-10-CM | POA: Diagnosis not present

## 2019-06-15 DIAGNOSIS — Z5181 Encounter for therapeutic drug level monitoring: Secondary | ICD-10-CM | POA: Diagnosis not present

## 2019-06-22 DIAGNOSIS — Z5181 Encounter for therapeutic drug level monitoring: Secondary | ICD-10-CM | POA: Diagnosis not present

## 2019-07-17 ENCOUNTER — Other Ambulatory Visit: Payer: Self-pay

## 2019-07-17 ENCOUNTER — Encounter (HOSPITAL_COMMUNITY): Payer: Self-pay | Admitting: Emergency Medicine

## 2019-07-17 ENCOUNTER — Emergency Department (HOSPITAL_COMMUNITY): Payer: Medicaid Other

## 2019-07-17 ENCOUNTER — Emergency Department (HOSPITAL_COMMUNITY)
Admission: EM | Admit: 2019-07-17 | Discharge: 2019-07-18 | Disposition: A | Discharge status: Home / Self Care | Payer: Medicaid Other | Attending: Emergency Medicine | Admitting: Emergency Medicine

## 2019-07-17 DIAGNOSIS — F17210 Nicotine dependence, cigarettes, uncomplicated: Secondary | ICD-10-CM | POA: Diagnosis not present

## 2019-07-17 DIAGNOSIS — R2231 Localized swelling, mass and lump, right upper limb: Secondary | ICD-10-CM | POA: Diagnosis not present

## 2019-07-17 DIAGNOSIS — Z79899 Other long term (current) drug therapy: Secondary | ICD-10-CM | POA: Diagnosis not present

## 2019-07-17 DIAGNOSIS — L03011 Cellulitis of right finger: Secondary | ICD-10-CM | POA: Insufficient documentation

## 2019-07-17 DIAGNOSIS — M79644 Pain in right finger(s): Secondary | ICD-10-CM | POA: Diagnosis not present

## 2019-07-17 DIAGNOSIS — Z91040 Latex allergy status: Secondary | ICD-10-CM | POA: Insufficient documentation

## 2019-07-17 DIAGNOSIS — M7989 Other specified soft tissue disorders: Secondary | ICD-10-CM | POA: Diagnosis not present

## 2019-07-17 MED ORDER — LIDOCAINE HCL 2 % IJ SOLN
10.0000 mL | Freq: Once | INTRAMUSCULAR | Status: AC
  Administered 2019-07-18: 200 mg
  Filled 2019-07-17: qty 20

## 2019-07-17 NOTE — ED Triage Notes (Signed)
Patient has swelling on right middle finger. Patient states that it is painful.

## 2019-07-18 MED ORDER — SULFAMETHOXAZOLE-TRIMETHOPRIM 800-160 MG PO TABS
1.0000 | ORAL_TABLET | Freq: Once | ORAL | Status: AC
  Administered 2019-07-18: 1 via ORAL
  Filled 2019-07-18: qty 1

## 2019-07-18 MED ORDER — SULFAMETHOXAZOLE-TRIMETHOPRIM 800-160 MG PO TABS: 1.0000 | ORAL_TABLET | Freq: Two times a day (BID) | ORAL | 0 refills | Status: DC

## 2019-07-18 NOTE — ED Provider Notes (Addendum)
[facility] DEPT Provider Note   CSN: [id number] Arrival date & time: 07/17/19  2313     History Chief Complaint  Patient presents with  . Finger Injury    Jackie Chavez is a 24 y.o. female.  Patient presents to the emergency department for evaluation of pain and swelling of her right middle finger.  Patient reports that symptoms began approximately 2 weeks ago.  She reports that she had a similar process happen when she was younger and it needed to be drained.  She tried to drain herself but was not able to get much out.  Since then the finger has become much more painful and swollen.        Past Medical History:  Diagnosis Date  . Anemia   . Anxiety   . BV (bacterial vaginosis)   . Chronic hypertension   . Headache    migraines  . Hx of chlamydia infection 10/17/2016  . Pregnancy induced hypertension    preeclampsia    Patient Active Problem List   Diagnosis Date Noted  . Normal labor 02/20/2019  . Limited prenatal care in third trimester 02/19/2019  . Supervision of high risk pregnancy, antepartum 10/29/2018  . History of cesarean section 10/29/2018  . Chronic hypertension 10/23/2018    Past Surgical History:  Procedure Laterality Date  . CESAREAN SECTION MULTI-GESTATIONAL N/A 05/05/2017   Procedure: CESAREAN SECTION MULTI-GESTATIONAL;  Surgeon: Mora Bellman, MD;  Location: [facility];  Service: Obstetrics;  Laterality: N/A;  . WOUND DEBRIDEMENT N/A 05/10/2017   Procedure: DEBRIDEMENT ABDOMINAL WOUND;  Surgeon: Florian Buff, MD;  Location: [facility];  Service: Gynecology;  Laterality: N/A;  physcian is requesting a wound vac     OB History    Gravida  3   Para  3   Term  1   Preterm  1   AB      Living  3     SAB      TAB      Ectopic      Multiple  1   Live Births  3           Family History  Problem Relation Age of Onset  . COPD Mother   . Diabetes Mother   . Mental illness Mother     . Alcohol abuse Mother   . Drug abuse Mother   . Diabetes Father   . Hypertension Father   . Alcohol abuse Father   . Drug abuse Father     Social History   Tobacco Use  . Smoking status: Current Every Day Smoker    Types: Cigarettes  . Smokeless tobacco: Never Used  Substance Use Topics  . Alcohol use: No  . Drug use: No    Home Medications Prior to Admission medications   Medication Sig Start Date End Date Taking? Authorizing Provider  albuterol (VENTOLIN HFA) 108 (90 Base) MCG/ACT inhaler Inhale 2 puffs into the lungs every 4 (four) hours as needed for wheezing or shortness of breath. 12/22/18   Jorje Guild, NP  aspirin EC 81 MG tablet Take 1 tablet (81 mg total) by mouth daily. Take after 12 weeks for prevention of preeclampsia later in pregnancy Patient not taking: Reported on 02/19/2019 10/29/18   Chancy Milroy, MD  Blood Pressure Monitor KIT 1 kit by Does not apply route once a week. Check BP weekly.  DX: Z13.6        Z34.90 10/29/18   Rip Harbour,  Audrie Lia, MD  Elastic Bandages & Supports (COMFORT FIT MATERNITY SUPP LG) MISC Use as directed. Patient not taking: Reported on 02/19/2019 11/27/18   Glenice Bow, DO  ferrous sulfate 325 (65 FE) MG tablet Take 1 tablet (325 mg total) by mouth daily with breakfast. 02/23/19   Nicolette Bang, DO  ibuprofen (ADVIL) 600 MG tablet Take 1 tablet (600 mg total) by mouth every 6 (six) hours. 02/22/19   Nicolette Bang, DO  NIFEdipine (PROCARDIA-XL/NIFEDICAL-XL) 30 MG 24 hr tablet Take 1 tablet (30 mg total) by mouth daily. 12/22/18   Jorje Guild, NP  norelgestromin-ethinyl estradiol (ORTHO EVRA) 150-35 MCG/24HR transdermal patch Place 1 patch onto the skin once a week. 03/23/19   Constant, Peggy, MD  Prenatal MV-Min-FA-Omega-3 (PRENATAL GUMMIES/DHA & FA) 0.4-32.5 MG CHEW Chew by mouth.    [provider]  senna-docusate (SENOKOT-S) 8.6-50 MG tablet Take 2 tablets by mouth daily. Patient not taking: Reported  on 03/23/2019 02/23/19   Nicolette Bang, DO  sulfamethoxazole-trimethoprim (BACTRIM DS) 800-160 MG tablet Take 1 tablet by mouth 2 (two) times daily. 07/18/19   Orpah Greek, MD    Allergies    Coconut flavor and Latex  Review of Systems   Review of Systems  Constitutional: Negative for fever.  Skin: Positive for color change and wound.    Physical Exam Updated Vital Signs BP (!) 165/84 (BP Location: Right Arm)   Pulse 96   Temp 98.4 F (36.9 C) (Oral)   Resp 16   Ht '5\' 1"'  (1.549 m)   Wt 94.3 kg   LMP 06/10/2019   SpO2 100%   BMI 39.30 kg/m   Physical Exam Vitals and nursing note reviewed.  Constitutional:      Appearance: Normal appearance.  HENT:     Head: Normocephalic.  Pulmonary:     Effort: Pulmonary effort is normal.  Musculoskeletal:        General: Tenderness (Redness, swelling dorsal aspect of distal right third finger) present.  Skin:    Capillary Refill: Capillary refill takes less than 2 seconds.     Findings: Erythema ([place] of distal right third finger with fluctuance around proximal nail bed) present.  Neurological:     General: No focal deficit present.     Mental Status: She is alert.       ED Results / Procedures / Treatments   Labs (all labs ordered are listed, but only abnormal results are displayed) Labs Reviewed - No data to display  EKG None  Radiology DG Finger Middle Right  Result Date: 07/17/2019 CLINICAL DATA:  Pain. EXAM: RIGHT MIDDLE FINGER 2+V COMPARISON:  None. FINDINGS: There is no acute displaced fracture or dislocation. There is soft tissue swelling about the distal phalanx of the third digit. There is no underlying radiographic evidence for osteomyelitis. There is no radiopaque foreign body. IMPRESSION: Soft tissue swelling about the distal phalanx of the third digit without underlying radiographic evidence for osteomyelitis or an acute displaced fracture. No radiopaque foreign body. Electronically  Signed   By: Constance Holster M.D.   On: 07/17/2019 23:50    Procedures .Marland KitchenIncision and Drainage  Date/Time: 07/18/2019 12:27 AM Performed by: Orpah Greek, MD Authorized by: Orpah Greek, MD   Consent:    Consent obtained:  Verbal   Consent given by:  Patient   Risks discussed:  Bleeding, incomplete drainage and pain Universal protocol:    Procedure explained and questions answered to patient or proxy's satisfaction: yes  Relevant documents present and verified: yes     Test results available and properly labeled: yes     Imaging studies available: yes     Required blood products, implants, devices, and special equipment available: yes     Site/side marked: yes     Immediately prior to procedure a time out was called: yes     Patient identity confirmed:  Verbally with patient Location:    Type:  Abscess   Location:  Upper extremity   Upper extremity location:  Finger   Finger location:  R long finger Pre-procedure details:    Skin preparation:  Betadine Anesthesia (see MAR for exact dosages):    Anesthesia method:  Nerve block   Block location:  Finger   Block anesthetic:  Lidocaine 2% w/o epi   Block technique:  Digital   Block injection procedure:  Anatomic landmarks identified, introduced needle, incremental injection, anatomic landmarks palpated and negative aspiration for blood   Block outcome:  Anesthesia achieved Procedure type:    Complexity:  Simple Procedure details:    Incision types:  Single straight   Scalpel blade:  11   Wound management:  Probed and deloculated   Drainage:  Purulent   Drainage amount:  Moderate   Wound treatment:  Wound left open Post-procedure details:    Patient tolerance of procedure:  Tolerated well, no immediate complications   (including critical care time)  Medications Ordered in ED Medications  sulfamethoxazole-trimethoprim (BACTRIM DS) 800-160 MG per tablet 1 tablet (has no administration in time  range)  lidocaine (XYLOCAINE) 2 % (with pres) injection 200 mg (200 mg Infiltration Given by Other 07/18/19 0008)    ED Course  I have reviewed the triage vital signs and the nursing notes.  Pertinent labs & imaging results that were available during my care of the patient were reviewed by me and considered in my medical decision making (see chart for details).    MDM Rules/Calculators/[name]                      Patient presents with large paronychia of the right middle finger.  This was drained and a sterile dressing was placed.  Will start on Bactrim.  Follow-up as needed.  Final Clinical Impression(s) / ED Diagnoses Final diagnoses:  Paronychia of finger of right hand    Rx / DC Orders ED Discharge Orders         Ordered    sulfamethoxazole-trimethoprim (BACTRIM DS) 800-160 MG tablet  2 times daily     07/18/19 0026           Orpah Greek, MD 07/18/19 0028    Orpah Greek, MD 07/18/19 [phone]

## 2019-08-25 ENCOUNTER — Emergency Department (HOSPITAL_COMMUNITY)
Admission: EM | Admit: 2019-08-25 | Discharge: 2019-08-25 | Disposition: A | Discharge status: Home / Self Care | Payer: Medicaid Other | Attending: Emergency Medicine | Admitting: Emergency Medicine

## 2019-08-25 ENCOUNTER — Other Ambulatory Visit: Payer: Self-pay

## 2019-08-25 DIAGNOSIS — Z79899 Other long term (current) drug therapy: Secondary | ICD-10-CM | POA: Insufficient documentation

## 2019-08-25 DIAGNOSIS — F17210 Nicotine dependence, cigarettes, uncomplicated: Secondary | ICD-10-CM | POA: Diagnosis not present

## 2019-08-25 DIAGNOSIS — L03011 Cellulitis of right finger: Secondary | ICD-10-CM | POA: Insufficient documentation

## 2019-08-25 DIAGNOSIS — Z91040 Latex allergy status: Secondary | ICD-10-CM | POA: Diagnosis not present

## 2019-08-25 DIAGNOSIS — I10 Essential (primary) hypertension: Secondary | ICD-10-CM | POA: Diagnosis not present

## 2019-08-25 DIAGNOSIS — Z7982 Long term (current) use of aspirin: Secondary | ICD-10-CM | POA: Insufficient documentation

## 2019-08-25 MED ORDER — LIDOCAINE HCL 1 % IJ SOLN
INTRAMUSCULAR | Status: AC
  Filled 2019-08-25: qty 20

## 2019-08-25 MED ORDER — CEPHALEXIN 500 MG PO CAPS: 500.0000 mg | ORAL_CAPSULE | Freq: Two times a day (BID) | ORAL | 0 refills | Status: AC

## 2019-08-25 NOTE — ED Provider Notes (Signed)
[facility] DEPT Provider Note   CSN: [id number] Arrival date & time: 08/25/19  2112     History Chief Complaint  Patient presents with  . Finger Infection    Jackie Chavez is a 24 y.o. female.  Patient here with swelling just below her right middle finger fingernail.  History of the same.  The history is provided by the patient.  Hand Pain This is a new problem. The current episode started 2 days ago. The problem occurs constantly. The problem has not changed since onset.Nothing aggravates the symptoms. Nothing relieves the symptoms. She has tried nothing for the symptoms. The treatment provided no relief.       Past Medical History:  Diagnosis Date  . Anemia   . Anxiety   . BV (bacterial vaginosis)   . Chronic hypertension   . Headache    migraines  . Hx of chlamydia infection 10/17/2016  . Pregnancy induced hypertension    preeclampsia    Patient Active Problem List   Diagnosis Date Noted  . Normal labor 02/20/2019  . Limited prenatal care in third trimester 02/19/2019  . Supervision of high risk pregnancy, antepartum 10/29/2018  . History of cesarean section 10/29/2018  . Chronic hypertension 10/23/2018    Past Surgical History:  Procedure Laterality Date  . CESAREAN SECTION MULTI-GESTATIONAL N/A 05/05/2017   Procedure: CESAREAN SECTION MULTI-GESTATIONAL;  Surgeon: Mora Bellman, MD;  Location: [facility];  Service: Obstetrics;  Laterality: N/A;  . WOUND DEBRIDEMENT N/A 05/10/2017   Procedure: DEBRIDEMENT ABDOMINAL WOUND;  Surgeon: Florian Buff, MD;  Location: [facility];  Service: Gynecology;  Laterality: N/A;  physcian is requesting a wound vac     OB History    Gravida  3   Para  3   Term  1   Preterm  1   AB      Living  3     SAB      TAB      Ectopic      Multiple  1   Live Births  3           Family History  Problem Relation Age of Onset  . COPD Mother   . Diabetes Mother   .  Mental illness Mother   . Alcohol abuse Mother   . Drug abuse Mother   . Diabetes Father   . Hypertension Father   . Alcohol abuse Father   . Drug abuse Father     Social History   Tobacco Use  . Smoking status: Current Every Day Smoker    Types: Cigarettes  . Smokeless tobacco: Never Used  Substance Use Topics  . Alcohol use: No  . Drug use: No    Home Medications Prior to Admission medications   Medication Sig Start Date End Date Taking? Authorizing Provider  albuterol (VENTOLIN HFA) 108 (90 Base) MCG/ACT inhaler Inhale 2 puffs into the lungs every 4 (four) hours as needed for wheezing or shortness of breath. 12/22/18   Jorje Guild, NP  aspirin EC 81 MG tablet Take 1 tablet (81 mg total) by mouth daily. Take after 12 weeks for prevention of preeclampsia later in pregnancy Patient not taking: Reported on 02/19/2019 10/29/18   Chancy Milroy, MD  Blood Pressure Monitor KIT 1 kit by Does not apply route once a week. Check BP weekly.  DX: Z13.6        Z34.90 10/29/18   Chancy Milroy, MD  cephALEXin [facility])  500 MG capsule Take 1 capsule (500 mg total) by mouth 2 (two) times daily for 7 days. 08/25/19 09/01/19  Lennice Sites, DO  Elastic Bandages & Supports (COMFORT FIT MATERNITY SUPP LG) MISC Use as directed. Patient not taking: Reported on 02/19/2019 11/27/18   Glenice Bow, DO  ferrous sulfate 325 (65 FE) MG tablet Take 1 tablet (325 mg total) by mouth daily with breakfast. 02/23/19   Nicolette Bang, DO  ibuprofen (ADVIL) 600 MG tablet Take 1 tablet (600 mg total) by mouth every 6 (six) hours. 02/22/19   Nicolette Bang, DO  NIFEdipine (PROCARDIA-XL/NIFEDICAL-XL) 30 MG 24 hr tablet Take 1 tablet (30 mg total) by mouth daily. 12/22/18   Jorje Guild, NP  norelgestromin-ethinyl estradiol (ORTHO EVRA) 150-35 MCG/24HR transdermal patch Place 1 patch onto the skin once a week. 03/23/19   Constant, Peggy, MD  Prenatal MV-Min-FA-Omega-3 (PRENATAL GUMMIES/DHA & FA)  0.4-32.5 MG CHEW Chew by mouth.    [provider]  senna-docusate (SENOKOT-S) 8.6-50 MG tablet Take 2 tablets by mouth daily. Patient not taking: Reported on 03/23/2019 02/23/19   Nicolette Bang, DO  sulfamethoxazole-trimethoprim (BACTRIM DS) 800-160 MG tablet Take 1 tablet by mouth 2 (two) times daily. 07/18/19   Orpah Greek, MD    Allergies    Coconut flavor and Latex  Review of Systems   Review of Systems  Constitutional: Negative for fever.  Musculoskeletal:       Finger swelling    Skin: Positive for color change. Negative for pallor, rash and wound.  Neurological: Negative for weakness and numbness.    Physical Exam Updated Vital Signs BP (!) 158/87 (BP Location: Left Arm)   Pulse 86   Temp 98.1 F (36.7 C) (Oral)   Resp 19   Ht '5\' 1"'  (1.549 m)   Wt 94.3 kg   LMP 08/05/2019   SpO2 100%   BMI 39.30 kg/m   Physical Exam Constitutional:      General: She is not in acute distress.    Appearance: She is not ill-appearing.  Musculoskeletal:        General: Swelling present.     Comments: Right middle finger paronychia, no swelling of the fingers elsewhere  Skin:    General: Skin is warm.     Capillary Refill: Capillary refill takes less than 2 seconds.  Neurological:     Mental Status: She is alert.     Sensory: No sensory deficit.     Motor: No weakness.     ED Results / Procedures / Treatments   Labs (all labs ordered are listed, but only abnormal results are displayed) Labs Reviewed - No data to display  EKG None  Radiology No results found.  Procedures .Marland KitchenIncision and Drainage  Date/Time: 08/25/2019 10:31 PM Performed by: Lennice Sites, DO Authorized by: Lennice Sites, DO   Consent:    Consent obtained:  Verbal   Consent given by:  Patient   Risks discussed:  Bleeding, damage to other organs, incomplete drainage, infection and pain   Alternatives discussed:  No treatment Location:    Indications for incision  and drainage: paranychia to right middle finger. Pre-procedure details:    Skin preparation:  Chloraprep Anesthesia (see MAR for exact dosages):    Anesthesia method:  Local infiltration   Local anesthetic:  Lidocaine 1% w/o epi Procedure type:    Complexity:  Simple Procedure details:    Incision types:  Stab incision   Incision depth:  Subungual  Scalpel blade:  11   Wound management:  Probed and deloculated   Drainage:  Serosanguinous   Drainage amount:  Scant   Packing materials:  None Post-procedure details:    Patient tolerance of procedure:  Tolerated well, no immediate complications   (including critical care time)  Medications Ordered in ED Medications  lidocaine (XYLOCAINE) 1 % (with pres) injection (has no administration in time range)    ED Course  I have reviewed the triage vital signs and the nursing notes.  Pertinent labs & imaging results that were available during my care of the patient were reviewed by me and considered in my medical decision making (see chart for details).    MDM Rules/Calculators/[name]                      Jackie Chavez is a 24 year old female who presents the ED with right middle finger swelling.  Patient with paronychia this was successfully I&D.  Will start patient on Keflex.  Wound care instructions given.  Discharged from ED in good condition.  No concern for deeper space infection or flexor tenosynovitis.  This chart was dictated using voice recognition software.  Despite best efforts to proofread,  errors can occur which can change the documentation meaning.   Final Clinical Impression(s) / ED Diagnoses Final diagnoses:  Paronychia of finger of right hand    Rx / DC Orders ED Discharge Orders         Ordered    cephALEXin (KEFLEX) 500 MG capsule  2 times daily     08/25/19 2234           Lennice Sites, DO 08/25/19 2235
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
## 2019-11-13 ENCOUNTER — Ambulatory Visit
Admission: EM | Admit: 2019-11-13 | Discharge: 2019-11-13 | Disposition: A | Discharge status: Home / Self Care | Payer: Medicaid Other | Attending: Physician Assistant | Admitting: Physician Assistant

## 2019-11-13 DIAGNOSIS — R509 Fever, unspecified: Secondary | ICD-10-CM | POA: Diagnosis not present

## 2019-11-13 DIAGNOSIS — J039 Acute tonsillitis, unspecified: Secondary | ICD-10-CM | POA: Diagnosis not present

## 2019-11-13 LAB — POCT RAPID STREP A (OFFICE): Rapid Strep A Screen: NEGATIVE

## 2019-11-13 MED ORDER — AMOXICILLIN 500 MG PO CAPS: 500.0000 mg | ORAL_CAPSULE | Freq: Two times a day (BID) | ORAL | 0 refills | Status: DC

## 2019-11-13 MED ORDER — LIDOCAINE VISCOUS HCL 2 % MT SOLN: 15.0000 mL | OROMUCOSAL | 0 refills | Status: DC | PRN

## 2019-11-13 NOTE — ED Triage Notes (Signed)
Pt c/o sore throat x5 days, now having fever and white patches on tonsils.

## 2019-11-13 NOTE — Discharge Instructions (Signed)
Rapid strep negative. However, given your exam, will cover you empirically for bacterial infection with amoxicillin. As discussed, symptoms can still be due to viral illness/ drainage down your throat. You have chosen not to test for COVID, please quarantine until symptoms resolve. This usually takes 7-10 days to resolve. Start lidocaine for sore throat, do not eat or drink for the next 40 mins after use as it can stunt your gag reflex. You can take over the counter allergy medicine such as Flonase, Zyrtec-D for nasal congestion/drainage. You can use over the counter nasal saline rinse such as neti pot for nasal congestion. Monitor for any worsening of symptoms, swelling of the throat, trouble breathing, trouble swallowing, leaning forward to breath, drooling, go to the emergency department for further evaluation needed.

## 2019-11-13 NOTE — ED Provider Notes (Signed)
[facility]    CSN: [id number] Arrival date & time: 11/13/19  0919     History   Chief Complaint Chief Complaint  Patient presents with  . Sore Throat    HPI Jackie Chavez is a 24 y.o. female.   24 year old female comes in for 5 day of sore throat, fever. tmax 101. Denies rhinorrhea, nasal congestion cough. Denies abdominal pain, nausea, vomiting, diarrhea. Denies shortness of breath, loss of taste/smell. Current every day smoker. Tylenol without relief. No COVID vaccine.      Past Medical History:  Diagnosis Date  . Anemia   . Anxiety   . BV (bacterial vaginosis)   . Chronic hypertension   . Headache    migraines  . Hx of chlamydia infection 10/17/2016  . Pregnancy induced hypertension    preeclampsia    Patient Active Problem List   Diagnosis Date Noted  . Normal labor 02/20/2019  . Limited prenatal care in third trimester 02/19/2019  . Supervision of high risk pregnancy, antepartum 10/29/2018  . History of cesarean section 10/29/2018  . Chronic hypertension 10/23/2018    Past Surgical History:  Procedure Laterality Date  . CESAREAN SECTION MULTI-GESTATIONAL N/A 05/05/2017   Procedure: CESAREAN SECTION MULTI-GESTATIONAL;  Surgeon: Catalina Antigua, MD;  Location: [facility];  Service: Obstetrics;  Laterality: N/A;  . WOUND DEBRIDEMENT N/A 05/10/2017   Procedure: DEBRIDEMENT ABDOMINAL WOUND;  Surgeon: [name], MD;  Location: [facility];  Service: Gynecology;  Laterality: N/A;  physcian is requesting a wound vac    OB History    Gravida  3   Para  3   Term  1   Preterm  1   AB      Living  3     SAB      TAB      Ectopic      Multiple  1   Live Births  3            Home Medications    Prior to Admission medications   Medication Sig Start Date End Date Taking? Authorizing Provider  amoxicillin (AMOXIL) 500 MG capsule Take 1 capsule (500 mg total) by mouth 2 (two) times daily. 11/13/19   Cathie Hoops, Aarya Robinson V, PA-C    lidocaine (XYLOCAINE) 2 % solution Use as directed 15 mLs in the mouth or throat as needed for mouth pain. 11/13/19   Belinda Fisher, PA-C    Family History Family History  Problem Relation Age of Onset  . COPD Mother   . Diabetes Mother   . Mental illness Mother   . Alcohol abuse Mother   . Drug abuse Mother   . Diabetes Father   . Hypertension Father   . Alcohol abuse Father   . Drug abuse Father     Social History Social History   Tobacco Use  . Smoking status: Current Every Day Smoker    Types: Cigarettes  . Smokeless tobacco: Never Used  Vaping Use  . Vaping Use: Never used  Substance Use Topics  . Alcohol use: No  . Drug use: No     Allergies   Coconut flavor and Latex   Review of Systems Review of Systems  Reason unable to perform ROS: See HPI as above.    Physical Exam Triage Vital Signs ED Triage Vitals  Enc Vitals Group     BP 11/13/19 0945 120/68     Pulse Rate 11/13/19 0945 94  Resp 11/13/19 0945 18     Temp 11/13/19 0945 (!) 101.1 F (38.4 C)     Temp Source 11/13/19 0945 Oral     SpO2 11/13/19 0945 96 %     Weight --      Height --      Head Circumference --      Peak Flow --      Pain Score 11/13/19 1002 7     Pain Loc --      Pain Edu? --      Excl. in [place]? --    No data found.  Updated Vital Signs BP 120/68 (BP Location: Left Arm)   Pulse 94   Temp (!) 101.1 F (38.4 C) (Oral)   Resp 18   LMP 11/11/2019   SpO2 96%   Breastfeeding No   Physical Exam Constitutional:      General: She is not in acute distress.    Appearance: Normal appearance. She is well-developed. She is not ill-appearing, toxic-appearing or diaphoretic.  HENT:     Head: Normocephalic and atraumatic.     Right Ear: Tympanic membrane, ear canal and external ear normal. Tympanic membrane is not erythematous or bulging.     Left Ear: Tympanic membrane, ear canal and external ear normal. Tympanic membrane is not erythematous or bulging.     Nose:     Right  Sinus: No maxillary sinus tenderness or frontal sinus tenderness.     Left Sinus: No maxillary sinus tenderness or frontal sinus tenderness.     Mouth/Throat:     Mouth: Mucous membranes are moist.     Pharynx: Oropharynx is clear. Uvula midline.     Tonsils: Tonsillar exudate present. 2+ on the right. 2+ on the left.  Eyes:     Conjunctiva/sclera: Conjunctivae normal.     Pupils: Pupils are equal, round, and reactive to light.  Cardiovascular:     Rate and Rhythm: Normal rate and regular rhythm.  Pulmonary:     Effort: Pulmonary effort is normal. No accessory muscle usage, prolonged expiration, respiratory distress or retractions.     Breath sounds: No decreased air movement or transmitted upper airway sounds. No decreased breath sounds.     Comments: LCTAB Musculoskeletal:     Cervical back: Normal range of motion and neck supple.  Skin:    General: Skin is warm and dry.  Neurological:     Mental Status: She is alert and oriented to person, place, and time.      UC Treatments / Results  Labs (all labs ordered are listed, but only abnormal results are displayed) Labs Reviewed  CULTURE, GROUP A STREP [facility])  POCT RAPID STREP A (OFFICE)    EKG   Radiology No results found.  Procedures Procedures (including critical care time)  Medications Ordered in UC Medications - No data to display  Initial Impression / Assessment and Plan / UC Course  I have reviewed the triage vital signs and the nursing notes.  Pertinent labs & imaging results that were available during my care of the patient were reviewed by me and considered in my medical decision making (see chart for details).    Rapid strep negative. Discussed cannot rule out COVID causing symptoms, patient declined testing, to quarantine until symptoms improve. Given history and exam, will cover for tonsillitis with amoxicillin.  Other symptomatic treatment discussed.  Return precautions given.  Patient expresses  understanding and agrees to plan.  Final Clinical Impressions(s) / UC Diagnoses  Final diagnoses:  Acute tonsillitis, unspecified etiology  Fever, unspecified   ED Prescriptions    Medication Sig Dispense Auth. Provider   amoxicillin (AMOXIL) 500 MG capsule Take 1 capsule (500 mg total) by mouth 2 (two) times daily. 20 capsule Bluford Sedler V, PA-C   lidocaine (XYLOCAINE) 2 % solution Use as directed 15 mLs in the mouth or throat as needed for mouth pain. 100 mL Belinda Fisher, PA-C     PDMP not reviewed this encounter.   Belinda Fisher, PA-C 11/13/19 1036

## 2019-11-16 LAB — CULTURE, GROUP A STREP (THRC)

## 2019-12-14 DIAGNOSIS — Z5181 Encounter for therapeutic drug level monitoring: Secondary | ICD-10-CM | POA: Diagnosis not present

## 2020-06-11 ENCOUNTER — Emergency Department (HOSPITAL_COMMUNITY): Payer: Medicaid Other

## 2020-06-11 ENCOUNTER — Emergency Department (HOSPITAL_COMMUNITY)
Admission: EM | Admit: 2020-06-11 | Discharge: 2020-06-12 | Disposition: A | Discharge status: Home / Self Care | Payer: Medicaid Other | Attending: Emergency Medicine | Admitting: Emergency Medicine

## 2020-06-11 ENCOUNTER — Encounter (HOSPITAL_COMMUNITY): Payer: Self-pay | Admitting: Emergency Medicine

## 2020-06-11 ENCOUNTER — Other Ambulatory Visit: Payer: Self-pay

## 2020-06-11 DIAGNOSIS — F17210 Nicotine dependence, cigarettes, uncomplicated: Secondary | ICD-10-CM | POA: Insufficient documentation

## 2020-06-11 DIAGNOSIS — Z043 Encounter for examination and observation following other accident: Secondary | ICD-10-CM | POA: Diagnosis not present

## 2020-06-11 DIAGNOSIS — W009XXA Unspecified fall due to ice and snow, initial encounter: Secondary | ICD-10-CM | POA: Diagnosis not present

## 2020-06-11 DIAGNOSIS — Y92481 Parking lot as the place of occurrence of the external cause: Secondary | ICD-10-CM | POA: Insufficient documentation

## 2020-06-11 DIAGNOSIS — R52 Pain, unspecified: Secondary | ICD-10-CM

## 2020-06-11 DIAGNOSIS — M545 Low back pain, unspecified: Secondary | ICD-10-CM | POA: Diagnosis not present

## 2020-06-11 DIAGNOSIS — S99912A Unspecified injury of left ankle, initial encounter: Secondary | ICD-10-CM | POA: Diagnosis present

## 2020-06-11 DIAGNOSIS — S82852A Displaced trimalleolar fracture of left lower leg, initial encounter for closed fracture: Secondary | ICD-10-CM | POA: Insufficient documentation

## 2020-06-11 DIAGNOSIS — T1490XA Injury, unspecified, initial encounter: Secondary | ICD-10-CM | POA: Diagnosis not present

## 2020-06-11 MED ORDER — FENTANYL CITRATE (PF) 100 MCG/2ML IJ SOLN
50.0000 ug | Freq: Once | INTRAMUSCULAR | Status: AC
  Administered 2020-06-12: 50 ug via INTRAVENOUS
  Filled 2020-06-11: qty 2

## 2020-06-11 NOTE — ED Provider Notes (Signed)
[facility]ENCY DEPT [facility] Emergency Department Provider Note MRN:  [id number]  Arrival date & time: 06/12/20     Chief Complaint   Fall and Ankle Pain   History of Present Illness   Jackie Chavez is a 25 y.o. year-old female with presenting to the ED with chief complaint of ankle pain.  Patient fell and slipped on the ice.  Severe ankle pain since that time.  Mild low back pain as well.  No head trauma, no LOC, no neck pain, no chest pain or SOB, no abdominal pain.  Constant pain worse with motion.  Review of Systems  A complete 10 system review of systems was obtained and all systems are negative except as noted in the HPI and PMH.   Patient's Health History    Past Medical History:  Diagnosis Date  . Anemia   . Anxiety   . BV (bacterial vaginosis)   . Chronic hypertension   . Headache    migraines  . Hx of chlamydia infection 10/17/2016  . Pregnancy induced hypertension    preeclampsia    Past Surgical History:  Procedure Laterality Date  . CESAREAN SECTION MULTI-GESTATIONAL N/A 05/05/2017   Procedure: CESAREAN SECTION MULTI-GESTATIONAL;  Surgeon: Catalina Antigua, MD;  Location: [facility];  Service: Obstetrics;  Laterality: N/A;  . WOUND DEBRIDEMENT N/A 05/10/2017   Procedure: DEBRIDEMENT ABDOMINAL WOUND;  Surgeon: [name], MD;  Location: [facility] ORS;  Service: Gynecology;  Laterality: N/A;  physcian is requesting a wound vac    Family History  Problem Relation Age of Onset  . COPD Mother   . Diabetes Mother   . Mental illness Mother   . Alcohol abuse Mother   . Drug abuse Mother   . Diabetes Father   . Hypertension Father   . Alcohol abuse Father   . Drug abuse Father     Social History   Socioeconomic History  . Marital status: Single    Spouse name: Not on file  . Number of children: Not on file  . Years of education: Not on file  . Highest education level: Not on file  Occupational History  . Not on file  Tobacco Use  .  Smoking status: Current Every Day Smoker    Types: Cigarettes  . Smokeless tobacco: Never Used  Vaping Use  . Vaping Use: Never used  Substance and Sexual Activity  . Alcohol use: No  . Drug use: No  . Sexual activity: Not Currently    Birth control/protection: None  Other Topics Concern  . Not on file  Social History Narrative  . Not on file   Social Determinants of Health   Financial Resource Strain: Not on file  Food Insecurity: Not on file  Transportation Needs: Not on file  Physical Activity: Not on file  Stress: Not on file  Social Connections: Not on file  Intimate Partner Violence: Not on file     Physical Exam   Vitals:   06/12/20 0145 06/12/20 0245  BP: (!) 162/115 (!) 151/74  Pulse: 84 78  Resp: (!) 26 20  Temp:    SpO2: 99% 100%    CONSTITUTIONAL:  well-appearing, NAD NEURO:  Alert and oriented x 3, no focal deficits EYES:  eyes equal and reactive ENT/NECK:  no LAD, no JVD CARDIO:  regular rate, well-perfused, normal S1 and S2 PULM:  CTAB no wheezing or rhonchi GI/GU:  normal bowel sounds, non-distended, non-tender MSK/SPINE:  Gross deformity to left ankle, strong DP  pulse SKIN:  no rash, atraumatic PSYCH:  Appropriate speech and behavior  *Additional and/or pertinent findings included in MDM below  Diagnostic and Interventional Summary    EKG Interpretation  Date/Time:    Ventricular Rate:    PR Interval:    QRS Duration:   QT Interval:    QTC Calculation:   R Axis:     Text Interpretation:        Labs Reviewed - No data to display  CT Ankle Left Wo Contrast  Final Result    DG Ankle Left Port  Final Result    DG Ankle Complete Left  Final Result    DG Foot Complete Left  Final Result    DG Tibia/Fibula Left  Final Result      Medications  fentaNYL (SUBLIMAZE) injection 50 mcg (50 mcg Intravenous Given 06/12/20 0012)  ketamine 50 mg in normal saline 5 mL (10 mg/mL) syringe (86 mg Intravenous Given 06/12/20 0124)  sodium  chloride 0.9 % bolus 1,000 mL (1,000 mLs Intravenous New Bag/Given (Non-Interop) 06/12/20 0050)  ondansetron (ZOFRAN) injection 4 mg (4 mg Intravenous Given 06/12/20 0151)  ketamine (KETALAR) injection (20 mg Intravenous Given 06/12/20 0134)     Procedures  /  Critical Care .Sedation  Date/Time: 06/12/2020 4:27 AM Performed by: Sabas SousBero, Admire Bunnell M, MD Authorized by: Sabas SousBero, Canuto Kingston M, MD   Consent:    Consent obtained:  Verbal and written   Consent given by:  Patient   Risks discussed:  Allergic reaction, dysrhythmia, inadequate sedation, nausea, prolonged hypoxia resulting in organ damage, respiratory compromise necessitating ventilatory assistance and intubation and vomiting Universal protocol:    Immediately prior to procedure, a time out was called: yes     Patient identity confirmed:  Verbally with patient and arm band Indications:    Procedure performed:  Fracture reduction   Procedure necessitating sedation performed by:  Physician performing sedation Pre-sedation assessment:    Time since last food or drink:  4 hours   ASA classification: class 1 - normal, healthy patient     Mouth opening:  3 or more finger widths   Mallampati score:  I - soft palate, uvula, fauces, pillars visible   Neck mobility: normal     Pre-sedation assessments completed and reviewed: airway patency, cardiovascular function, hydration status, mental status, nausea/vomiting, pain level, respiratory function and temperature   Immediate pre-procedure details:    Reviewed: vital signs, relevant labs/tests and NPO status     Verified: bag valve mask available, emergency equipment available, intubation equipment available, IV patency confirmed, oxygen available and suction available   Procedure details (see MAR for exact dosages):    Preoxygenation:  Nasal cannula   Sedation:  Ketamine   Intended level of sedation: deep   Analgesia:  Fentanyl   Intra-procedure monitoring:  Blood pressure monitoring, cardiac  monitor, continuous capnometry, continuous pulse oximetry, frequent LOC assessments and frequent vital sign checks   Intra-procedure events: none     Total Provider sedation time (minutes):  24 Post-procedure details:    Attendance: Constant attendance by certified staff until patient recovered     Recovery: Patient returned to pre-procedure baseline     Post-sedation assessments completed and reviewed: airway patency, cardiovascular function, hydration status, mental status, nausea/vomiting, pain level, respiratory function and temperature     Patient is stable for discharge or admission: yes     Procedure completion:  Tolerated well, no immediate complications Reduction of fracture  Date/Time: 06/12/2020 4:29 AM Performed by: Kennis CarinaBero, Ejay Lashley  M, MD Authorized by: Sabas Sous, MD  Consent: Verbal consent obtained. Written consent obtained. Risks and benefits: risks, benefits and alternatives were discussed Consent given by: patient Patient understanding: patient states understanding of the procedure being performed Patient consent: the patient's understanding of the procedure matches consent given Procedure consent: procedure consent matches procedure scheduled Relevant documents: relevant documents present and verified Test results: test results available and properly labeled Imaging studies: imaging studies available Patient identity confirmed: verbally with patient and arm band Time out: Immediately prior to procedure a "time out" was called to verify the correct patient, procedure, equipment, support staff and site/side marked as required. Local anesthesia used: no  Anesthesia: Local anesthesia used: no  Sedation: Patient sedated: yes Vitals: Vital signs were monitored during sedation.  Patient tolerance: patient tolerated the procedure well with no immediate complications Comments: Fracture and dislocation reduction of left ankle trimalleolar fracture. Neurovascularly intact  with strong DP pulse and brisk cap refill to the toes both before and after reduction.  Marland KitchenSplint Application  Date/Time: 06/12/2020 4:30 AM Performed by: Sabas Sous, MD Authorized by: Sabas Sous, MD   Consent:    Consent obtained:  Verbal   Consent given by:  Patient   Risks, benefits, and alternatives were discussed: yes     Risks discussed:  Discoloration, numbness, pain and swelling Universal protocol:    Procedure explained and questions answered to patient or proxy's satisfaction: yes     Patient identity confirmed:  Verbally with patient and arm band Pre-procedure details:    Distal neurologic exam:  Normal   Distal perfusion: distal pulses strong   Procedure details:    Location:  Ankle   Ankle location:  L ankle   Splint type:  Ankle stirrup   Supplies:  Fiberglass   Attestation: Splint applied and adjusted personally by me   Post-procedure details:    Distal neurologic exam:  Normal   Distal perfusion: distal pulses strong, brisk capillary refill and unchanged     Procedure completion:  Tolerated well, no immediate complications   Post-procedure imaging: reviewed      ED Course and Medical Decision Making  I have reviewed the triage vital signs, the nursing notes, and pertinent available records from the EMR.  Listed above are laboratory and imaging tests that I personally ordered, reviewed, and interpreted and then considered in my medical decision making (see below for details).  XR to eval for fx vs dislocation.       X-ray reveals trimalleolar fracture with dislocation. See above for procedural details of sedation and reduction. Case was discussed with Dr. Shon Baton of [facility], recommending CT imaging. CT imaging was reviewed again with Dr. Shon Baton, patient is appropriate for follow-up on Monday with Dr. Victorino Dike.  Elmer Sow. Pilar Plate, MD [facility] Emergency Medicine [facility] [email or web address]  Final Clinical Impressions(s) / ED  Diagnoses     ICD-10-CM   1. Closed trimalleolar fracture of left ankle, initial encounter  S82.852A   2. Pain  R52 DG Ankle Left Port    DG Ankle Left Port    ED Discharge Orders         Ordered    oxyCODONE (ROXICODONE) 5 MG immediate release tablet  Every 4 hours PRN        06/12/20 0423           Discharge Instructions Discussed with and Provided to Patient:     Discharge Instructions     You were evaluated  in the Emergency Department and after careful evaluation, we did not find any emergent condition requiring admission or further testing in the hospital.  You have a broken ankle, which was also dislocated. This injury will likely require surgery. We reduced the ankle here in the emergency department. The plan is for you to see Dr. Victorino Dike of [facility] on Monday, please call the number provided first thing Monday morning to get an official appointment time. Use Tylenol and/or Motrin at home for discomfort. For more significant pain you can use the oxycodone provided.  Please return to the Emergency Department if you experience any worsening of your condition.  Thank you for allowing [place] to be a part of your care.        Sabas Sous, MD 06/12/20 [phone]

## 2020-06-11 NOTE — ED Triage Notes (Signed)
Per EMS, pt slid on ice in a parking lot. Complaining of left ankle pain. No LOC, not on blood thinners. No other injuries noted.   BP 154/86 HR 98 SpO2 99% RA

## 2020-06-12 ENCOUNTER — Emergency Department (HOSPITAL_COMMUNITY): Payer: Medicaid Other

## 2020-06-12 DIAGNOSIS — Z043 Encounter for examination and observation following other accident: Secondary | ICD-10-CM | POA: Diagnosis not present

## 2020-06-12 DIAGNOSIS — S82852A Displaced trimalleolar fracture of left lower leg, initial encounter for closed fracture: Secondary | ICD-10-CM | POA: Diagnosis not present

## 2020-06-12 MED ORDER — ONDANSETRON HCL 4 MG/2ML IJ SOLN
4.0000 mg | Freq: Once | INTRAMUSCULAR | Status: AC | PRN
  Administered 2020-06-12: 4 mg via INTRAVENOUS
  Filled 2020-06-12: qty 2

## 2020-06-12 MED ORDER — OXYCODONE HCL 5 MG PO TABS: 5.0000 mg | ORAL_TABLET | ORAL | 0 refills | Status: DC | PRN

## 2020-06-12 MED ORDER — SODIUM CHLORIDE 0.9 % IV BOLUS
1000.0000 mL | Freq: Once | INTRAVENOUS | Status: AC
  Administered 2020-06-12: 1000 mL via INTRAVENOUS

## 2020-06-12 MED ORDER — KETAMINE HCL 10 MG/ML IJ SOLN
INTRAMUSCULAR | Status: AC | PRN
  Administered 2020-06-12: 20 mg via INTRAVENOUS
  Administered 2020-06-12: 86 mg via INTRAVENOUS
  Administered 2020-06-12: 14 mg via INTRAVENOUS

## 2020-06-12 MED ORDER — KETAMINE HCL 50 MG/5ML IJ SOSY
PREFILLED_SYRINGE | INTRAMUSCULAR | Status: AC
  Administered 2020-06-12: 86 mg via INTRAVENOUS
  Filled 2020-06-12: qty 5

## 2020-06-12 MED ORDER — KETAMINE HCL 50 MG/5ML IJ SOSY
1.0000 mg/kg | PREFILLED_SYRINGE | Freq: Once | INTRAMUSCULAR | Status: AC
  Filled 2020-06-12: qty 10

## 2020-06-12 NOTE — Discharge Instructions (Addendum)
You were evaluated in the Emergency Department and after careful evaluation, we did not find any emergent condition requiring admission or further testing in the hospital.  You have a broken ankle, which was also dislocated. This injury will likely require surgery. We reduced the ankle here in the emergency department. The plan is for you to see Dr. Victorino Dike of [facility] on Monday, please call the number provided first thing Monday morning to get an official appointment time. Use Tylenol and/or Motrin at home for discomfort. For more significant pain you can use the oxycodone provided.  Please return to the Emergency Department if you experience any worsening of your condition.  Thank you for allowing [place] to be a part of your care.

## 2020-06-12 NOTE — ED Notes (Signed)
Patient refusing vital signs on discharge.

## 2020-06-12 NOTE — Progress Notes (Signed)
Orthopedic Tech Progress Note Patient Details:  Jackie Chavez 08-27-1995 [id number]  Ortho Devices Type of Ortho Device: Post (short leg) splint,Stirrup splint Ortho Device/Splint Location: lle. splint applied with drs assist post reduction. Ortho Device/Splint Interventions: Ordered,Adjustment,Application   Post Interventions Patient Tolerated: Well Instructions Provided: Care of device,Adjustment of device   Trinna Post 06/12/2020, 3:09 AM

## 2020-06-13 ENCOUNTER — Ambulatory Visit (INDEPENDENT_AMBULATORY_CARE_PROVIDER_SITE_OTHER): Payer: Medicaid Other | Admitting: Podiatry

## 2020-06-13 ENCOUNTER — Other Ambulatory Visit: Payer: Self-pay

## 2020-06-13 ENCOUNTER — Ambulatory Visit (INDEPENDENT_AMBULATORY_CARE_PROVIDER_SITE_OTHER): Payer: Medicaid Other

## 2020-06-13 DIAGNOSIS — S82852A Displaced trimalleolar fracture of left lower leg, initial encounter for closed fracture: Secondary | ICD-10-CM | POA: Diagnosis not present

## 2020-06-13 DIAGNOSIS — Z720 Tobacco use: Secondary | ICD-10-CM

## 2020-06-13 DIAGNOSIS — M79672 Pain in left foot: Secondary | ICD-10-CM

## 2020-06-13 MED ORDER — IBUPROFEN 800 MG PO TABS: 800.0000 mg | ORAL_TABLET | Freq: Three times a day (TID) | ORAL | 0 refills | Status: DC | PRN

## 2020-06-13 NOTE — Patient Instructions (Addendum)
Dr Toni Arthurs, MD [facility] [address], [place], [place] [address]  CONTACT Phone: [phone]  Phone: [phone]  Phone: [phone]     Take 1,000mg  tylenol (2 extra strength) every 6 hours as needed. Take the 800mg  ibuprofen every 8 hours as needed. Then take the oxycodone 1-2 tablets every 4-6 hours as needd

## 2020-06-14 ENCOUNTER — Telehealth: Payer: Self-pay | Admitting: Podiatry

## 2020-06-14 ENCOUNTER — Encounter: Payer: Self-pay | Admitting: Podiatry

## 2020-06-14 ENCOUNTER — Telehealth: Payer: Self-pay

## 2020-06-14 NOTE — Progress Notes (Signed)
  Subjective:  Patient ID: Jackie Chavez, female    DOB: 26-May-1995,  MRN: [id number]  Chief Complaint  Patient presents with  . Fracture    PT slipped on black ice and is in a lot of pain .    25 y.o. female presents with the above complaint. History confirmed with patient.  She slipped and fell on ice on Saturday morning.  Was taken to the [facility] emergency room.  X-rays revealed an ankle fracture and she had a CT scan.  I did undergo sedation and reduction as well.  She has today she said she slipped with her crutch and set it down  Objective:  Physical Exam: Splint is intact, did not remove it today.  Her capillary fill time is normal to her toes.  She has intact light touch sensation to the digits.  Normal motor function to dorsiflexion plantarflexion of the digits.  Able to see moderate edema through the splint of the dorsal forefoot   Radiographs: X-ray of left ankle: Trimalleolar ankle fracture, no appreciable change in alignment since postreduction films emergency room Assessment:   1. Closed trimalleolar fracture of left ankle, initial encounter   2. Tobacco use      Plan:  Patient was evaluated and treated and all questions answered.   Reviewed the x-ray and CT scan with the patient.  I discussed with her the nature of the injury and this would require surgery.  We also discussed the likelihood of posttraumatic arthritis at some point in her lifetime.  I think she likely should have operative intervention next 1 to 2 weeks.  She was supposed to follow-up with Dr. Victorino Dike of [facility], she was not able to find his number.  I provided his office information and she will follow up with him for surgical intervention  No follow-ups on file.

## 2020-06-14 NOTE — Telephone Encounter (Signed)
Transition Care Management Unsuccessful Follow-up Telephone Call  Date of discharge and from where:  06/12/2020 [facility] ED  Attempts:  1st Attempt  Reason for unsuccessful TCM follow-up call:  Unable to leave message, voicemail not set up.

## 2020-06-14 NOTE — Telephone Encounter (Signed)
Patient has requested refill on pain meds but stronger doseage, stating she can still feel pain even with meds, Please Advise

## 2020-06-17 ENCOUNTER — Encounter (HOSPITAL_BASED_OUTPATIENT_CLINIC_OR_DEPARTMENT_OTHER): Payer: Self-pay | Admitting: Orthopedic Surgery

## 2020-06-17 ENCOUNTER — Other Ambulatory Visit: Payer: Self-pay

## 2020-06-17 ENCOUNTER — Other Ambulatory Visit (HOSPITAL_COMMUNITY): Payer: Self-pay | Admitting: Orthopedic Surgery

## 2020-06-20 ENCOUNTER — Encounter (HOSPITAL_BASED_OUTPATIENT_CLINIC_OR_DEPARTMENT_OTHER)
Admission: RE | Admit: 2020-06-20 | Discharge: 2020-06-20 | Disposition: A | Discharge status: Home / Self Care | Payer: Medicaid Other | Source: Ambulatory Visit | Attending: Orthopedic Surgery | Admitting: Orthopedic Surgery

## 2020-06-20 NOTE — Progress Notes (Signed)
Called Dr Hewitt's office and informed Tresa Endo that patient did not go for covid test today, not come for her preoperative lab work due to transportation issues. Informed office of the plan for patient to come to [facility] at 8:30 tomorrow morning to have rapid covid testing, labs and EKG completed. Tresa Endo stated she would make Dr Victorino Dike aware of the situation.

## 2020-06-20 NOTE — Progress Notes (Signed)
Instructed pt to be here at 8:30 tomorrow morning for a rapid covid test due to pt not having a ride today, pt verbalized understanding.

## 2020-06-20 NOTE — Progress Notes (Deleted)

## 2020-06-21 ENCOUNTER — Ambulatory Visit (HOSPITAL_BASED_OUTPATIENT_CLINIC_OR_DEPARTMENT_OTHER)
Admission: RE | Admit: 2020-06-21 | Discharge: 2020-06-21 | Disposition: A | Discharge status: Home / Self Care | Payer: Medicaid Other | Attending: Orthopedic Surgery | Admitting: Orthopedic Surgery

## 2020-06-21 ENCOUNTER — Ambulatory Visit (HOSPITAL_BASED_OUTPATIENT_CLINIC_OR_DEPARTMENT_OTHER): Payer: Medicaid Other | Admitting: Anesthesiology

## 2020-06-21 ENCOUNTER — Other Ambulatory Visit: Payer: Self-pay

## 2020-06-21 ENCOUNTER — Encounter (HOSPITAL_BASED_OUTPATIENT_CLINIC_OR_DEPARTMENT_OTHER): Payer: Self-pay | Admitting: Orthopedic Surgery

## 2020-06-21 ENCOUNTER — Encounter (HOSPITAL_BASED_OUTPATIENT_CLINIC_OR_DEPARTMENT_OTHER)
Admission: RE | Disposition: A | Discharge status: Home / Self Care | Payer: Self-pay | Source: Home / Self Care | Attending: Orthopedic Surgery

## 2020-06-21 DIAGNOSIS — W000XXA Fall on same level due to ice and snow, initial encounter: Secondary | ICD-10-CM | POA: Insufficient documentation

## 2020-06-21 DIAGNOSIS — F17210 Nicotine dependence, cigarettes, uncomplicated: Secondary | ICD-10-CM | POA: Insufficient documentation

## 2020-06-21 DIAGNOSIS — Z91018 Allergy to other foods: Secondary | ICD-10-CM | POA: Diagnosis not present

## 2020-06-21 DIAGNOSIS — Z87892 Personal history of anaphylaxis: Secondary | ICD-10-CM | POA: Insufficient documentation

## 2020-06-21 DIAGNOSIS — Z91040 Latex allergy status: Secondary | ICD-10-CM | POA: Insufficient documentation

## 2020-06-21 DIAGNOSIS — Z20822 Contact with and (suspected) exposure to covid-19: Secondary | ICD-10-CM | POA: Insufficient documentation

## 2020-06-21 DIAGNOSIS — S82852A Displaced trimalleolar fracture of left lower leg, initial encounter for closed fracture: Secondary | ICD-10-CM | POA: Diagnosis not present

## 2020-06-21 DIAGNOSIS — S93432A Sprain of tibiofibular ligament of left ankle, initial encounter: Secondary | ICD-10-CM | POA: Insufficient documentation

## 2020-06-21 DIAGNOSIS — I10 Essential (primary) hypertension: Secondary | ICD-10-CM | POA: Diagnosis not present

## 2020-06-21 DIAGNOSIS — G8918 Other acute postprocedural pain: Secondary | ICD-10-CM | POA: Diagnosis not present

## 2020-06-21 HISTORY — DX: Essential (primary) hypertension: I10

## 2020-06-21 HISTORY — PX: ORIF ANKLE FRACTURE: SHX5408

## 2020-06-21 LAB — SARS CORONAVIRUS 2 BY RT PCR (HOSPITAL ORDER, PERFORMED IN ~~LOC~~ HOSPITAL LAB): SARS Coronavirus 2: NEGATIVE

## 2020-06-21 LAB — POCT PREGNANCY, URINE: Preg Test, Ur: NEGATIVE

## 2020-06-21 SURGERY — OPEN REDUCTION INTERNAL FIXATION (ORIF) ANKLE FRACTURE
Anesthesia: Monitor Anesthesia Care | Site: Ankle | Laterality: Left

## 2020-06-21 MED ORDER — ONDANSETRON HCL 4 MG/2ML IJ SOLN
INTRAMUSCULAR | Status: AC
  Filled 2020-06-21: qty 2

## 2020-06-21 MED ORDER — PROPOFOL 500 MG/50ML IV EMUL
INTRAVENOUS | Status: DC | PRN
  Administered 2020-06-21: 50 ug/kg/min via INTRAVENOUS

## 2020-06-21 MED ORDER — LIDOCAINE 2% (20 MG/ML) 5 ML SYRINGE
INTRAMUSCULAR | Status: AC
  Filled 2020-06-21: qty 5

## 2020-06-21 MED ORDER — PHENYLEPHRINE HCL (PRESSORS) 10 MG/ML IV SOLN
INTRAVENOUS | Status: AC
  Filled 2020-06-21: qty 1

## 2020-06-21 MED ORDER — ONDANSETRON HCL 4 MG/2ML IJ SOLN: 4.0000 mg | Freq: Once | INTRAMUSCULAR | Status: DC | PRN

## 2020-06-21 MED ORDER — PROPOFOL 10 MG/ML IV BOLUS
INTRAVENOUS | Status: AC
  Filled 2020-06-21: qty 20

## 2020-06-21 MED ORDER — OXYCODONE HCL 5 MG PO TABS
5.0000 mg | ORAL_TABLET | Freq: Once | ORAL | Status: DC | PRN
Start: 2020-06-21 — End: 2020-06-21

## 2020-06-21 MED ORDER — EPHEDRINE SULFATE-NACL 50-0.9 MG/10ML-% IV SOSY
PREFILLED_SYRINGE | INTRAVENOUS | Status: DC | PRN
  Administered 2020-06-21: 10 mg via INTRAVENOUS
  Administered 2020-06-21 (×2): 5 mg via INTRAVENOUS

## 2020-06-21 MED ORDER — ONDANSETRON HCL 4 MG/2ML IJ SOLN
INTRAMUSCULAR | Status: DC | PRN
  Administered 2020-06-21: 4 mg via INTRAVENOUS

## 2020-06-21 MED ORDER — VANCOMYCIN HCL 500 MG IV SOLR
INTRAVENOUS | Status: DC | PRN
  Administered 2020-06-21: 500 mg via TOPICAL

## 2020-06-21 MED ORDER — SODIUM CHLORIDE 0.9 % IV SOLN: INTRAVENOUS | Status: DC

## 2020-06-21 MED ORDER — GLYCOPYRROLATE PF 0.2 MG/ML IJ SOSY
PREFILLED_SYRINGE | INTRAMUSCULAR | Status: AC
  Filled 2020-06-21: qty 1

## 2020-06-21 MED ORDER — OXYCODONE HCL 5 MG PO TABS
5.0000 mg | ORAL_TABLET | ORAL | 0 refills | Status: AC | PRN
Start: 2020-06-21 — End: 2020-06-26

## 2020-06-21 MED ORDER — MIDAZOLAM HCL 2 MG/2ML IJ SOLN
INTRAMUSCULAR | Status: AC
  Filled 2020-06-21: qty 2

## 2020-06-21 MED ORDER — FENTANYL CITRATE (PF) 100 MCG/2ML IJ SOLN
100.0000 ug | Freq: Once | INTRAMUSCULAR | Status: AC
  Administered 2020-06-21: 100 ug via INTRAVENOUS

## 2020-06-21 MED ORDER — LIDOCAINE 2% (20 MG/ML) 5 ML SYRINGE
INTRAMUSCULAR | Status: DC | PRN
  Administered 2020-06-21: 40 mg via INTRAVENOUS

## 2020-06-21 MED ORDER — SODIUM CHLORIDE (PF) 0.9 % IJ SOLN
INTRAMUSCULAR | Status: DC | PRN
  Administered 2020-06-21: 10 mL via PERINEURAL

## 2020-06-21 MED ORDER — BUPIVACAINE HCL 0.25 % IJ SOLN
INTRAMUSCULAR | Status: DC | PRN
  Administered 2020-06-21: 10 mL

## 2020-06-21 MED ORDER — ACETAMINOPHEN 160 MG/5ML PO SOLN: 325.0000 mg | ORAL | Status: DC | PRN

## 2020-06-21 MED ORDER — PHENYLEPHRINE 40 MCG/ML (10ML) SYRINGE FOR IV PUSH (FOR BLOOD PRESSURE SUPPORT)
PREFILLED_SYRINGE | INTRAVENOUS | Status: DC | PRN
  Administered 2020-06-21 (×4): 80 ug via INTRAVENOUS

## 2020-06-21 MED ORDER — DEXAMETHASONE SODIUM PHOSPHATE 10 MG/ML IJ SOLN
INTRAMUSCULAR | Status: AC
  Filled 2020-06-21: qty 1

## 2020-06-21 MED ORDER — OXYCODONE HCL 5 MG/5ML PO SOLN: 5.0000 mg | Freq: Once | ORAL | Status: DC | PRN

## 2020-06-21 MED ORDER — 0.9 % SODIUM CHLORIDE (POUR BTL) OPTIME
TOPICAL | Status: DC | PRN
  Administered 2020-06-21: 200 mL

## 2020-06-21 MED ORDER — RIVAROXABAN 10 MG PO TABS: 10.0000 mg | ORAL_TABLET | Freq: Every day | ORAL | 0 refills | Status: DC

## 2020-06-21 MED ORDER — FENTANYL CITRATE (PF) 100 MCG/2ML IJ SOLN
INTRAMUSCULAR | Status: AC
  Filled 2020-06-21: qty 2

## 2020-06-21 MED ORDER — PROPOFOL 10 MG/ML IV BOLUS
INTRAVENOUS | Status: DC | PRN
  Administered 2020-06-21: 200 mg via INTRAVENOUS

## 2020-06-21 MED ORDER — DEXMEDETOMIDINE (PRECEDEX) IN NS 20 MCG/5ML (4 MCG/ML) IV SYRINGE
PREFILLED_SYRINGE | INTRAVENOUS | Status: DC | PRN
  Administered 2020-06-21: 4 ug via INTRAVENOUS
  Administered 2020-06-21: 8 ug via INTRAVENOUS
  Administered 2020-06-21 (×2): 4 ug via INTRAVENOUS

## 2020-06-21 MED ORDER — ROPIVACAINE HCL 5 MG/ML IJ SOLN: INTRAMUSCULAR | Status: DC | PRN

## 2020-06-21 MED ORDER — CEFAZOLIN SODIUM-DEXTROSE 2-4 GM/100ML-% IV SOLN
2.0000 g | INTRAVENOUS | Status: AC
  Administered 2020-06-21: 2 g via INTRAVENOUS

## 2020-06-21 MED ORDER — MEPERIDINE HCL 25 MG/ML IJ SOLN: 6.2500 mg | INTRAMUSCULAR | Status: DC | PRN

## 2020-06-21 MED ORDER — PHENYLEPHRINE HCL-NACL 10-0.9 MG/250ML-% IV SOLN
INTRAVENOUS | Status: DC | PRN
  Administered 2020-06-21: 20 ug/min via INTRAVENOUS

## 2020-06-21 MED ORDER — BUPIVACAINE LIPOSOME 1.3 % IJ SUSP
INTRAMUSCULAR | Status: DC | PRN
Start: 2020-06-21 — End: 2020-06-21
  Administered 2020-06-21: 10 mL via PERINEURAL

## 2020-06-21 MED ORDER — MIDAZOLAM HCL 2 MG/2ML IJ SOLN
2.0000 mg | Freq: Once | INTRAMUSCULAR | Status: AC
  Administered 2020-06-21: 2 mg via INTRAVENOUS

## 2020-06-21 MED ORDER — ACETAMINOPHEN 325 MG PO TABS: 325.0000 mg | ORAL_TABLET | ORAL | Status: DC | PRN

## 2020-06-21 MED ORDER — VANCOMYCIN HCL 500 MG IV SOLR
INTRAVENOUS | Status: AC
  Filled 2020-06-21: qty 500

## 2020-06-21 MED ORDER — LACTATED RINGERS IV SOLN: INTRAVENOUS | Status: DC

## 2020-06-21 MED ORDER — FENTANYL CITRATE (PF) 100 MCG/2ML IJ SOLN: 25.0000 ug | INTRAMUSCULAR | Status: DC | PRN

## 2020-06-21 MED ORDER — CEFAZOLIN SODIUM-DEXTROSE 2-4 GM/100ML-% IV SOLN
INTRAVENOUS | Status: AC
  Filled 2020-06-21: qty 100

## 2020-06-21 MED ORDER — DOCUSATE SODIUM 100 MG PO CAPS: 100.0000 mg | ORAL_CAPSULE | Freq: Two times a day (BID) | ORAL | 0 refills | Status: AC

## 2020-06-21 MED ORDER — SENNA 8.6 MG PO TABS: 2.0000 | ORAL_TABLET | Freq: Two times a day (BID) | ORAL | 0 refills | Status: AC

## 2020-06-21 MED ORDER — GLYCOPYRROLATE 0.2 MG/ML IJ SOLN
INTRAMUSCULAR | Status: DC | PRN
  Administered 2020-06-21 (×2): .1 mg via INTRAVENOUS

## 2020-06-21 SURGICAL SUPPLY — 85 items
APL PRP STRL LF DISP 70% ISPRP (MISCELLANEOUS) ×1
BANDAGE ESMARK 6X9 LF (GAUZE/BANDAGES/DRESSINGS) IMPLANT
BIT DRILL 2.5X2.75 QC CALB (BIT) ×1 IMPLANT
BIT DRILL 2.9 CANN QC NONSTRL (BIT) ×1 IMPLANT
BIT DRILL 3.5X5.5 QC CALB (BIT) ×1 IMPLANT
BLADE SURG 15 STRL LF DISP TIS (BLADE) ×2 IMPLANT
BLADE SURG 15 STRL SS (BLADE) ×6
BNDG CMPR 9X4 STRL LF SNTH (GAUZE/BANDAGES/DRESSINGS)
BNDG CMPR 9X6 STRL LF SNTH (GAUZE/BANDAGES/DRESSINGS)
BNDG COHESIVE 4X5 TAN STRL (GAUZE/BANDAGES/DRESSINGS) ×2 IMPLANT
BNDG COHESIVE 6X5 TAN STRL LF (GAUZE/BANDAGES/DRESSINGS) ×2 IMPLANT
BNDG ESMARK 4X9 LF (GAUZE/BANDAGES/DRESSINGS) IMPLANT
BNDG ESMARK 6X9 LF (GAUZE/BANDAGES/DRESSINGS)
CANISTER SUCT 1200ML W/VALVE (MISCELLANEOUS) ×2 IMPLANT
CHLORAPREP W/TINT 26 (MISCELLANEOUS) ×2 IMPLANT
COVER BACK TABLE 60X90IN (DRAPES) ×2 IMPLANT
COVER WAND RF STERILE (DRAPES) IMPLANT
CUFF TOURN SGL QUICK 34 (TOURNIQUET CUFF) ×2
CUFF TOURN SGL QUICK 42 (TOURNIQUET CUFF) IMPLANT
CUFF TRNQT CYL 34X4.125X (TOURNIQUET CUFF) IMPLANT
DECANTER SPIKE VIAL GLASS SM (MISCELLANEOUS) IMPLANT
DRAPE EXTREMITY T 121X128X90 (DISPOSABLE) ×2 IMPLANT
DRAPE OEC MINIVIEW 54X84 (DRAPES) ×2 IMPLANT
DRAPE U-SHAPE 47X51 STRL (DRAPES) ×2 IMPLANT
DRSG MEPITEL 4X7.2 (GAUZE/BANDAGES/DRESSINGS) ×2 IMPLANT
DRSG PAD ABDOMINAL 8X10 ST (GAUZE/BANDAGES/DRESSINGS) ×4 IMPLANT
ELECT REM PT RETURN 9FT ADLT (ELECTROSURGICAL) ×2
ELECTRODE REM PT RTRN 9FT ADLT (ELECTROSURGICAL) ×1 IMPLANT
FIXATION ZIPTIGHT ANKLE SNDSMS (Ankle) IMPLANT
GAUZE SPONGE 4X4 12PLY STRL (GAUZE/BANDAGES/DRESSINGS) ×2 IMPLANT
GLOVE SRG 8 PF TXTR STRL LF DI (GLOVE) ×2 IMPLANT
GLOVE SURG SS PI 6.5 STRL IVOR (GLOVE) ×1 IMPLANT
GLOVE SURG SYN 8.0 (GLOVE) ×4 IMPLANT
GLOVE SURG SYN 8.0 PF PI (GLOVE) IMPLANT
GLOVE SURG UNDER POLY LF SZ7 (GLOVE) ×2 IMPLANT
GLOVE SURG UNDER POLY LF SZ8 (GLOVE) ×4
GOWN STRL REUS W/ TWL LRG LVL3 (GOWN DISPOSABLE) ×1 IMPLANT
GOWN STRL REUS W/ TWL XL LVL3 (GOWN DISPOSABLE) ×2 IMPLANT
GOWN STRL REUS W/TWL LRG LVL3 (GOWN DISPOSABLE) ×2
GOWN STRL REUS W/TWL XL LVL3 (GOWN DISPOSABLE) ×4
K-WIRE ACE 1.6X6 (WIRE) ×6
KWIRE ACE 1.6X6 (WIRE) IMPLANT
NEEDLE HYPO 22GX1.5 SAFETY (NEEDLE) IMPLANT
NS IRRIG 1000ML POUR BTL (IV SOLUTION) ×2 IMPLANT
PACK BASIN DAY SURGERY FS (CUSTOM PROCEDURE TRAY) ×2 IMPLANT
PAD CAST 4YDX4 CTTN HI CHSV (CAST SUPPLIES) ×1 IMPLANT
PADDING CAST ABS 4INX4YD NS (CAST SUPPLIES)
PADDING CAST ABS COTTON 4X4 ST (CAST SUPPLIES) IMPLANT
PADDING CAST COTTON 4X4 STRL (CAST SUPPLIES) ×2
PADDING CAST COTTON 6X4 STRL (CAST SUPPLIES) ×2 IMPLANT
PENCIL SMOKE EVACUATOR (MISCELLANEOUS) ×2 IMPLANT
PLATE ACE 100DEG 7HOLE (Plate) ×1 IMPLANT
SANITIZER HAND PURELL 535ML FO (MISCELLANEOUS) ×2 IMPLANT
SCREW ACE CAN 4.0 30M (Screw) ×1 IMPLANT
SCREW ACE CAN 4.0 40M (Screw) ×2 IMPLANT
SCREW CORTICAL 3.5MM  16MM (Screw) ×8 IMPLANT
SCREW CORTICAL 3.5MM  20MM (Screw) ×2 IMPLANT
SCREW CORTICAL 3.5MM 14MM (Screw) ×1 IMPLANT
SCREW CORTICAL 3.5MM 16MM (Screw) IMPLANT
SCREW CORTICAL 3.5MM 20MM (Screw) IMPLANT
SCREW CORTICAL 3.5MM 40MM (Screw) ×1 IMPLANT
SHEET MEDIUM DRAPE 40X70 STRL (DRAPES) ×2 IMPLANT
SLEEVE SCD COMPRESS KNEE MED (MISCELLANEOUS) ×2 IMPLANT
SPLINT FAST PLASTER 5X30 (CAST SUPPLIES) ×20
SPLINT PLASTER CAST FAST 5X30 (CAST SUPPLIES) ×20 IMPLANT
SPONGE LAP 18X18 RF (DISPOSABLE) ×2 IMPLANT
STOCKINETTE 6  STRL (DRAPES) ×2
STOCKINETTE 6 STRL (DRAPES) ×1 IMPLANT
SUCTION FRAZIER HANDLE 10FR (MISCELLANEOUS) ×2
SUCTION TUBE FRAZIER 10FR DISP (MISCELLANEOUS) ×1 IMPLANT
SUT ETHILON 3 0 PS 1 (SUTURE) ×4 IMPLANT
SUT FIBERWIRE #2 38 T-5 BLUE (SUTURE)
SUT MNCRL AB 3-0 PS2 18 (SUTURE) IMPLANT
SUT VIC AB 0 SH 27 (SUTURE) IMPLANT
SUT VIC AB 2-0 SH 27 (SUTURE) ×2
SUT VIC AB 2-0 SH 27XBRD (SUTURE) ×1 IMPLANT
SUTURE FIBERWR #2 38 T-5 BLUE (SUTURE) IMPLANT
SYR BULB EAR ULCER 3OZ GRN STR (SYRINGE) ×2 IMPLANT
SYR CONTROL 10ML LL (SYRINGE) IMPLANT
TOWEL GREEN STERILE FF (TOWEL DISPOSABLE) ×4 IMPLANT
TUBE CONNECTING 20X1/4 (TUBING) ×2 IMPLANT
UNDERPAD 30X36 HEAVY ABSORB (UNDERPADS AND DIAPERS) ×2 IMPLANT
WASHER FLAT ACE (Orthopedic Implant) ×2 IMPLANT
WASHER PLAIN FLAT ACE NS 3PK (Orthopedic Implant) IMPLANT
ZIPTIGHT ANKLE SYNODESMOSS FIX (Ankle) ×2 IMPLANT

## 2020-06-21 NOTE — Anesthesia Procedure Notes (Addendum)
Anesthesia Regional Block: Adductor canal block   Pre-Anesthetic Checklist: ,, timeout performed, Correct Patient, Correct Site, Correct Laterality, Correct Procedure, Correct Position, site marked, Risks and benefits discussed,  Surgical consent,  Pre-op evaluation,  At surgeon's request and post-op pain management  Laterality: Left  Prep: chloraprep       Needles:  Injection technique: Single-shot  Needle Type: Echogenic Stimulator Needle     Needle Length: 5cm  Needle Gauge: 22     Additional Needles:   Procedures:, nerve stimulator,,, ultrasound used (permanent image in chart),,,,  Narrative:  Start time: 06/21/2020 12:15 PM End time: 06/21/2020 12:20 PM Injection made incrementally with aspirations every 5 mL.  Performed by: Personally  Anesthesiologist: Bethena Midget, MD  Additional Notes: Functioning IV was confirmed and monitors were applied.  A 55mm 22ga Arrow echogenic stimulator needle was used. Sterile prep and drape,hand hygiene and sterile gloves were used. Ultrasound guidance: relevant anatomy identified, needle position confirmed, local anesthetic spread visualized around nerve(s)., vascular puncture avoided.  Image printed for medical record. Negative aspiration and negative test dose prior to incremental administration of local anesthetic. The patient tolerated the procedure well.    NO PICTURE HAIKU DOWN

## 2020-06-21 NOTE — Transfer of Care (Signed)
Immediate Anesthesia Transfer of Care Note  Patient: Jackie Chavez  Procedure(s) Performed: Open Reduction Internal Fixation (ORIF) Left Ankle Trimalleolar Fracture with Syndesmosis (Left Ankle)  Patient Location: PACU  Anesthesia Type:General and Regional  Level of Consciousness: drowsy and patient cooperative  Airway & Oxygen Therapy: Patient Spontanous Breathing and Patient connected to face mask oxygen  Post-op Assessment: Report given to RN and Post -op Vital signs reviewed and stable  Post vital signs: Reviewed and stable  Last Vitals:  Vitals Value Taken Time  BP    Temp    Pulse    Resp    SpO2      Last Pain:  Vitals:   06/21/20 1147  TempSrc: Oral  PainSc: 6       Patients Stated Pain Goal: 6 (06/21/20 1147)  Complications: No complications documented.

## 2020-06-21 NOTE — Anesthesia Procedure Notes (Signed)
Procedure Name: LMA Insertion Date/Time: 06/21/2020 1:25 PM Performed by: Marny Lowenstein, CRNA Pre-anesthesia Checklist: Patient identified, Emergency Drugs available, Suction available and Patient being monitored Patient Re-evaluated:Patient Re-evaluated prior to induction Oxygen Delivery Method: Circle system utilized Preoxygenation: Pre-oxygenation with 100% oxygen Induction Type: IV induction Ventilation: Mask ventilation without difficulty LMA: LMA inserted LMA Size: 4.0 Number of attempts: 1 Placement Confirmation: positive ETCO2 and breath sounds checked- equal and bilateral Tube secured with: Tape Dental Injury: Teeth and Oropharynx as per pre-operative assessment

## 2020-06-21 NOTE — Progress Notes (Signed)
Assisted Dr. Oddono with left, ultrasound guided, popliteal, adductor canal block. Side rails up, monitors on throughout procedure. See vital signs in flow sheet. Tolerated Procedure well. 

## 2020-06-21 NOTE — Anesthesia Procedure Notes (Addendum)
Anesthesia Regional Block: Popliteal block   Pre-Anesthetic Checklist: ,, timeout performed, Correct Patient, Correct Site, Correct Laterality, Correct Procedure, Correct Position, site marked, Risks and benefits discussed,  Surgical consent,  Pre-op evaluation,  At surgeon's request and post-op pain management  Laterality: Left  Prep: chloraprep       Needles:  Injection technique: Single-shot  Needle Type: Echogenic Stimulator Needle     Needle Length: 5cm  Needle Gauge: 22     Additional Needles:   Procedures:, nerve stimulator,,, ultrasound used (permanent image in chart),,,,   Nerve Stimulator or Paresthesia:  Response: foot, 0.45 mA,   Additional Responses:   Narrative:  Start time: 06/21/2020 12:00 PM End time: 06/21/2020 12:10 PM Injection made incrementally with aspirations every 5 mL.  Performed by: Personally  Anesthesiologist: Bethena Midget, MD  Additional Notes: Functioning IV was confirmed and monitors were applied.  A 33mm 22ga Arrow echogenic stimulator needle was used. Sterile prep and drape,hand hygiene and sterile gloves were used. Ultrasound guidance: relevant anatomy identified, needle position confirmed, local anesthetic spread visualized around nerve(s)., vascular puncture avoided.  Image printed for medical record. Negative aspiration and negative test dose prior to incremental administration of local anesthetic. The patient tolerated the procedure well.      NO PICTURE AVAILABLE HAIKU DOWN

## 2020-06-21 NOTE — Op Note (Signed)
06/21/2020  3:13 PM  PATIENT:  Jackie Chavez  25 y.o. female  PRE-OPERATIVE DIAGNOSIS:  left ankle trimalleolar fracture  POST-OPERATIVE DIAGNOSIS: 1.  left ankle trimalleolar fracture      2.  Left ankle syndesmosis disruption  Procedure(s): 1.  Open treatment of left ankle trimalleolar fracture with internal fixation including fixation of the posterior lip 2.  Stress examination of the left ankle under fluoroscopy 3.  Open treatment of left ankle syndesmosis disruption with internal fixation 4.  AP, mortise and lateral radiographs of the left ankle  SURGEON:  Toni Arthurs, MD  ASSISTANT: Alfredo Martinez, PA-C  ANESTHESIA:   General, regional  EBL:  minimal   TOURNIQUET:   Total Tourniquet Time Documented: Thigh (Left) - 80 minutes Total: Thigh (Left) - 80 minutes   COMPLICATIONS:  None apparent  DISPOSITION:  Extubated, awake and stable to recovery.  INDICATION FOR PROCEDURE: The patient is a 25 year old female with past medical history significant for smoking.  She injured her left ankle last week when she fell on ice.  She sustained a fracture dislocation of the ankle and underwent closed reduction in the emergency room.  A CT scan reveals a displaced trimalleolar ankle fracture.  She presents now for operative treatment of this displaced and unstable left ankle injury.  The risks and benefits of the alternative treatment options have been discussed in detail.  The patient wishes to proceed with surgery and specifically understands risks of bleeding, infection, nerve damage, blood clots, need for additional surgery, amputation and death.  PROCEDURE IN DETAIL: After preoperative consent was obtained the correct operative site was identified, the patient was brought to the operating room and placed supine on the operating table.  Preoperative antibiotics were administered.  General anesthesia was administered.  Surgical timeout was taken.  The patient was then turned into the  lateral decubitus position with the left side up using a beanbag.  The left lower extremity was then prepped and draped in standard sterile fashion with a tourniquet around the thigh.  The extremity was elevated and the tourniquet was inflated to 250 mmHg.  A posterior lateral incision was made at the ankle joint.  Dissection was carried down through the subcutaneous tissues taking care to protect branches of the sural nerve.  The interval between the flexor houses longus and peroneal tendons was developed.  Subcutaneous dissection was carried over to the fibula.  The periosteum was incised and elevated exposing the fibula fracture.  The fracture was cleaned of all hematoma.  There was some comminution noted anteriorly at the level of the syndesmosis, and the syndesmosis appeared disrupted grossly.  The fibular fracture was opened and used to access the posterior malleolus fracture.  It was cleaned of all hematoma as well.  The posterior malleolus fragment was reduced under direct vision through the posterior lateral approach.  It was provisionally pinned.  Radiographs confirmed appropriate reduction of the fragment.  A 3.5 mm fully threaded solid screw with a washer was placed at the apex of the fracture as a buttress against cranial displacement.  The guidepin was then overdrilled and a 4 mm partially-threaded cannulated screw inserted.  This compressed the fracture site appropriately.  Attention was turned to the lateral malleolus.  The comminuted anterior fragment was reduced and held with a tenaculum.  The primary fracture line was then reduced and held with a lobster claw clamp.  A 3.5 mm fully threaded lag screw was inserted from posterior to anterior across the fracture  site.  The screw was noted to have adequate purchase.  A 7 hole one third tubular plate was then contoured to fit the lateral malleolus.  It was secured proximally with 3 bicortical screws and distally with 2 unicortical screws.  AP and  lateral radiographs confirmed appropriate reduction of the fibula fracture in appropriate position and length of all screws.  Attention was turned to the medial malleolus where a longitudinal incision was made.  Dissection was carried down through the subcutaneous tissues.  The fracture site was cleaned of all hematoma.  It was reduced and held with a pointed tenaculum.  Radiographs confirmed the reduction.  2 K wires were inserted across the fracture site.  Both were overdrilled.  4 mm x 40 mm partially-threaded cannulated screws were inserted.  These were noted to compress the fracture site appropriately.  AP, mortise and lateral radiographs of the left ankle confirmed appropriate position and length of all hardware and appropriate reduction of the medial, lateral and posterior malleolus fractures.  A stress examination was then performed.  Dorsiflexion and external rotation stress was applied to the supinated forefoot.  A mortise view was visualized on live fluoroscopy.  There was widening at the syndesmosis indicating instability.  The decision was made at that time to proceed with open treatment and internal fixation.  A drill hole was made through the third most distal hole in the plate.  This lined up with the physeal scar of the distal tibia.  All 4 cortices were drilled.  A [employer] zip tight suture button device was inserted and toggled at the medial cortex.  The syndesmosis was reduced under direct vision and the suture button tightened appropriately.  Final AP, mortise and lateral radiographs confirmed appropriate position and length of all hardware and appropriate reduction of the syndesmosis.  Medial and lateral wounds were then irrigated copiously and sprinkled with vancomycin powder.  Subcutaneous tissues were approximated with Vicryl.  Skin incisions were closed with nylon.  Sterile dressings were applied followed by a well-padded short leg splint.  The tourniquet was released after  application of dressings.  The patient was awakened from anesthesia and transported to the recovery room in stable condition.   FOLLOW UP PLAN: Nonweightbearing on the left lower extremity.  Follow-up in the office in 2 weeks for suture removal and conversion to a short leg cast.  Xarelto for DVT prophylaxis given her history of smoking and obesity.   RADIOGRAPHS: AP, lateral and mortise radiographs of the left ankle show interval reduction and fixation of the medial, lateral and posterior malleolus fractures and syndesmosis.  Hardware is appropriately positioned and of the appropriate lengths.    Alfredo Martinez PA-C was present and scrubbed for the duration of the operative case. His assistance was essential in positioning the patient, prepping and draping, gaining and maintaining exposure, performing the operation, closing and dressing the wounds and applying the splint.

## 2020-06-21 NOTE — Anesthesia Preprocedure Evaluation (Addendum)
Anesthesia Evaluation  Patient identified by MRN, date of birth, ID band Patient awake    Reviewed: Allergy & Precautions, H&P , NPO status , Patient's Chart, lab work & pertinent test results, reviewed documented beta blocker date and time   Airway Mallampati: I  TM Distance: >3 FB Neck ROM: full    Dental no notable dental hx. (+) Teeth Intact, Dental Advisory Given   Pulmonary neg pulmonary ROS, Current Smoker,    Pulmonary exam normal breath sounds clear to auscultation       Cardiovascular Exercise Tolerance: Good hypertension, negative cardio ROS   Rhythm:regular Rate:Normal     Neuro/Psych  Headaches, PSYCHIATRIC DISORDERS Anxiety    GI/Hepatic negative GI ROS, Neg liver ROS,   Endo/Other  Morbid obesity  Renal/GU negative Renal ROS  negative genitourinary   Musculoskeletal   Abdominal   Peds  Hematology negative hematology ROS (+)   Anesthesia Other Findings   Reproductive/Obstetrics negative OB ROS                             Anesthesia Physical Anesthesia Plan  ASA: II  Anesthesia Plan: MAC and Regional   Post-op Pain Management:    Induction: Intravenous  PONV Risk Score and Plan: 2 and Ondansetron and TIVA  Airway Management Planned: Mask, Natural Airway and Nasal Cannula  Additional Equipment:   Intra-op Plan:   Post-operative Plan:   Informed Consent: I have reviewed the patients History and Physical, chart, labs and discussed the procedure including the risks, benefits and alternatives for the proposed anesthesia with the patient or authorized representative who has indicated his/her understanding and acceptance.     Dental Advisory Given  Plan Discussed with: CRNA and Anesthesiologist  Anesthesia Plan Comments:        Anesthesia Quick Evaluation

## 2020-06-21 NOTE — Discharge Instructions (Signed)
Jackie Arthurs, MD [facility]  Please read the following information regarding your care after surgery.  Medications  You only need a prescription for the narcotic pain medicine (ex. oxycodone, Percocet, Norco).  All of the other medicines listed below are available over the counter. X Aleve 2 pills twice a day for the first 3 days after surgery. X acetominophen (Tylenol) 650 mg every 4-6 hours as you need for minor to moderate pain X oxycodone as prescribed for severe pain  Narcotic pain medicine (ex. oxycodone, Percocet, Vicodin) will cause constipation.  To prevent this problem, take the following medicines while you are taking any pain medicine. X docusate sodium (Colace) 100 mg twice a day X senna (Senokot) 2 tablets twice a day  X To help prevent blood clots, take Xarelto as prescribed for two weeks after surgery.  You should also get up every hour while you are awake to move around.    Weight Bearing X Do not bear any weight on the operated leg or foot.  Cast / Splint / Dressing X Keep your splint, cast or dressing clean and dry.  Don't put anything (coat hanger, pencil, etc) down inside of it.  If it gets damp, use a hair dryer on the cool setting to dry it.  If it gets soaked, call the office to schedule an appointment for a cast change.   After your dressing, cast or splint is removed; you may shower, but do not soak or scrub the wound.  Allow the water to run over it, and then gently pat it dry.  Swelling It is normal for you to have swelling where you had surgery.  To reduce swelling and pain, keep your toes above your nose for at least 3 days after surgery.  It may be necessary to keep your foot or leg elevated for several weeks.  If it hurts, it should be elevated.  Follow Up Call my office at [phone] when you are discharged from the hospital or surgery center to schedule an appointment to be seen two weeks after surgery.  Call my office at [phone] if you develop  a fever >101.5 F, nausea, vomiting, bleeding from the surgical site or severe pain.      Regional Anesthesia Blocks  1. Numbness or the inability to move the "blocked" extremity may last from 3-48 hours after placement. The length of time depends on the medication injected and your individual response to the medication. If the numbness is not going away after 48 hours, call your surgeon.  2. The extremity that is blocked will need to be protected until the numbness is gone and the  Strength has returned. Because you cannot feel it, you will need to take extra care to avoid injury. Because it may be weak, you may have difficulty moving it or using it. You may not know what position it is in without looking at it while the block is in effect.  3. For blocks in the legs and feet, returning to weight bearing and walking needs to be done carefully. You will need to wait until the numbness is entirely gone and the strength has returned. You should be able to move your leg and foot normally before you try and bear weight or walk. You will need someone to be with you when you first try to ensure you do not fall and possibly risk injury.  4. Bruising and tenderness at the needle site are common side effects and will resolve in a few  days.  5. Persistent numbness or new problems with movement should be communicated to the surgeon or the [facility] [phone] [facility] [phone]).   Information for Discharge Teaching: EXPAREL (bupivacaine liposome injectable suspension)   Your surgeon or anesthesiologist gave you EXPAREL(bupivacaine) to help control your pain after surgery.   EXPAREL is a local anesthetic that provides pain relief by numbing the tissue around the surgical site.  EXPAREL is designed to release pain medication over time and can control pain for up to 72 hours.  Depending on how you respond to EXPAREL, you may require less pain medication during your  recovery.  Possible side effects:  Temporary loss of sensation or ability to move in the area where bupivacaine was injected.  Nausea, vomiting, constipation  Rarely, numbness and tingling in your mouth or lips, lightheadedness, or anxiety may occur.  Call your doctor right away if you think you may be experiencing any of these sensations, or if you have other questions regarding possible side effects.  Follow all other discharge instructions given to you by your surgeon or nurse. Eat a healthy diet and drink plenty of water or other fluids.  If you return to the hospital for any reason within 96 hours following the administration of EXPAREL, it is important for health care providers to know that you have received this anesthetic. A teal colored band has been placed on your arm with the date, time and amount of EXPAREL you have received in order to alert and inform your health care providers. Please leave this armband in place for the full 96 hours following administration, and then you may remove the band.   Post Anesthesia Home Care Instructions  Activity: Get plenty of rest for the remainder of the day. A responsible individual must stay with you for 24 hours following the procedure.  For the next 24 hours, DO NOT: -Drive a car -Advertising copywriter -Drink alcoholic beverages -Take any medication unless instructed by your physician -Make any legal decisions or sign important papers.  Meals: Start with liquid foods such as gelatin or soup. Progress to regular foods as tolerated. Avoid greasy, spicy, heavy foods. If nausea and/or vomiting occur, drink only clear liquids until the nausea and/or vomiting subsides. Call your physician if vomiting continues.  Special Instructions/Symptoms: Your throat may feel dry or sore from the anesthesia or the breathing tube placed in your throat during surgery. If this causes discomfort, gargle with warm salt water. The discomfort should disappear  within 24 hours.  If you had a scopolamine patch placed behind your ear for the management of post- operative nausea and/or vomiting:  1. The medication in the patch is effective for 72 hours, after which it should be removed.  Wrap patch in a tissue and discard in the trash. Wash hands thoroughly with soap and water. 2. You may remove the patch earlier than 72 hours if you experience unpleasant side effects which may include dry mouth, dizziness or visual disturbances. 3. Avoid touching the patch. Wash your hands with soap and water after contact with the patch.

## 2020-06-21 NOTE — H&P (Signed)
Jackie Chavez is an 25 y.o. female.   Chief Complaint: Left ankle pain HPI: The patient is a 25 year old female who fell last week injuring her left ankle.  She has a displaced and unstable trimalleolar fracture and presents now for operative treatment.  Past Medical History:  Diagnosis Date  . Anemia   . Anxiety   . BV (bacterial vaginosis)   . Headache    migraines  . HTN (hypertension)   . Hx of chlamydia infection 10/17/2016  . Pregnancy induced hypertension    preeclampsia    Past Surgical History:  Procedure Laterality Date  . CESAREAN SECTION MULTI-GESTATIONAL N/A 05/05/2017   Procedure: CESAREAN SECTION MULTI-GESTATIONAL;  Surgeon: Catalina Antigua, MD;  Location: [facility];  Service: Obstetrics;  Laterality: N/A;  . WOUND DEBRIDEMENT N/A 05/10/2017   Procedure: DEBRIDEMENT ABDOMINAL WOUND;  Surgeon: [name], MD;  Location: [facility];  Service: Gynecology;  Laterality: N/A;  physcian is requesting a wound vac    Family History  Problem Relation Age of Onset  . COPD Mother   . Diabetes Mother   . Mental illness Mother   . Alcohol abuse Mother   . Drug abuse Mother   . Diabetes Father   . Hypertension Father   . Alcohol abuse Father   . Drug abuse Father    Social History:  reports that she has been smoking cigarettes. She has never used smokeless tobacco. She reports that she does not drink alcohol and does not use drugs.  Allergies:  Allergies  Allergen Reactions  . Coconut Flavor Anaphylaxis  . Latex Swelling and Rash    Had swelling from foley cath and irritation     Medications Prior to Admission  Medication Sig Dispense Refill  . ibuprofen (ADVIL) 800 MG tablet Take 1 tablet (800 mg total) by mouth every 8 (eight) hours as needed. 60 tablet 0  . oxyCODONE (ROXICODONE) 5 MG immediate release tablet Take 1 tablet (5 mg total) by mouth every 4 (four) hours as needed for severe pain. 10 tablet 0    Results for orders placed or performed  during the hospital encounter of 06/21/20 (from the past 48 hour(s))  SARS Coronavirus 2 by RT PCR (hospital order, performed in [facility] : [facility] hospital lab) Nasopharyngeal Nasopharyngeal Swab     Status: None   Collection Time: 06/21/20  9:19 AM   Specimen: Nasopharyngeal Swab  Result Value Ref Range   SARS Coronavirus 2 NEGATIVE NEGATIVE    Comment: (NOTE) SARS-CoV-2 target nucleic acids are NOT DETECTED.  The SARS-CoV-2 RNA is generally detectable in upper and lower respiratory specimens during the acute phase of infection. The lowest concentration of SARS-CoV-2 viral copies this assay can detect is 250 copies / mL. A negative result does not preclude SARS-CoV-2 infection and should not be used as the sole basis for treatment or other patient management decisions.  A negative result may occur with improper specimen collection / handling, submission of specimen other than nasopharyngeal swab, presence of viral mutation(s) within the areas targeted by this assay, and inadequate number of viral copies (<250 copies / mL). A negative result must be combined with clinical observations, patient history, and epidemiological information.  Fact Sheet for Patients:   BoilerBrush.com.cy  Fact Sheet for Healthcare Providers: https://pope.com/  This test is not yet approved or  cleared by the [place] FDA and has been authorized for detection and/or diagnosis of SARS-CoV-2 by FDA under an Emergency Use Authorization (EUA).  This EUA  will remain in effect (meaning this test can be used) for the duration of the COVID-19 declaration under Section 564(b)(1) of the Act, 21 U.S.C. section 360bbb-3(b)(1), unless the authorization is terminated or revoked sooner.  Performed at [facility], [address] [place], [place] [address]   Pregnancy, urine POC     Status: None   Collection Time: July 15, 2020 11:41 AM  Result Value Ref Range   Preg  Test, Ur NEGATIVE NEGATIVE    Comment:        THE SENSITIVITY OF THIS METHODOLOGY IS >24 mIU/mL    No results found.  Review of Systems no recent fever, chills, nausea, vomiting or changes in her appetite  Blood pressure 116/85, pulse 93, temperature 97.9 F (36.6 C), temperature source Oral, resp. rate 19, height 5\' 1"  (1.549 m), weight 93.8 kg, last menstrual period 06/10/2020, SpO2 99 %, not currently breastfeeding. Physical Exam  Well-nourished well-developed woman in no apparent distress.  Alert and oriented x4.  Normal mood and affect.  Gait is nonweightbearing on the left.  Left ankle is swollen with ecchymosis medially and laterally.  Pulses are palpable in the foot.  Normal sensibility to light touch dorsally and plantarly at the forefoot.  Active plantar flexion and dorsiflexion strength at the toes.   Assessment/Plan Left ankle trimalleolar fracture -to the operating room today for open treatment with internal fixation.  The risks and benefits of the alternative treatment options have been discussed in detail.  The patient wishes to proceed with surgery and specifically understands risks of bleeding, infection, nerve damage, blood clots, need for additional surgery, amputation and death.   06/12/2020, MD 15-Jul-2020, 1:13 PM

## 2020-06-22 ENCOUNTER — Encounter (HOSPITAL_BASED_OUTPATIENT_CLINIC_OR_DEPARTMENT_OTHER): Payer: Self-pay | Admitting: Orthopedic Surgery

## 2020-06-22 NOTE — Progress Notes (Signed)
Corneal abrasion nformation sheet sent to patient per Dr Mercy Riding request. Ketorolac 0.5% ophthalmic solution prescription called in to [facility] on [address] per Dr Roni Bread instructions, along with BSS or Refresh or similar saline drops for use in right eye.

## 2020-06-22 NOTE — Progress Notes (Signed)
Addendum to post op note. When the OP RN placed a phone call to Jackie Chavez this am, she told the RN she had blurring of vision of her left eye, redness and a foreign body sensation. I called Jackie Chavez and confirmed that this was indeed the case. I suspect she may have a corneal abrasion. She states that her symptoms seem to be improving slowly. I called in a prescription for Ketorolac eye gtts and balanced salt solution for her to use for the next 48 hours. I explained to her what I thought she had and that in most cases her symptoms should resolve over the next 24-72 hours. Will follow up with patient tomorrow to make sure she is improving.

## 2020-06-22 NOTE — Progress Notes (Signed)
Patient returned post-op phone call at 09:52am 06/22/2020. Patient reports pain and redness to left eye. Patient also reports blurred vision to left eye. Patient became aware of symptoms yesterday after arriving home and decided to try to "sleep it off," however symptoms persist today. Dr. Malen Gauze made aware and call forwarded to him to evaluate patient's symptoms.

## 2020-06-22 NOTE — Anesthesia Postprocedure Evaluation (Signed)
Anesthesia Post Note  Patient: Jackie Chavez  Procedure(s) Performed: Open Reduction Internal Fixation (ORIF) Left Ankle Trimalleolar Fracture with Syndesmosis (Left Ankle)     Patient location during evaluation: PACU Anesthesia Type: Regional and General Level of consciousness: awake and alert Pain management: pain level controlled Vital Signs Assessment: post-procedure vital signs reviewed and stable Respiratory status: spontaneous breathing, nonlabored ventilation, respiratory function stable and patient connected to nasal cannula oxygen Cardiovascular status: blood pressure returned to baseline and stable Postop Assessment: no apparent nausea or vomiting Anesthetic complications: no   No complications documented.  Last Vitals:  Vitals:   06/21/20 1530 06/21/20 1600  BP: 131/77 (!) 155/79  Pulse: (!) 121 (!) 107  Resp: 19 16  Temp:  36.6 C  SpO2: 100% 100%    Last Pain:  Vitals:   06/21/20 1600  TempSrc:   PainSc: 0-No pain                 Narely Nobles

## 2020-06-24 NOTE — Progress Notes (Signed)
Spoke with patient regarding LEFT eye pain and she states that it has resolved without issues. Her main concern is ankle pain,  I counseled her to take the motrin around the clock with the oxycodone and to call Dr. Victorino Dike should she have any significant toe discoloration.

## 2020-06-24 NOTE — Progress Notes (Signed)
Follow up for post op LEFT eye pain completely resolved.

## 2020-07-04 DIAGNOSIS — S82852A Displaced trimalleolar fracture of left lower leg, initial encounter for closed fracture: Secondary | ICD-10-CM | POA: Diagnosis not present

## 2020-07-04 DIAGNOSIS — Z4889 Encounter for other specified surgical aftercare: Secondary | ICD-10-CM | POA: Diagnosis not present

## 2020-08-12 ENCOUNTER — Other Ambulatory Visit: Payer: Self-pay

## 2020-08-12 ENCOUNTER — Ambulatory Visit
Admission: EM | Admit: 2020-08-12 | Discharge: 2020-08-12 | Disposition: A | Discharge status: Home / Self Care | Payer: Medicaid Other | Attending: Emergency Medicine | Admitting: Emergency Medicine

## 2020-08-12 DIAGNOSIS — M25572 Pain in left ankle and joints of left foot: Secondary | ICD-10-CM

## 2020-08-12 DIAGNOSIS — M25472 Effusion, left ankle: Secondary | ICD-10-CM

## 2020-08-12 MED ORDER — IBUPROFEN 800 MG PO TABS: 800.0000 mg | ORAL_TABLET | Freq: Three times a day (TID) | ORAL | 0 refills | Status: AC | PRN

## 2020-08-12 NOTE — ED Triage Notes (Signed)
Pt present left foot swelling and pain. Pt states she is unable to bear weight on her left foot. Pt recently had surgery and has been cleared to go back to work

## 2020-08-12 NOTE — Discharge Instructions (Addendum)
Take the ibuprofen as prescribed.  Rest and elevate your ankle.  Apply ice packs 2-3 times a day for up to 20 minutes each.  Wear the Ace wrap as needed for comfort.    Follow up with your orthopedist.

## 2020-08-12 NOTE — ED Provider Notes (Signed)
[facility]    CSN: [id number] Arrival date & time: 08/12/20  1536      History   Chief Complaint Chief Complaint  Patient presents with  . Foot Swelling  . Dizziness    HPI Jackie Chavez is a 25 y.o. female.   Patient presents with pain and swelling of her left foot and ankle x1 month; worse today.  No recent falls or injury.  She had ORIF of the left ankle on 06/21/2020; She has not seen the orthopedist recently.  She states she was supposed to leave her cast on for 1 month but took it off a few days early.  She denies numbness, weakness, paresthesias, open wounds, or other symptoms.  Her medical history includes hypertension, anemia, anxiety.  The history is provided by the patient and medical records.    Past Medical History:  Diagnosis Date  . Anemia   . Anxiety   . BV (bacterial vaginosis)   . Headache    migraines  . HTN (hypertension)   . Hx of chlamydia infection 10/17/2016  . Pregnancy induced hypertension    preeclampsia    Patient Active Problem List   Diagnosis Date Noted  . Normal labor 02/20/2019  . Limited prenatal care in third trimester 02/19/2019  . Supervision of high risk pregnancy, antepartum 10/29/2018  . History of cesarean section 10/29/2018  . Chronic hypertension 10/23/2018    Past Surgical History:  Procedure Laterality Date  . CESAREAN SECTION MULTI-GESTATIONAL N/A 05/05/2017   Procedure: CESAREAN SECTION MULTI-GESTATIONAL;  Surgeon: Catalina Antigua, MD;  Location: [facility];  Service: Obstetrics;  Laterality: N/A;  . ORIF ANKLE FRACTURE Left 06/21/2020   Procedure: Open Reduction Internal Fixation (ORIF) Left Ankle Trimalleolar Fracture with Syndesmosis;  Surgeon: Toni Arthurs, MD;  Location: [facility];  Service: Orthopedics;  Laterality: Left;  . WOUND DEBRIDEMENT N/A 05/10/2017   Procedure: DEBRIDEMENT ABDOMINAL WOUND;  Surgeon: [name], MD;  Location: [facility];  Service: Gynecology;   Laterality: N/A;  physcian is requesting a wound vac    OB History    Gravida  3   Para  3   Term  1   Preterm  1   AB      Living  3     SAB      IAB      Ectopic      Multiple  1   Live Births  3            Home Medications    Prior to Admission medications   Medication Sig Start Date End Date Taking? Authorizing Provider  ibuprofen (ADVIL) 800 MG tablet Take 1 tablet (800 mg total) by mouth every 8 (eight) hours as needed. 08/12/20  Yes Mickie Bail, NP  docusate sodium (COLACE) 100 MG capsule Take 1 capsule (100 mg total) by mouth 2 (two) times daily. While taking narcotic pain medicine. 06/21/20   Jacinta Shoe, PA-C  senna (SENOKOT) 8.6 MG TABS tablet Take 2 tablets (17.2 mg total) by mouth 2 (two) times daily. 06/21/20   Jacinta Shoe, PA-C  rivaroxaban (XARELTO) 10 MG TABS tablet Take 1 tablet (10 mg total) by mouth daily. 06/21/20 08/12/20  Jacinta Shoe, PA-C    Family History Family History  Problem Relation Age of Onset  . COPD Mother   . Diabetes Mother   . Mental illness Mother   . Alcohol abuse Mother   . Drug abuse Mother   .  Diabetes Father   . Hypertension Father   . Alcohol abuse Father   . Drug abuse Father     Social History Social History   Tobacco Use  . Smoking status: Current Every Day Smoker    Types: Cigarettes  . Smokeless tobacco: Never Used  Vaping Use  . Vaping Use: Never used  Substance Use Topics  . Alcohol use: No  . Drug use: No     Allergies   Coconut flavor and Latex   Review of Systems Review of Systems  Constitutional: Negative for chills and fever.  HENT: Negative for ear pain and sore throat.   Eyes: Negative for pain and visual disturbance.  Respiratory: Negative for cough and shortness of breath.   Cardiovascular: Negative for chest pain and palpitations.  Gastrointestinal: Negative for abdominal pain and vomiting.  Genitourinary: Negative for dysuria and hematuria.   Musculoskeletal: Positive for arthralgias, gait problem and joint swelling.  Skin: Negative for color change, rash and wound.  Neurological: Negative for syncope, weakness and numbness.  All other systems reviewed and are negative.    Physical Exam Triage Vital Signs ED Triage Vitals  Enc Vitals Group     BP      Pulse      Resp      Temp      Temp src      SpO2      Weight      Height      Head Circumference      Peak Flow      Pain Score      Pain Loc      Pain Edu?      Excl. in GC?    No data found.  Updated Vital Signs BP 128/90 (BP Location: Left Arm)   Pulse 92   Temp (!) 97.5 F (36.4 C) (Oral)   Resp 18   LMP 07/29/2020   SpO2 98%   Visual Acuity Right Eye Distance:   Left Eye Distance:   Bilateral Distance:    Right Eye Near:   Left Eye Near:    Bilateral Near:     Physical Exam Vitals and nursing note reviewed.  Constitutional:      General: She is not in acute distress.    Appearance: She is well-developed.  HENT:     Head: Normocephalic and atraumatic.     Mouth/Throat:     Mouth: Mucous membranes are moist.  Eyes:     Conjunctiva/sclera: Conjunctivae normal.  Cardiovascular:     Rate and Rhythm: Normal rate and regular rhythm.     Heart sounds: Normal heart sounds.  Pulmonary:     Effort: Pulmonary effort is normal. No respiratory distress.     Breath sounds: Normal breath sounds.  Abdominal:     Palpations: Abdomen is soft.     Tenderness: There is no abdominal tenderness.  Musculoskeletal:        General: Swelling present. No tenderness.     Cervical back: Neck supple.     Comments: Left ankle edematous; nontender; no open wounds or erythema.  Skin:    General: Skin is warm and dry.     Findings: No bruising, erythema, lesion or rash.  Neurological:     General: No focal deficit present.     Mental Status: She is alert and oriented to person, place, and time.     Sensory: No sensory deficit.     Motor: No weakness.  Gait: Gait normal.  Psychiatric:        Mood and Affect: Mood normal.        Behavior: Behavior normal.      UC Treatments / Results  Labs (all labs ordered are listed, but only abnormal results are displayed) Labs Reviewed - No data to display  EKG   Radiology No results found.  Procedures Procedures (including critical care time)  Medications Ordered in UC Medications - No data to display  Initial Impression / Assessment and Plan / UC Course  I have reviewed the triage vital signs and the nursing notes.  Pertinent labs & imaging results that were available during my care of the patient were reviewed by me and considered in my medical decision making (see chart for details).   Left ankle pain and swelling.  Work note provided per patient request.  Treating with ibuprofen, rest, elevation, ice packs, Ace wrap.  Instructed patient to follow-up with her orthopedist as soon as possible.  She agrees to plan of care.   Final Clinical Impressions(s) / UC Diagnoses   Final diagnoses:  Acute left ankle pain  Left ankle swelling     Discharge Instructions     Take the ibuprofen as prescribed.  Rest and elevate your ankle.  Apply ice packs 2-3 times a day for up to 20 minutes each.  Wear the Ace wrap as needed for comfort.    Follow up with your orthopedist.       ED Prescriptions    Medication Sig Dispense Auth. Provider   ibuprofen (ADVIL) 800 MG tablet Take 1 tablet (800 mg total) by mouth every 8 (eight) hours as needed. 21 tablet Mickie Bail, NP     PDMP not reviewed this encounter.   Mickie Bail, NP 08/12/20 [phone]

## 2020-08-17 ENCOUNTER — Emergency Department (HOSPITAL_COMMUNITY)
Admission: EM | Admit: 2020-08-17 | Discharge: 2020-08-17 | Disposition: A | Discharge status: Home / Self Care | Payer: Medicaid Other | Attending: Emergency Medicine | Admitting: Emergency Medicine

## 2020-08-17 ENCOUNTER — Encounter (HOSPITAL_COMMUNITY): Payer: Self-pay

## 2020-08-17 DIAGNOSIS — R61 Generalized hyperhidrosis: Secondary | ICD-10-CM | POA: Insufficient documentation

## 2020-08-17 DIAGNOSIS — I10 Essential (primary) hypertension: Secondary | ICD-10-CM | POA: Diagnosis not present

## 2020-08-17 DIAGNOSIS — Z5321 Procedure and treatment not carried out due to patient leaving prior to being seen by health care provider: Secondary | ICD-10-CM | POA: Insufficient documentation

## 2020-08-17 DIAGNOSIS — R10817 Generalized abdominal tenderness: Secondary | ICD-10-CM | POA: Diagnosis not present

## 2020-08-17 DIAGNOSIS — R55 Syncope and collapse: Secondary | ICD-10-CM | POA: Diagnosis not present

## 2020-08-17 DIAGNOSIS — R1084 Generalized abdominal pain: Secondary | ICD-10-CM | POA: Diagnosis not present

## 2020-08-17 DIAGNOSIS — R000 Tachycardia, unspecified: Secondary | ICD-10-CM | POA: Diagnosis not present

## 2020-08-17 LAB — I-STAT BETA HCG BLOOD, ED (MC, WL, AP ONLY): I-stat hCG, quantitative: <5 m[IU]/mL (ref <5)

## 2020-08-17 LAB — CBC
HCT: 43.3 % (ref 36.0–46.0)
Hemoglobin: 13.4 g/dL (ref 12.0–15.0)
MCH: 28.9 pg (ref 26.0–34.0)
MCHC: 30.9 g/dL (ref 30.0–36.0)
MCV: 93.5 fL (ref 80.0–100.0)
Platelets: 178 10*3/uL (ref 150–400)
RBC: 4.63 MIL/uL (ref 3.87–5.11)
RDW: 14.9 % (ref 11.5–15.5)
WBC: 6.1 10*3/uL (ref 4.0–10.5)
nRBC: 0 % (ref 0.0–0.2)

## 2020-08-17 LAB — COMPREHENSIVE METABOLIC PANEL
ALT: 14 U/L (ref 0–44)
AST: 20 U/L (ref 15–41)
Albumin: 3.8 g/dL (ref 3.5–5.0)
Alkaline Phosphatase: 62 U/L (ref 38–126)
Anion gap: 9 (ref 5–15)
BUN: 17 mg/dL (ref 6–20)
CO2: 16 mmol/L — ABNORMAL LOW (ref 22–32)
Calcium: 8.2 mg/dL — ABNORMAL LOW (ref 8.9–10.3)
Chloride: 111 mmol/L (ref 98–111)
Creatinine, Ser: 0.7 mg/dL (ref 0.44–1.00)
GFR, Estimated: >60 mL/min (ref >60)
Glucose, Bld: 85 mg/dL (ref 70–99)
Potassium: 4.2 mmol/L (ref 3.5–5.1)
Sodium: 136 mmol/L (ref 135–145)
Total Bilirubin: 1.2 mg/dL (ref 0.3–1.2)
Total Protein: 7.1 g/dL (ref 6.5–8.1)

## 2020-08-17 LAB — LIPASE, BLOOD: Lipase: 22 U/L (ref 11–51)

## 2020-08-17 LAB — CBG MONITORING, ED: Glucose-Capillary: 84 mg/dL (ref 70–99)

## 2020-08-17 NOTE — ED Triage Notes (Signed)
Pt arrived via EMS, was at work, felt hot, diaphoretic, near syncope. Also c/o diffuse abd pain x3 days.   20G L hand 500cc NS en route.
